# Patient Record
Sex: Female | Born: 1935 | Race: White | Hispanic: No | Smoking: Former smoker
Health system: Southern US, Community
[De-identification: ages and names within clinical notes are randomized; demographics above are authoritative.]

## PROBLEM LIST (undated history)

## (undated) DIAGNOSIS — J449 Chronic obstructive pulmonary disease, unspecified: Secondary | ICD-10-CM

## (undated) DIAGNOSIS — F329 Major depressive disorder, single episode, unspecified: Secondary | ICD-10-CM

## (undated) DIAGNOSIS — G459 Transient cerebral ischemic attack, unspecified: Secondary | ICD-10-CM

## (undated) DIAGNOSIS — K219 Gastro-esophageal reflux disease without esophagitis: Secondary | ICD-10-CM

## (undated) DIAGNOSIS — R0902 Hypoxemia: Secondary | ICD-10-CM

## (undated) DIAGNOSIS — F32A Depression, unspecified: Secondary | ICD-10-CM

## (undated) DIAGNOSIS — F419 Anxiety disorder, unspecified: Secondary | ICD-10-CM

## (undated) DIAGNOSIS — E46 Unspecified protein-calorie malnutrition: Secondary | ICD-10-CM

## (undated) DIAGNOSIS — Z9981 Dependence on supplemental oxygen: Secondary | ICD-10-CM

## (undated) DIAGNOSIS — S72143A Displaced intertrochanteric fracture of unspecified femur, initial encounter for closed fracture: Secondary | ICD-10-CM

## (undated) HISTORY — PX: CARDIAC ELECTROPHYSIOLOGY STUDY AND ABLATION: SHX1294

## (undated) HISTORY — PX: APPENDECTOMY: SHX54

## (undated) HISTORY — PX: CERVICAL CONE BIOPSY: SUR198

## (undated) HISTORY — PX: CATARACT EXTRACTION: SUR2

## (undated) HISTORY — PX: TUBAL LIGATION: SHX77

## (undated) HISTORY — DX: Displaced intertrochanteric fracture of unspecified femur, initial encounter for closed fracture: S72.143A

## (undated) HISTORY — DX: Hypoxemia: R09.02

---
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
## 2006-04-03 ENCOUNTER — Ambulatory Visit (HOSPITAL_COMMUNITY): Admission: RE | Admit: 2006-04-03 | Discharge: 2006-04-03 | Payer: Self-pay | Admitting: Obstetrics & Gynecology

## 2013-07-22 ENCOUNTER — Other Ambulatory Visit (INDEPENDENT_AMBULATORY_CARE_PROVIDER_SITE_OTHER): Payer: Self-pay | Admitting: *Deleted

## 2013-07-22 ENCOUNTER — Inpatient Hospital Stay (HOSPITAL_COMMUNITY): Admission: RE | Admit: 2013-07-22 | Payer: Self-pay | Source: Ambulatory Visit

## 2013-07-22 ENCOUNTER — Telehealth (INDEPENDENT_AMBULATORY_CARE_PROVIDER_SITE_OTHER): Payer: Self-pay | Admitting: *Deleted

## 2013-07-22 ENCOUNTER — Encounter (HOSPITAL_COMMUNITY): Payer: Self-pay | Admitting: Pharmacy Technician

## 2013-07-22 ENCOUNTER — Encounter (INDEPENDENT_AMBULATORY_CARE_PROVIDER_SITE_OTHER): Payer: Self-pay

## 2013-07-22 DIAGNOSIS — K222 Esophageal obstruction: Secondary | ICD-10-CM

## 2013-07-22 NOTE — Telephone Encounter (Signed)
agree

## 2013-07-22 NOTE — Telephone Encounter (Signed)
  Procedure: egd/ed w/ propofol  per Tyler Deis  Reason/Indication:  Esophageal stricture   Has patient had this procedure before?  Yes, 07/2013  If so, when, by whom and where?    Is there a family history of colon cancer?    Who?  What age when diagnosed?    Is patient diabetic?   no      Does patient have prosthetic heart valve?  no  Do you have a pacemaker?  no  Has patient ever had endocarditis? no  Has patient had joint replacement within last 12 months?  no  Does patient tend to be constipated or take laxatives? no  Is patient on Coumadin, Plavix and/or Aspirin? yes  Medications: asa 81 mg daily, omeprazole daily, Xanax daily, vitamins, mirtazapine daily  Allergies: nkda  Medication Adjustment: asa 2 days  Procedure date & time: 07/24/13 at 730

## 2013-07-23 ENCOUNTER — Encounter (HOSPITAL_COMMUNITY)
Admission: RE | Admit: 2013-07-23 | Discharge: 2013-07-23 | Disposition: A | Discharge status: Home / Self Care | Payer: No Typology Code available for payment source | Source: Ambulatory Visit | Attending: Internal Medicine | Admitting: Internal Medicine

## 2013-07-23 ENCOUNTER — Encounter (HOSPITAL_COMMUNITY): Payer: Self-pay

## 2013-07-23 ENCOUNTER — Other Ambulatory Visit: Payer: Self-pay

## 2013-07-23 VITALS — BP 113/72 | HR 103 | Temp 98.1°F | Resp 22 | Ht 68.0 in | Wt 108.0 lb

## 2013-07-23 DIAGNOSIS — K222 Esophageal obstruction: Secondary | ICD-10-CM

## 2013-07-23 HISTORY — DX: Major depressive disorder, single episode, unspecified: F32.9

## 2013-07-23 HISTORY — DX: Gastro-esophageal reflux disease without esophagitis: K21.9

## 2013-07-23 HISTORY — DX: Dependence on supplemental oxygen: Z99.81

## 2013-07-23 HISTORY — DX: Depression, unspecified: F32.A

## 2013-07-23 HISTORY — DX: Chronic obstructive pulmonary disease, unspecified: J44.9

## 2013-07-23 LAB — BASIC METABOLIC PANEL
BUN: 5 mg/dL — ABNORMAL LOW (ref 6–23)
CO2: 32 mEq/L (ref 19–32)
Calcium: 10.1 mg/dL (ref 8.4–10.5)
Chloride: 97 mEq/L (ref 96–112)
Creatinine, Ser: 0.76 mg/dL (ref 0.50–1.10)
GFR calc Af Amer: >90 mL/min (ref >90)
GFR calc non Af Amer: 79 mL/min — ABNORMAL LOW (ref >90)
Glucose, Bld: 109 mg/dL — ABNORMAL HIGH (ref 70–99)
Potassium: 5.3 mEq/L — ABNORMAL HIGH (ref 3.5–5.1)
Sodium: 137 mEq/L (ref 135–145)

## 2013-07-23 LAB — HEMOGLOBIN AND HEMATOCRIT, BLOOD
HCT: 47.3 % — ABNORMAL HIGH (ref 36.0–46.0)
Hemoglobin: 15.2 g/dL — ABNORMAL HIGH (ref 12.0–15.0)

## 2013-07-23 NOTE — Patient Instructions (Signed)
Taylor Cannon  07/23/2013   Your procedure is scheduled on:  07/24/13  Report to [facility] at 06:15 AM.  Call this number if you have problems the morning of surgery: [phone]   Remember:   Do not eat food or drink liquids after midnight.   Take these medicines the morning of surgery with A SIP OF WATER: Mirtazapine and Omeprazole.   Do not wear jewelry, make-up or nail polish.  Do not wear lotions, powders, or perfumes. You may wear deodorant.  Do not shave 48 hours prior to surgery. Men may shave face and neck.  Do not bring valuables to the hospital.  [facility] is not responsible for any belongings or valuables.               Contacts, dentures or bridgework may not be worn into surgery.  Leave suitcase in the car. After surgery it may be brought to your room.  For patients admitted to the hospital, discharge time is determined by your treatment team.               Patients discharged the day of surgery will not be allowed to drive home.    Special Instructions: N/A   Please read over the following fact sheets that you were given: Anesthesia Post-op Instructions    Esophagogastroduodenoscopy Esophagogastroduodenoscopy (EGD) is a procedure to examine the lining of the esophagus, stomach, and first part of the small intestine (duodenum). A long, flexible, lighted tube with a camera attached (endoscope) is inserted down the throat to view these organs. This procedure is done to detect problems or abnormalities, such as inflammation, bleeding, ulcers, or growths, in order to treat them. The procedure lasts about 5 20 minutes. It is usually an outpatient procedure, but it may need to be performed in emergency cases in the hospital. LET YOUR CAREGIVER KNOW ABOUT:   Allergies to food or medicine.  All medicines you are taking, including vitamins, herbs, eyedrops, and over-the-counter medicines and creams.  Use of steroids (by mouth or creams).  Previous problems you or  members of your family have had with the use of anesthetics.  Any blood disorders you have.  Previous surgeries you have had.  Other health problems you have.  Possibility of pregnancy, if this applies. RISKS AND COMPLICATIONS  Generally, EGD is a safe procedure. However, as with any procedure, complications can occur. Possible complications include:  Infection.  Bleeding.  Tearing (perforation) of the esophagus, stomach, or duodenum.  Difficulty breathing or not being able to breath.  Excessive sweating.  Spasms of the larynx.  Slowed heartbeat.  Low blood pressure. BEFORE THE PROCEDURE  Do not eat or drink anything for 6 8 hours before the procedure or as directed by your caregiver.  Ask your caregiver about changing or stopping your regular medicines.  If you wear dentures, be prepared to remove them before the procedure.  Arrange for someone to drive you home after the procedure. PROCEDURE   A vein will be accessed to give medicines and fluids. A medicine to relax you (sedative) and a pain reliever will be given through that access into the vein.  A numbing medicine (local anesthetic) may be sprayed on your throat for comfort and to stop you from gagging or coughing.  A mouth guard may be placed in your mouth to protect your teeth and to keep you from biting on the endoscope.  You will be asked to lie on your left side.  The endoscope  is inserted down your throat and into the esophagus, stomach, and duodenum.  Air is put through the endoscope to allow your caregiver to view the lining of your esophagus clearly.  The esophagus, stomach, and duodenum is then examined. During the exam, your caregiver may:  Remove tissue to be examined under a microscope (biopsy) for inflammation, infection, or other medical problems.  Remove growths.  Remove objects (foreign bodies) that are stuck.  Treat any bleeding with medicines or other devices that stop tissues from  bleeding (hot cauters, clipping devices).  Widen (dilate) or stretch narrowed areas of the esophagus and stomach.  The endoscope will then be withdrawn. AFTER THE PROCEDURE  You will be taken to a recovery area to be monitored. You will be able to go home once you are stable and alert.  Do not eat or drink anything until the local anesthetic and numbing medicines have worn off. You may choke.  It is normal to feel bloated, have pain with swallowing, or have a sore throat for a short time. This will wear off.  Your caregiver should be able to discuss his or her findings with you. It will take longer to discuss the test results if any biopsies were taken. Document Released: 12/21/2004 Document Revised: 08/06/2012 Document Reviewed: 07/23/2012 [facility] Patient Information 2014 [place], [place].    Esophageal Dilatation The esophagus is the long, narrow tube which carries food and liquid from the mouth to the stomach. Esophageal dilatation is the technique used to stretch a blocked or narrowed portion of the esophagus. This procedure is used when a part of the esophagus has become so narrow that it becomes difficult, painful or even impossible to swallow. This is generally an uncomplicated form of treatment. When this is not successful, chest surgery may be required. This is a much more extensive form of treatment with a longer recovery time. CAUSES  Some of the more common causes of blockage or strictures of the esophagus are:  Narrowing from longstanding inflammation (soreness and redness) of the lower esophagus. This comes from the constant exposure of the lower esophagus to the acid which bubbles up from the stomach. Over time this causes scarring and narrowing of the lower esophagus.  Hiatal hernia in which a small part of the stomach bulges (herniates) up through the diaphragm. This can cause a gradual narrowing of the end of the esophagus.  Schatzki's Ring is a narrow ring of benign  (non-cancerous) fibrous tissue which constricts the lower esophagus. The reason for this is not known.  Scleroderma is a connective tissue disorder that affects the esophagus and makes swallowing difficult.  Achalasia is an absence of nerves to the lower esophagus and to the esophageal sphincter. This is the circular muscle between the stomach and esophagus that relaxes to allow food into the stomach. After swallowing, it contracts to keep food in the stomach. This absence of nerves may be congenital (present since birth). This can cause irregular spasms of the lower esophageal muscle. This spasm does not open up to allow food and fluid through. The result is a persistent blockage with subsequent slow trickling of the esophageal contents into the stomach.  Strictures may develop from swallowing materials which damage the esophagus. Some examples are strong acids or alkalis such as lye.  Growths such as benign (non-cancerous) and malignant (cancerous) tumors can block the esophagus.  Heredity (present since birth) causes. DIAGNOSIS  Your caregiver often suspects this problem by taking a medical history. They will also do  a physical exam. They can then prove their suspicions using X-rays and endoscopy. Endoscopy is an exam in which a tube like a small flexible telescope is used to look at your esophagus.  TREATMENT There are different stretching (dilating) techniques which can be used. Simple bougie dilatation may be done in the office. This usually takes only a couple minutes. A numbing (anesthetic) spray of the throat is used. Endoscopy, when done, is done in an endoscopy suite, under mild sedation. When fluoroscopy is used, the procedure is performed in X-ray. Other techniques require a little longer time. Recovery is usually quick. There is no waiting time to begin eating and drinking to test success of the treatment. Following are some of the methods used. Narrowing of the esophagus is treated by  making it bigger. Commonly this is a mechanical problem which can be treated with stretching. This can be done in different ways. Your caregiver will discuss these with you. Some of the means used are:  A series of graduated (increasing thickness) flexible dilators can be used. These are weighted tubes passed through the esophagus into the stomach. The tubes used become progressively larger until the desired stretched size is reached. Graduated dilators are a simple and quick way of opening the esophagus. No visualization is required.  Another method is the use of endoscopy to place a flexible wire across the stricture. The endoscope is removed and the wire left in place. A dilator with a hole through it from end to end is guided down the esophagus and across the stricture. One or more of these dilators are passed over the wire. At the end of the exam, the wire is removed. This type of treatment may be performed in the X-ray department under fluoroscopy. An advantage of this procedure is the examiner is visualizing the end opening in the esophagus.  Stretching of the esophagus may be done using balloons. Deflated balloons are placed through the endoscope and across the stricture. This type of balloon dilatation is often done at the time of endoscopy or fluoroscopy. Flexible endoscopy allows the examiner to directly view the stricture. A balloon is inserted in the deflated form into the area of narrowing. It is then inflated with air to a certain pressure that is pre-set for a given circumference. When inflated, it becomes sausage shaped, stretched, and makes the stricture larger.  Achalasia requires a longer larger balloon-type dilator. This is frequently done under X-ray control. In this situation, the spastic muscle fibers in the lower esophagus are stretched. All of the above procedures make the passage of food and water into the stomach easier. They also make it easier for stomach contents to reflux  back into the esophagus. Special medications may be used following the procedure to help prevent further stricturing. Proton-pump inhibitor medications are good at decreasing the amount of acid in the stomach juice. When stomach juice refluxes into the esophagus, the juice is no longer as acidic and is less likely to burn or scar the esophagus. RISKS AND COMPLICATIONS Esophageal dilatation is usually performed effectively and without problems. Some complications that can occur are:  A small amount of bleeding almost always happens where the stretching takes place. If this is too excessive it may require more aggressive treatment.  An uncommon complication is perforation (making a hole) of the esophagus. The esophagus is thin. It is easy to make a hole in it. If this happens, an operation may be necessary to repair this.  A small, undetected perforation  could lead to an infection in the chest. This can be very serious. HOME CARE INSTRUCTIONS   If you received sedation for your procedure, do not drive, make important decisions, or perform any activities requiring your full coordination. Do not drink alcohol, take sedatives, or use any mind altering chemicals unless instructed by your caregiver.  You may use throat lozenges or warm salt water gargles if you have throat discomfort  You can begin eating and drinking normally on return home unless instructed otherwise. Do not purposely try to force large chunks of food down to test the benefits of your procedure.  Mild discomfort can be eased with sips of ice water.  Medications for discomfort may or may not be needed. SEEK IMMEDIATE MEDICAL CARE IF:   You begin vomiting up blood.  You develop black tarry stools  You develop chills or an unexplained temperature of over 101 F (38.3 C)  You develop chest or abdominal pain.  You develop shortness of breath or feel lightheaded or faint.  Your swallowing is becoming more painful, difficult, or  you are unable to swallow. MAKE SURE YOU:   Understand these instructions.  Will watch your condition.  Will get help right away if you are not doing well or get worse. Document Released: 10/11/2005 Document Revised: 11/12/2011 Document Reviewed: 11/28/2005 [facility] Patient Information 2014 [place], [place].    PATIENT INSTRUCTIONS POST-ANESTHESIA  IMMEDIATELY FOLLOWING SURGERY:  Do not drive or operate machinery for the first twenty four hours after surgery.  Do not make any important decisions for twenty four hours after surgery or while taking narcotic pain medications or sedatives.  If you develop intractable nausea and vomiting or a severe headache please notify your doctor immediately.  FOLLOW-UP:  Please make an appointment with your surgeon as instructed. You do not need to follow up with anesthesia unless specifically instructed to do so.  WOUND CARE INSTRUCTIONS (if applicable):  Keep a dry clean dressing on the anesthesia/puncture wound site if there is drainage.  Once the wound has quit draining you may leave it open to air.  Generally you should leave the bandage intact for twenty four hours unless there is drainage.  If the epidural site drains for more than 36-48 hours please call the anesthesia department.  QUESTIONS?:  Please feel free to call your physician or the hospital operator if you have any questions, and they will be happy to assist you.

## 2013-07-24 ENCOUNTER — Ambulatory Visit (HOSPITAL_COMMUNITY): Payer: No Typology Code available for payment source | Admitting: Anesthesiology

## 2013-07-24 ENCOUNTER — Ambulatory Visit (HOSPITAL_COMMUNITY): Payer: No Typology Code available for payment source

## 2013-07-24 ENCOUNTER — Encounter (HOSPITAL_COMMUNITY)
Admission: RE | Disposition: A | Discharge status: Home / Self Care | Payer: Self-pay | Source: Ambulatory Visit | Attending: Internal Medicine

## 2013-07-24 ENCOUNTER — Encounter (HOSPITAL_COMMUNITY): Payer: No Typology Code available for payment source | Admitting: Anesthesiology

## 2013-07-24 ENCOUNTER — Ambulatory Visit (HOSPITAL_COMMUNITY)
Admission: RE | Admit: 2013-07-24 | Discharge: 2013-07-24 | Disposition: A | Discharge status: Home / Self Care | Payer: No Typology Code available for payment source | Source: Ambulatory Visit | Attending: Internal Medicine | Admitting: Internal Medicine

## 2013-07-24 DIAGNOSIS — J4489 Other specified chronic obstructive pulmonary disease: Secondary | ICD-10-CM | POA: Insufficient documentation

## 2013-07-24 DIAGNOSIS — J449 Chronic obstructive pulmonary disease, unspecified: Secondary | ICD-10-CM | POA: Insufficient documentation

## 2013-07-24 DIAGNOSIS — Z9981 Dependence on supplemental oxygen: Secondary | ICD-10-CM | POA: Insufficient documentation

## 2013-07-24 DIAGNOSIS — Z01812 Encounter for preprocedural laboratory examination: Secondary | ICD-10-CM | POA: Insufficient documentation

## 2013-07-24 DIAGNOSIS — K449 Diaphragmatic hernia without obstruction or gangrene: Secondary | ICD-10-CM

## 2013-07-24 DIAGNOSIS — K222 Esophageal obstruction: Secondary | ICD-10-CM

## 2013-07-24 HISTORY — PX: ESOPHAGOGASTRODUODENOSCOPY (EGD) WITH PROPOFOL: SHX5813

## 2013-07-24 HISTORY — PX: BALLOON DILATION: SHX5330

## 2013-07-24 LAB — POTASSIUM: Potassium: 4.7 mEq/L (ref 3.5–5.1)

## 2013-07-24 SURGERY — ESOPHAGOGASTRODUODENOSCOPY (EGD) WITH PROPOFOL
Anesthesia: Monitor Anesthesia Care

## 2013-07-24 MED ORDER — MIDAZOLAM HCL 2 MG/2ML IJ SOLN
1.0000 mg | INTRAMUSCULAR | Status: DC | PRN
  Administered 2013-07-24: 2 mg via INTRAVENOUS

## 2013-07-24 MED ORDER — LIDOCAINE HCL (PF) 1 % IJ SOLN
INTRAMUSCULAR | Status: AC
  Filled 2013-07-24: qty 5

## 2013-07-24 MED ORDER — MIDAZOLAM HCL 2 MG/2ML IJ SOLN
INTRAMUSCULAR | Status: AC
  Filled 2013-07-24: qty 2

## 2013-07-24 MED ORDER — PROPOFOL INFUSION 10 MG/ML OPTIME
INTRAVENOUS | Status: DC | PRN
  Administered 2013-07-24: 100 ug/kg/min via INTRAVENOUS

## 2013-07-24 MED ORDER — ONDANSETRON HCL 4 MG/2ML IJ SOLN
INTRAMUSCULAR | Status: AC
  Filled 2013-07-24: qty 2

## 2013-07-24 MED ORDER — BUTAMBEN-TETRACAINE-BENZOCAINE 2-2-14 % EX AERO
INHALATION_SPRAY | CUTANEOUS | Status: DC | PRN
  Administered 2013-07-24: 2 via TOPICAL

## 2013-07-24 MED ORDER — FENTANYL CITRATE 0.05 MG/ML IJ SOLN: 25.0000 ug | INTRAMUSCULAR | Status: DC | PRN

## 2013-07-24 MED ORDER — FENTANYL CITRATE 0.05 MG/ML IJ SOLN
INTRAMUSCULAR | Status: DC | PRN
  Administered 2013-07-24 (×3): 12.5 ug via INTRAVENOUS
  Administered 2013-07-24: 25 ug via INTRAVENOUS
  Administered 2013-07-24: 12.5 ug via INTRAVENOUS

## 2013-07-24 MED ORDER — GLYCOPYRROLATE 0.2 MG/ML IJ SOLN
INTRAMUSCULAR | Status: AC
  Filled 2013-07-24: qty 1

## 2013-07-24 MED ORDER — ONDANSETRON HCL 4 MG/2ML IJ SOLN
4.0000 mg | Freq: Once | INTRAMUSCULAR | Status: AC
  Administered 2013-07-24: 4 mg via INTRAVENOUS

## 2013-07-24 MED ORDER — GLYCOPYRROLATE 0.2 MG/ML IJ SOLN
0.2000 mg | Freq: Once | INTRAMUSCULAR | Status: AC
  Administered 2013-07-24: 0.2 mg via INTRAVENOUS

## 2013-07-24 MED ORDER — LACTATED RINGERS IV SOLN
INTRAVENOUS | Status: DC
  Administered 2013-07-24: 08:00:00 via INTRAVENOUS

## 2013-07-24 MED ORDER — FENTANYL CITRATE 0.05 MG/ML IJ SOLN
INTRAMUSCULAR | Status: AC
  Filled 2013-07-24: qty 2

## 2013-07-24 MED ORDER — ONDANSETRON HCL 4 MG/2ML IJ SOLN: 4.0000 mg | Freq: Once | INTRAMUSCULAR | Status: DC | PRN

## 2013-07-24 MED ORDER — PROPOFOL 10 MG/ML IV BOLUS
INTRAVENOUS | Status: AC
  Filled 2013-07-24: qty 20

## 2013-07-24 MED ORDER — FENTANYL CITRATE 0.05 MG/ML IJ SOLN
25.0000 ug | INTRAMUSCULAR | Status: AC
  Administered 2013-07-24: 25 ug via INTRAVENOUS

## 2013-07-24 MED ORDER — STERILE WATER FOR IRRIGATION IR SOLN
Status: DC | PRN
  Administered 2013-07-24: 08:00:00

## 2013-07-24 MED ORDER — MINERAL OIL PO OIL
TOPICAL_OIL | ORAL | Status: AC
  Filled 2013-07-24: qty 30

## 2013-07-24 SURGICAL SUPPLY — 12 items
BALLN DILATOR CRE 12-15 240 (BALLOONS) ×2
BALLOON DILATOR CRE 12-15 240 (BALLOONS) IMPLANT
BLOCK BITE 60FR ADLT L/F BLUE (MISCELLANEOUS) ×2 IMPLANT
FLOOR PAD 36X40 (MISCELLANEOUS) ×2
KIT CLEAN ENDO COMPLIANCE (KITS) ×2 IMPLANT
MANIFOLD NEPTUNE II (INSTRUMENTS) ×2 IMPLANT
PAD FLOOR 36X40 (MISCELLANEOUS) ×1 IMPLANT
SYR 50ML LL SCALE MARK (SYRINGE) ×1 IMPLANT
SYR INFLATION 60ML (SYRINGE) ×1 IMPLANT
TUBING ENDO SMARTCAP PENTAX (MISCELLANEOUS) ×2 IMPLANT
TUBING IRRIGATION ENDOGATOR (MISCELLANEOUS) ×2 IMPLANT
WATER STERILE IRR 1000ML POUR (IV SOLUTION) ×1 IMPLANT

## 2013-07-24 NOTE — Anesthesia Postprocedure Evaluation (Signed)
Anesthesia Post Note  Patient: Taylor Cannon  Procedure(s) Performed: Procedure(s) (LRB): ESOPHAGOGASTRODUODENOSCOPY (EGD) WITH PROPOFOL UNDER FLUOROSCOPY (N/A) BALLOON DILATION to 13.60mm (N/A)  Anesthesia type: General  Patient location: PACU  Post pain: Pain level controlled  Post assessment: Post-op Vital signs reviewed, Patient's Cardiovascular Status Stable, Respiratory Function Stable, Patent Airway, No signs of Nausea or vomiting and Pain level controlled  Last Vitals:  Filed Vitals:   07/24/13 0849  BP: 109/61  Pulse: 89  Temp: 36.5 C  Resp: 14    Post vital signs: Reviewed and stable  Level of consciousness: awake and alert   Complications: No apparent anesthesia complications

## 2013-07-24 NOTE — Op Note (Signed)
EGD PROCEDURE REPORT  PATIENT:  Taylor Cannon  MR#:  [id number] Birthdate:  February 14, 1934, 79 y.o., female Endoscopist:  Dr. Malissa Hippo, MD Referred By:  Dr. Sammie Bench, MD Procedure Date: 07/24/2013  Procedure:   EGD with ED under fluoroscopy.  Indications:  Patient is 79 year old Caucasian female who is high-grade esophageal stricture and is undergoing therapeutic EGD under fluoroscopy.            Informed Consent:  The risks, benefits, alternatives & imponderables which include, but are not limited to, bleeding, infection, perforation, drug reaction and potential missed lesion have been reviewed.  The potential for biopsy, lesion removal, esophageal dilation, etc. have also been discussed.  Questions have been answered.  All parties agreeable.  Please see history & physical in medical record for more information.  Medications:  Monitored anesthesia care; please see anesthesia records for details. Cetacaine spray topically for oropharyngeal anesthesia  Description of procedure:  The endoscope was introduced through the mouth and advanced to the second portion of the duodenum without difficulty or limitations. The mucosal surfaces were surveyed very carefully during advancement of the scope and upon withdrawal.   Findings:  Esophagus: High grade a ringlike stricture noted at GE junction; Lumen estimated to be 7 mm. GEJ:  35 cm Hiatus:  40 cm Stomach:  Examination of the stomach was undertaken once esophageal stricture was dilated. Folds in the proximal stomach are normal. Examination mucosa gastric body, antrum, pyloric channel angularis fundus and cardia was normal. Hernia was easily seen on retroflexed view. Duodenum:  Normal bulbar and post bulbar mucosa.  Therapeutic/Diagnostic Maneuvers Performed:   Pediatric Pentax videoscope could not be passed across the stricture. The guidewire was passed across the stricture stayed in herniated part of the stomach. This scope was  exchanged with Q-scope. Since I was able to see gastric mucosa past the stricture, it was dilated with a balloon to 12 mm. Once I was able to pass the scope into the stomach guidewire was advanced again to distal part of the stomach. The stricture was dilated again to 13.5 mm. Is resulted in mucosal disruption at stricture site. Post dilation I was able to pass the scope across it and minimal resistance noted. Endoscope was withdrawn.  Complications:  None  Impression: High-grade distal esophageal stricture dilated to 13.5 mm using balloon dilator and fluoroscopy. Moderate size hiatal hernia.   Recommendations:  Patient will continue anti-reflex measures and PPI. She will return for repeat dilation in two weeks.  REHMAN,NAJEEB U  07/24/2013  8:46 AM  CC: Dr. Clarise Cruz, Taylor Cannon & Dr. No ref. provider found

## 2013-07-24 NOTE — H&P (Signed)
Taylor Cannon is an 79 y.o. female.   Chief Complaint: Patient's here for EGD and ED. HPI: Patient is 79 year old Caucasian female who has history of esophageal stricture and underwent esophageal dilation twice by Dr. Halina Maidens of [facility]. She did fine until 2 weeks ago when she began to have difficulty with all types of solids. She was seen by Dr. Aleene Davidson and underwent EGD 2 days ago. She was noted to have high grade stricture and he could not even pass 24 Taylor Cannon dilator. Patient has different than referred for EGD with dilation possibly over the guidewire. He felt she may have eosinophilic esophagitis but biopsies are not back as of 4 PM yesterday. Patient has been on therapy for GERD although she's not had heartburn or regurgitation frequently. She has lost a few pounds recently. She says after she quit cigarette smoking she gained 270 pounds but then has gone back to cigarette smoking in now down to 108 pounds. She has COPD. She uses O2 at night.  Past Medical History  Diagnosis Date  . GERD (gastroesophageal reflux disease)   . Depression   . COPD (chronic obstructive pulmonary disease)   . On home oxygen therapy     2L via [place] at night    Past Surgical History  Procedure Laterality Date  . Appendectomy    . Cervical cone biopsy    . Tubal ligation    . Cardiac electrophysiology study and ablation    . Cataract extraction Bilateral     No family history on file. Social History:  reports that she has been smoking Cigarettes.  She has a 55 pack-year smoking history. She does not have any smokeless tobacco history on file. She reports that she drinks about 8.4 ounces of alcohol per week. She reports that she does not use illicit drugs.  Allergies:  Allergies  Allergen Reactions  . Other Nausea Only    Steroids    Medications Prior to Admission  Medication Sig Dispense Refill  . ALPRAZolam (XANAX) 0.5 MG tablet Take 0.5 mg by mouth at bedtime as needed  for anxiety or sleep.      Marland Kitchen aspirin EC 81 MG tablet Take 81 mg by mouth daily.      Marland Kitchen CALCIUM PO Take 1 tablet by mouth daily.      . mirtazapine (REMERON) 15 MG tablet Take 15 mg by mouth daily.      . Multiple Vitamins-Minerals (MULTIVITAMINS THER. W/MINERALS) TABS tablet Take 1 tablet by mouth daily.      Marland Kitchen omeprazole (PRILOSEC) 20 MG capsule Take 20 mg by mouth daily.      . vitamin C (ASCORBIC ACID) 500 MG tablet Take 500 mg by mouth daily.        Results for orders placed during the hospital encounter of 07/24/13 (from the past 48 hour(s))  POTASSIUM     Status: None   Collection Time    07/24/13  6:30 AM      Result Value Range   Potassium 4.7  3.5 - 5.1 mEq/L   No results found.  ROS  Blood pressure 112/71, pulse 82, temperature 97.7 F (36.5 C), temperature source Oral, resp. rate 18, SpO2 93.00%. Physical Exam  Constitutional:  Well-developed thin Caucasian female in NAD  HENT:  Mouth/Throat: Oropharynx is clear and moist.  She has upper and lower dentures in place.  Eyes: Conjunctivae are normal. No scleral icterus.  Neck: No thyromegaly present.  Cardiovascular: Normal rate, regular rhythm  and normal heart sounds.   No murmur heard. Respiratory: Effort normal and breath sounds normal.  GI: Soft. She exhibits no distension and no mass. There is no tenderness.  Musculoskeletal: She exhibits no edema.  Lymphadenopathy:    She has no cervical adenopathy.  Neurological: She is alert.  Skin: Skin is warm and dry.     Assessment/Plan High grade esophageal stricture. EGD with esophageal dilation under fluoroscopy with Savary system.   Krystelle Prashad U 07/24/2013, 7:32 AM

## 2013-07-24 NOTE — Transfer of Care (Signed)
Immediate Anesthesia Transfer of Care Note  Patient: Taylor Cannon  Procedure(s) Performed: Procedure(s) (LRB): ESOPHAGOGASTRODUODENOSCOPY (EGD) WITH PROPOFOL UNDER FLUOROSCOPY (N/A) BALLOON DILATION to 13.10mm (N/A)  Patient Location: PACU  Anesthesia Type: MAC  Level of Consciousness: awake  Airway & Oxygen Therapy: Patient Spontanous Breathing. Nasal cannula  Post-op Assessment: Report given to PACU RN, Post -op Vital signs reviewed and stable and Patient moving all extremities  Post vital signs: Reviewed and stable  Complications: No apparent anesthesia complications

## 2013-07-24 NOTE — Anesthesia Preprocedure Evaluation (Signed)
Anesthesia Evaluation  Patient identified by MRN, date of birth, ID band  Reviewed: Allergy & Precautions, H&P , NPO status , Patient's Chart, lab work & pertinent test results  Airway Mallampati: I TM Distance: >3 FB Neck ROM: Full    Dental  (+) Edentulous Upper and Edentulous Lower   Pulmonary COPD oxygen dependent, Current Smoker,  breath sounds clear to auscultation        Cardiovascular negative cardio ROS  Rhythm:Regular Rate:Normal     Neuro/Psych PSYCHIATRIC DISORDERS Depression    GI/Hepatic GERD-  Medicated,  Endo/Other    Renal/GU      Musculoskeletal   Abdominal   Peds  Hematology   Anesthesia Other Findings   Reproductive/Obstetrics                           Anesthesia Physical Anesthesia Plan  ASA: III  Anesthesia Plan: MAC   Post-op Pain Management:    Induction: Intravenous  Airway Management Planned: Simple Face Mask  Additional Equipment:   Intra-op Plan:   Post-operative Plan:   Informed Consent: I have reviewed the patients History and Physical, chart, labs and discussed the procedure including the risks, benefits and alternatives for the proposed anesthesia with the patient or authorized representative who has indicated his/her understanding and acceptance.     Plan Discussed with:   Anesthesia Plan Comments:         Anesthesia Quick Evaluation

## 2013-07-24 NOTE — Anesthesia Procedure Notes (Signed)
Procedure Name: MAC Date/Time: 07/24/2013 7:49 AM Performed by: Franco Nones Pre-anesthesia Checklist: Patient identified, Emergency Drugs available, Suction available, Timeout performed and Patient being monitored Patient Re-evaluated:Patient Re-evaluated prior to inductionOxygen Delivery Method: Non-rebreather mask

## 2013-07-28 ENCOUNTER — Encounter (INDEPENDENT_AMBULATORY_CARE_PROVIDER_SITE_OTHER): Payer: Self-pay | Admitting: *Deleted

## 2013-07-28 ENCOUNTER — Other Ambulatory Visit (INDEPENDENT_AMBULATORY_CARE_PROVIDER_SITE_OTHER): Payer: Self-pay | Admitting: *Deleted

## 2013-07-28 DIAGNOSIS — R1310 Dysphagia, unspecified: Secondary | ICD-10-CM

## 2013-07-29 ENCOUNTER — Encounter (HOSPITAL_COMMUNITY): Payer: Self-pay | Admitting: Internal Medicine

## 2013-08-12 ENCOUNTER — Encounter (HOSPITAL_COMMUNITY): Payer: Self-pay | Admitting: Pharmacy Technician

## 2013-08-20 ENCOUNTER — Encounter (HOSPITAL_COMMUNITY)
Admission: RE | Disposition: A | Discharge status: Home / Self Care | Payer: Self-pay | Source: Ambulatory Visit | Attending: Internal Medicine

## 2013-08-20 ENCOUNTER — Encounter (HOSPITAL_COMMUNITY): Payer: Self-pay | Admitting: *Deleted

## 2013-08-20 ENCOUNTER — Ambulatory Visit (HOSPITAL_COMMUNITY)
Admission: RE | Admit: 2013-08-20 | Discharge: 2013-08-20 | Disposition: A | Discharge status: Home / Self Care | Payer: No Typology Code available for payment source | Source: Ambulatory Visit | Attending: Internal Medicine | Admitting: Internal Medicine

## 2013-08-20 DIAGNOSIS — J449 Chronic obstructive pulmonary disease, unspecified: Secondary | ICD-10-CM | POA: Insufficient documentation

## 2013-08-20 DIAGNOSIS — K449 Diaphragmatic hernia without obstruction or gangrene: Secondary | ICD-10-CM | POA: Insufficient documentation

## 2013-08-20 DIAGNOSIS — Z7982 Long term (current) use of aspirin: Secondary | ICD-10-CM | POA: Insufficient documentation

## 2013-08-20 DIAGNOSIS — R1310 Dysphagia, unspecified: Secondary | ICD-10-CM | POA: Insufficient documentation

## 2013-08-20 DIAGNOSIS — J4489 Other specified chronic obstructive pulmonary disease: Secondary | ICD-10-CM | POA: Insufficient documentation

## 2013-08-20 DIAGNOSIS — K222 Esophageal obstruction: Secondary | ICD-10-CM | POA: Insufficient documentation

## 2013-08-20 HISTORY — PX: ESOPHAGOGASTRODUODENOSCOPY (EGD) WITH ESOPHAGEAL DILATION: SHX5812

## 2013-08-20 SURGERY — ESOPHAGOGASTRODUODENOSCOPY (EGD) WITH ESOPHAGEAL DILATION
Anesthesia: Moderate Sedation

## 2013-08-20 MED ORDER — MEPERIDINE HCL 25 MG/ML IJ SOLN
INTRAMUSCULAR | Status: DC | PRN
  Administered 2013-08-20 (×2): 20 mg via INTRAVENOUS

## 2013-08-20 MED ORDER — MIDAZOLAM HCL 5 MG/5ML IJ SOLN
INTRAMUSCULAR | Status: DC | PRN
  Administered 2013-08-20: 2 mg via INTRAVENOUS
  Administered 2013-08-20 (×3): 1 mg via INTRAVENOUS

## 2013-08-20 MED ORDER — STERILE WATER FOR IRRIGATION IR SOLN
Status: DC | PRN
  Administered 2013-08-20: 08:00:00

## 2013-08-20 MED ORDER — SODIUM CHLORIDE 0.9 % IV SOLN
INTRAVENOUS | Status: DC
  Administered 2013-08-20: 1000 mL via INTRAVENOUS

## 2013-08-20 MED ORDER — MEPERIDINE HCL 50 MG/ML IJ SOLN
INTRAMUSCULAR | Status: AC
  Filled 2013-08-20: qty 1

## 2013-08-20 MED ORDER — BUTAMBEN-TETRACAINE-BENZOCAINE 2-2-14 % EX AERO
INHALATION_SPRAY | CUTANEOUS | Status: DC | PRN
  Administered 2013-08-20: 2 via TOPICAL

## 2013-08-20 MED ORDER — MIDAZOLAM HCL 5 MG/5ML IJ SOLN
INTRAMUSCULAR | Status: AC
  Filled 2013-08-20: qty 10

## 2013-08-20 NOTE — H&P (Signed)
Taylor Cannon is an 79 y.o. female.   Chief Complaint: Patient is here for EGD and ED. HPI: Patient is 79 year old Caucasian female with history of high grade distal esophageal strictures secondary to chronic GERD whose last esophageal dilation was on 07/24/2013 when the stricture was dilated to 13.5 mm. She is able to swallow much better. She's returning for repeat dilation of the dilated stricture to 16.5 mm. She states her heartburn, controlled with therapy. She denies dyspnea at rest. She uses nasal O2 at night but not during the daytime.  Past Medical History  Diagnosis Date  . GERD (gastroesophageal reflux disease)   . Depression   . COPD (chronic obstructive pulmonary disease)   . On home oxygen therapy     2L via  at night    Past Surgical History  Procedure Laterality Date  . Appendectomy    . Cervical cone biopsy    . Tubal ligation    . Cardiac electrophysiology study and ablation    . Cataract extraction Bilateral   . Esophagogastroduodenoscopy (egd) with propofol N/A 07/24/2013    Procedure: ESOPHAGOGASTRODUODENOSCOPY (EGD) WITH PROPOFOL UNDER FLUOROSCOPY;  Surgeon: Malissa Hippo, MD;  Location: [facility];  Service: Endoscopy;  Laterality: N/A;  . Balloon dilation N/A 07/24/2013    Procedure: BALLOON DILATION to 13.68mm;  Surgeon: Malissa Hippo, MD;  Location: [facility];  Service: Endoscopy;  Laterality: N/A;    History reviewed. No pertinent family history. Social History:  reports that she has been smoking Cigarettes.  She has a 55 pack-year smoking history. She does not have any smokeless tobacco history on file. She reports that she drinks about 8.4 ounces of alcohol per week. She reports that she does not use illicit drugs.  Allergies:  Allergies  Allergen Reactions  . Other Nausea Only    Steroids    Medications Prior to Admission  Medication Sig Dispense Refill  . ALPRAZolam (XANAX) 0.5 MG tablet Take 0.5 mg by mouth at bedtime as needed for anxiety or  sleep.      Marland Kitchen aspirin EC 81 MG tablet Take 81 mg by mouth daily.      Marland Kitchen CALCIUM PO Take 1 tablet by mouth daily.      . mirtazapine (REMERON) 15 MG tablet Take 15 mg by mouth daily.      . Multiple Vitamins-Minerals (MULTIVITAMINS THER. W/MINERALS) TABS tablet Take 1 tablet by mouth daily.      Marland Kitchen omeprazole (PRILOSEC) 20 MG capsule Take 20 mg by mouth daily.      . vitamin C (ASCORBIC ACID) 500 MG tablet Take 500 mg by mouth daily.        No results found for this or any previous visit (from the past 48 hour(s)). No results found.  ROS  Blood pressure 129/74, pulse 81, temperature 97.6 F (36.4 C), temperature source Oral, resp. rate 15, SpO2 100.00%. Physical Exam  Constitutional:  Well-developed thin Caucasian female in NAD  HENT:  Mouth/Throat: Oropharynx is clear and moist.  Eyes: Conjunctivae are normal. No scleral icterus.  Neck: No thyromegaly present.  Cardiovascular: Normal rate, regular rhythm and normal heart sounds.   No murmur heard. Respiratory:  Barrel-shaped chest with diminished intensity of breath sounds bilaterally. No rales or rhonchi noted  GI: Soft. She exhibits no distension and no mass. There is no tenderness.  Musculoskeletal:  Thin extremities without edema or clubbing  Lymphadenopathy:    She has no cervical adenopathy.  Neurological: She is alert.  Skin: Skin is warm and dry.     Assessment/Plan Dysphagia. High-grade distal esophageal stricture. EGD with ED.  Darthula Desa U 08/20/2013, 7:39 AM

## 2013-08-20 NOTE — Op Note (Addendum)
EGD PROCEDURE REPORT  PATIENT:  Taylor Cannon  MR#:  [id number] Birthdate:  1934/04/16, 79 y.o., female Endoscopist:  Dr. Malissa Hippo, MD Referred By:  Dr. Sammie Bench, MD  Procedure Date: 08/20/2013  Procedure:   EGD with ED.  Indications:  Patient is 79 year old Caucasian female with chronic GERD and known high-grade distal esophageal stricture. The stricture was last dilated to 13.5 mm 4 weeks ago. She is returning for repeat dilation. Patient reports significant improvement in dysphagia.            Informed Consent:  The risks, benefits, alternatives & imponderables which include, but are not limited to, bleeding, infection, perforation, drug reaction and potential missed lesion have been reviewed.  The potential for biopsy, lesion removal, esophageal dilation, etc. have also been discussed.  Questions have been answered.  All parties agreeable.  Please see history & physical in medical record for more information.  Medications:  Demerol 40 mg IV Versed 5 mg IV Cetacaine spray topically for oropharyngeal anesthesia  Description of procedure:  The endoscope was introduced through the mouth and advanced to the second portion of the duodenum without difficulty or limitations. The mucosal surfaces were surveyed very carefully during advancement of the scope and upon withdrawal.  Findings:  Esophagus:  Mucosa of the esophagus was normal. High-grade ringlike stricture noted at GE junction not allowing passage of the scope the gastric mucosa was well seen distally. Balloon dilator was advanced across the stricture into herniated part of the stomach under direct vision the balloon was insufflated to 15 mm. Once the stricture was dilated endoscope was passed distally. GEJ:  33 cm Hiatus:  40 cm Stomach:  Stomach was empty and distended very well with insufflation. Folds in the proximal stomach were unremarkable. Gastric mucosa at body, antrum, pyloric channel, angularis fundus and cardia  was unremarkable. Hernia was easily seen on this view. Duodenum:  Normal bulbar and post bulbar mucosa.  Therapeutic/Diagnostic Maneuvers Performed:  Distal esophageal stricture was redilated with a balloon dilator. The guidewire was pushed into the gastric lumen. The lumen dilator was positioned across the stricture Vivatron scope in the body of the esophagus and the stricture was dilated to 15 mm. Once the balloon was deflated withdrawn I was able to pass the scope across the stricture with minimal resistance.  Complications:  None  Impression: High-grade stricture at GE junction dilated to 15 mm with a balloon. Please note I was not able to pass Q-scope across the stricture prior to dilation. Large sliding hiatal hernia.  Recommendations:  Hold aspirin for 3 days. Repeat dilation on an as needed basis.   REHMAN,NAJEEB U  08/20/2013  8:11 AM  CC: Dr. Clarise Cruz, [name] & Dr. No ref. provider found CC Dr. Sammie Bench, MD

## 2013-08-24 ENCOUNTER — Encounter (HOSPITAL_COMMUNITY): Payer: Self-pay | Admitting: Internal Medicine

## 2013-11-05 ENCOUNTER — Encounter (HOSPITAL_COMMUNITY): Payer: Self-pay | Admitting: Anesthesiology

## 2014-10-05 ENCOUNTER — Other Ambulatory Visit (INDEPENDENT_AMBULATORY_CARE_PROVIDER_SITE_OTHER): Payer: Self-pay | Admitting: *Deleted

## 2014-10-05 ENCOUNTER — Ambulatory Visit (INDEPENDENT_AMBULATORY_CARE_PROVIDER_SITE_OTHER): Payer: Medicare Other | Admitting: Internal Medicine

## 2014-10-05 ENCOUNTER — Encounter (INDEPENDENT_AMBULATORY_CARE_PROVIDER_SITE_OTHER): Payer: Self-pay | Admitting: Internal Medicine

## 2014-10-05 ENCOUNTER — Encounter (INDEPENDENT_AMBULATORY_CARE_PROVIDER_SITE_OTHER): Payer: Self-pay | Admitting: *Deleted

## 2014-10-05 VITALS — BP 122/50 | HR 68 | Temp 97.8°F | Ht 68.0 in | Wt 108.5 lb

## 2014-10-05 DIAGNOSIS — R1310 Dysphagia, unspecified: Secondary | ICD-10-CM

## 2014-10-05 DIAGNOSIS — R1314 Dysphagia, pharyngoesophageal phase: Secondary | ICD-10-CM

## 2014-10-05 DIAGNOSIS — H8110 Benign paroxysmal vertigo, unspecified ear: Secondary | ICD-10-CM | POA: Insufficient documentation

## 2014-10-05 HISTORY — DX: Dysphagia, pharyngoesophageal phase: R13.14

## 2014-10-05 HISTORY — DX: Benign paroxysmal vertigo, unspecified ear: H81.10

## 2014-10-05 NOTE — Progress Notes (Signed)
Subjective:    Patient ID: Taylor Cannon, female    DOB: 09-May-1934, 81 y.o.   MRN: [id number]  HPI Presents today with c/o dysphagia.  She is having trouble swallowing oatmeal and toast.  If she tries to swallow something large, it feels like her esophagus closes. Breads give her a lot of trouble. Appetite is not good.  No weight loss in past 6 months.  Acid reflux controlled with Omperazole. She usually has a BM daily.  Hx of EGD/ED. Last EGD/ED in 2014   08/20/2013: Procedure: EGD with ED.  Indications: Patient is 81 year old Caucasian female with chronic GERD and known high-grade distal esophageal stricture. The stricture was last dilated to 13.5 mm 4 weeks ago. She is returning for repeat dilation. Patient reports significant improvement in dysphagia. Impression: High-grade stricture at GE junction dilated to 15 mm with a balloon. Please note I was not able to pass Q-scope across the stricture prior to dilation. Large sliding hiatal hernia.    Procedure:07/24/2013 EGD with ED under fluoroscopy.  Indications: Patient is 81 year old Caucasian female who is high-grade esophageal stricture and is undergoing therapeutic EGD under fluoroscopy.  Impression: High-grade distal esophageal stricture dilated to 13.5 mm using balloon dilator and fluoroscopy. Moderate size hiatal hernia.  Review of Systems Past Medical History  Diagnosis Date  . GERD (gastroesophageal reflux disease)   . Depression   . COPD (chronic obstructive pulmonary disease)   . On home oxygen therapy     2L via [place] at night    Past Surgical History  Procedure Laterality Date  . Appendectomy    . Cervical cone biopsy    . Tubal ligation    . Cardiac electrophysiology study and ablation    . Cataract extraction Bilateral   .  Esophagogastroduodenoscopy (egd) with propofol N/A 07/24/2013    Procedure: ESOPHAGOGASTRODUODENOSCOPY (EGD) WITH PROPOFOL UNDER FLUOROSCOPY;  Surgeon: Rogene Houston, MD;  Location: [facility];  Service: Endoscopy;  Laterality: N/A;  . Balloon dilation N/A 07/24/2013    Procedure: BALLOON DILATION to 13.44mm;  Surgeon: Rogene Houston, MD;  Location: [facility];  Service: Endoscopy;  Laterality: N/A;  . Esophagogastroduodenoscopy (egd) with esophageal dilation N/A 08/20/2013    Procedure: ESOPHAGOGASTRODUODENOSCOPY (EGD) WITH ESOPHAGEAL DILATION;  Surgeon: Rogene Houston, MD;  Location: [facility];  Service: Endoscopy;  Laterality: N/A;  345-moved to [place] notified pt    Allergies  Allergen Reactions  . Other Nausea Only    Steroids    Current Outpatient Prescriptions on File Prior to Visit  Medication Sig Dispense Refill  . ALPRAZolam (XANAX) 0.5 MG tablet Take 0.5 mg by mouth at bedtime as needed for anxiety or sleep.    Marland Kitchen CALCIUM PO Take 1 tablet by mouth daily.    . mirtazapine (REMERON) 15 MG tablet Take 15 mg by mouth daily.    . Multiple Vitamins-Minerals (MULTIVITAMINS THER. W/MINERALS) TABS tablet Take 1 tablet by mouth daily.    Marland Kitchen omeprazole (PRILOSEC) 20 MG capsule Take 20 mg by mouth daily.    . vitamin C (ASCORBIC ACID) 500 MG tablet Take 500 mg by mouth daily.     No current facility-administered medications on file prior to visit.        Objective:   Physical Exam  Filed Vitals:   10/05/14 1007  Height: 5\' 8"  (1.727 m)  Weight: 108 lb 8 oz (49.215 kg)  Alert and oriented. Skin warm and dry. Oral mucosa is moist.   . Sclera  anicteric, conjunctivae is pink. Thyroid not enlarged. No cervical lymphadenopathy. Lungs clear. Heart regular rate and rhythm.  Abdomen is soft. Bowel sounds are positive. No hepatomegaly. No abdominal masses felt. No tenderness.  No edema to lower extremities.           Assessment & Plan:  Solid food dyphagia. EGD/ED. The risks and benefits  such as perforation, bleeding, and infection were reviewed with the patient and is agreeable.

## 2014-10-05 NOTE — Patient Instructions (Signed)
EGD/ED. The risks and benefits such as perforation, bleeding, and infection were reviewed with the patient and is agreeable.The risks and benefits such as perforation, bleeding, and infection were reviewed with the patient and is agreeable.

## 2014-11-11 ENCOUNTER — Encounter (INDEPENDENT_AMBULATORY_CARE_PROVIDER_SITE_OTHER): Payer: Self-pay | Admitting: *Deleted

## 2014-11-11 ENCOUNTER — Encounter (HOSPITAL_COMMUNITY): Payer: Self-pay | Admitting: *Deleted

## 2014-11-11 ENCOUNTER — Ambulatory Visit (HOSPITAL_COMMUNITY)
Admission: RE | Admit: 2014-11-11 | Discharge: 2014-11-11 | Disposition: A | Discharge status: Home / Self Care | Payer: Medicare Other | Source: Ambulatory Visit | Attending: Internal Medicine | Admitting: Internal Medicine

## 2014-11-11 ENCOUNTER — Other Ambulatory Visit (INDEPENDENT_AMBULATORY_CARE_PROVIDER_SITE_OTHER): Payer: Self-pay | Admitting: *Deleted

## 2014-11-11 ENCOUNTER — Encounter (HOSPITAL_COMMUNITY)
Admission: RE | Disposition: A | Discharge status: Home / Self Care | Payer: Self-pay | Source: Ambulatory Visit | Attending: Internal Medicine

## 2014-11-11 DIAGNOSIS — K222 Esophageal obstruction: Secondary | ICD-10-CM | POA: Diagnosis not present

## 2014-11-11 DIAGNOSIS — J449 Chronic obstructive pulmonary disease, unspecified: Secondary | ICD-10-CM | POA: Insufficient documentation

## 2014-11-11 DIAGNOSIS — Z9851 Tubal ligation status: Secondary | ICD-10-CM | POA: Diagnosis not present

## 2014-11-11 DIAGNOSIS — K449 Diaphragmatic hernia without obstruction or gangrene: Secondary | ICD-10-CM | POA: Insufficient documentation

## 2014-11-11 DIAGNOSIS — Z7982 Long term (current) use of aspirin: Secondary | ICD-10-CM | POA: Diagnosis not present

## 2014-11-11 DIAGNOSIS — F329 Major depressive disorder, single episode, unspecified: Secondary | ICD-10-CM | POA: Insufficient documentation

## 2014-11-11 DIAGNOSIS — K219 Gastro-esophageal reflux disease without esophagitis: Secondary | ICD-10-CM

## 2014-11-11 DIAGNOSIS — F17210 Nicotine dependence, cigarettes, uncomplicated: Secondary | ICD-10-CM | POA: Diagnosis not present

## 2014-11-11 DIAGNOSIS — Z888 Allergy status to other drugs, medicaments and biological substances status: Secondary | ICD-10-CM | POA: Insufficient documentation

## 2014-11-11 DIAGNOSIS — R1310 Dysphagia, unspecified: Secondary | ICD-10-CM

## 2014-11-11 HISTORY — PX: ESOPHAGOGASTRODUODENOSCOPY: SHX5428

## 2014-11-11 HISTORY — PX: BALLOON DILATION: SHX5330

## 2014-11-11 SURGERY — EGD (ESOPHAGOGASTRODUODENOSCOPY)
Anesthesia: Moderate Sedation

## 2014-11-11 MED ORDER — BUTAMBEN-TETRACAINE-BENZOCAINE 2-2-14 % EX AERO
INHALATION_SPRAY | CUTANEOUS | Status: DC | PRN
  Administered 2014-11-11: 2 via TOPICAL

## 2014-11-11 MED ORDER — MEPERIDINE HCL 50 MG/ML IJ SOLN
INTRAMUSCULAR | Status: AC
  Filled 2014-11-11: qty 1

## 2014-11-11 MED ORDER — SODIUM CHLORIDE 0.9 % IV SOLN
INTRAVENOUS | Status: DC
  Administered 2014-11-11: 10:00:00 via INTRAVENOUS

## 2014-11-11 MED ORDER — MIDAZOLAM HCL 5 MG/5ML IJ SOLN
INTRAMUSCULAR | Status: AC
  Filled 2014-11-11: qty 10

## 2014-11-11 MED ORDER — MIDAZOLAM HCL 5 MG/5ML IJ SOLN
INTRAMUSCULAR | Status: DC | PRN
Start: 2014-11-11 — End: 2014-11-11
  Administered 2014-11-11 (×3): 1 mg via INTRAVENOUS
  Administered 2014-11-11 (×2): 2 mg via INTRAVENOUS

## 2014-11-11 MED ORDER — MEPERIDINE HCL 50 MG/ML IJ SOLN
INTRAMUSCULAR | Status: DC | PRN
  Administered 2014-11-11 (×2): 20 mg via INTRAVENOUS
  Administered 2014-11-11: 10 mg via INTRAVENOUS

## 2014-11-11 MED ORDER — STERILE WATER FOR IRRIGATION IR SOLN
Status: DC | PRN
  Administered 2014-11-11: 10:00:00

## 2014-11-11 NOTE — Discharge Instructions (Signed)
Resume usual medications and diet. No driving for 24 hours. Repeat dilation in 2 weeks.  Esophagogastroduodenoscopy Care After Refer to this sheet in the next few weeks. These instructions provide you with information on caring for yourself after your procedure. Your caregiver may also give you more specific instructions. Your treatment has been planned according to current medical practices, but problems sometimes occur. Call your caregiver if you have any problems or questions after your procedure.  HOME CARE INSTRUCTIONS  Do not eat or drink anything until the numbing medicine (local anesthetic) has worn off and your gag reflex has returned. You will know that the local anesthetic has worn off when you can swallow comfortably.  Do not drive for 12 hours after the procedure or as directed by your caregiver.  Only take medicines as directed by your caregiver. SEEK MEDICAL CARE IF:   You cannot stop coughing.  You are not urinating at all or less than usual. SEEK IMMEDIATE MEDICAL CARE IF:  You have difficulty swallowing.  You cannot eat or drink.  You have worsening throat or chest pain.  You have dizziness, lightheadedness, or you faint.  You have nausea or vomiting.  You have chills.  You have a fever.  You have severe abdominal pain.  You have black, tarry, or bloody stools. Document Released: 08/06/2012 Document Reviewed: 08/06/2012 [facility] Patient Information 2015 [place]. This information is not intended to replace advice given to you by your health care provider. Make sure you discuss any questions you have with your health care provider.

## 2014-11-11 NOTE — Op Note (Signed)
EGD PROCEDURE REPORT  PATIENT:  Taylor Cannon  MR#:  [id number] Birthdate:  August 12, 1934, 81 y.o., female Endoscopist:  Dr. Rogene Houston, MD  Procedure Date: 11/11/2014  Procedure:   EGD with ED  Indications:  Patient is 81 year old Caucasian female was chronic GERD complicated by distal esophageal stricture which was lost dilated in November and December 2014 who presents with 3-4 month history of solid food dysphagia.            Informed Consent:  The risks, benefits, alternatives & imponderables which include, but are not limited to, bleeding, infection, perforation, drug reaction and potential missed lesion have been reviewed.  The potential for biopsy, lesion removal, esophageal dilation, etc. have also been discussed.  Questions have been answered.  All parties agreeable.  Please see history & physical in medical record for more information.  Medications:  Demerol 50 mg IV Versed 7 mg IV Cetacaine spray topically for oropharyngeal anesthesia  Description of procedure:  The endoscope was introduced through the mouth and advanced to the second portion of the duodenum without difficulty or limitations. The mucosal surfaces were surveyed very carefully during advancement of the scope and upon withdrawal.  Findings:  Esophagus:  Mucosa of the esophagus was normal. High-grade stricture was noted at GE junction measuring 4-5 mm in diameter and scope could not be passed across it and I was able to see the gastric mucosa. GEJ:  33 cm Hiatus:  40 cm Stomach:  Stomach was empty and distended very well with insufflation. Folds in the proximal stomach were normal. Examination mucosa and body, antrum, pyloric channel, fundus and cardia was normal. Hernia was easily examined on this view. Duodenum:  Not examined.  Therapeutic/Diagnostic Maneuvers Performed:   Q scope could not be passed across across the stricture. Balloon dilator was placed across the stricture and dilated partially about 9 or  10 mm. Balloon dilator was withdrawn. I was still unable to pass Q scope distally. The scope was therefore removed and Slim scope fit outer diameter of 7 mm was passed and advanced across the stricture without any difficulty.  Complications:  None  Impression: High-grade stricture at GE junction which was dilated with balloon dilator to about 9 or 10 mm. Moderate to large sliding hiatal hernia. Patient is difficult to sedate.  Recommendations:  Repeat dilation in 2 weeks under fluoroscopy with monitored anesthesia care.  Dajaun Goldring U  11/11/2014  11:14 AM  CC: Dr. Salli Real, [name] & Dr. No ref. provider found

## 2014-11-11 NOTE — H&P (Signed)
Taylor Cannon is an 81 y.o. female.   Chief Complaint: Patient's here for EGD and ED. HPI: Patient is 81 year old Caucasian female who was chronic GERD complicated by esophageal stricture who presents with 3-4 month history of intermittent solid food dysphagia. She's had a few episodes of food impaction relieved spontaneously. Stricture was dilated to 13.5 mm in November 2014 and 2:15 millimeters in December 2014. She rarely has heartburn while on medication. She does not have a good appetite but she has not lost weight. She continues to smoke cigarettes.  Past Medical History  Diagnosis Date  . GERD (gastroesophageal reflux disease)   . Depression   . COPD (chronic obstructive pulmonary disease)   . On home oxygen therapy     2L via [place] at night    Past Surgical History  Procedure Laterality Date  . Appendectomy    . Cervical cone biopsy    . Tubal ligation    . Cardiac electrophysiology study and ablation    . Cataract extraction Bilateral   . Esophagogastroduodenoscopy (egd) with propofol N/A 07/24/2013    Procedure: ESOPHAGOGASTRODUODENOSCOPY (EGD) WITH PROPOFOL UNDER FLUOROSCOPY;  Surgeon: Taylor Houston, MD;  Location: [facility];  Service: Endoscopy;  Laterality: N/A;  . Balloon dilation N/A 07/24/2013    Procedure: BALLOON DILATION to 13.60mm;  Surgeon: Taylor Houston, MD;  Location: [facility];  Service: Endoscopy;  Laterality: N/A;  . Esophagogastroduodenoscopy (egd) with esophageal dilation N/A 08/20/2013    Procedure: ESOPHAGOGASTRODUODENOSCOPY (EGD) WITH ESOPHAGEAL DILATION;  Surgeon: Taylor Houston, MD;  Location: [facility];  Service: Endoscopy;  Laterality: N/A;  345-moved to [place] notified pt    History reviewed. No pertinent family history. Social History:  reports that she has been smoking Cigarettes.  She has a 55 pack-year smoking history. She does not have any smokeless tobacco history on file. She reports that she drinks about 8.4 oz of alcohol per week. She  reports that she does not use illicit drugs.  Allergies:  Allergies  Allergen Reactions  . Other Nausea Only    Steroids    Medications Prior to Admission  Medication Sig Dispense Refill  . ALPRAZolam (XANAX) 0.25 MG tablet Take 0.25 mg by mouth 2 (two) times daily as needed for anxiety.    Marland Kitchen aspirin 81 MG tablet Take 81 mg by mouth daily.    . calcium-vitamin D (OSCAL WITH D) 500-200 MG-UNIT per tablet Take 1 tablet by mouth daily with breakfast.    . mirtazapine (REMERON) 15 MG tablet Take 15 mg by mouth daily.    . Multiple Vitamins-Minerals (MULTIVITAMINS THER. W/MINERALS) TABS tablet Take 1 tablet by mouth daily.    Marland Kitchen omeprazole (PRILOSEC) 20 MG capsule Take 20 mg by mouth daily.    . vitamin C (ASCORBIC ACID) 500 MG tablet Take 500 mg by mouth daily.      No results found for this or any previous visit (from the past 48 hour(s)). No results found.  ROS  Blood pressure 126/64, pulse 98, temperature 97.5 F (36.4 C), temperature source Oral, resp. rate 18, height 5\' 8"  (1.727 m), weight 112 lb (50.803 kg), SpO2 90 %. Physical Exam  Constitutional:  Well-developed and Caucasian female in NAD.  HENT:  Mouth/Throat: Oropharynx is clear and moist.  She has upper and lower dentures.  Eyes: Conjunctivae are normal. No scleral icterus.  Neck: No thyromegaly present.  Cardiovascular: Normal rate, regular rhythm and normal heart sounds.   No murmur heard. Respiratory:  Pectum carinatum  GI: Soft. She exhibits no distension and no mass. There is no tenderness.  Musculoskeletal: She exhibits no edema.  Lymphadenopathy:    She has no cervical adenopathy.  Neurological: She is alert.  Skin: Skin is warm and dry.     Assessment/Plan Chronic GERD complicated by distal esophageal stricture. Known large hiatal hernia. EGD with ED.  Taylor Cannon 11/11/2014, 10:33 AM

## 2014-11-12 ENCOUNTER — Encounter (HOSPITAL_COMMUNITY): Payer: Self-pay | Admitting: Internal Medicine

## 2014-11-29 ENCOUNTER — Encounter (HOSPITAL_COMMUNITY)
Admission: RE | Admit: 2014-11-29 | Discharge: 2014-11-29 | Disposition: A | Discharge status: Home / Self Care | Payer: Medicare Other | Source: Ambulatory Visit | Attending: Internal Medicine | Admitting: Internal Medicine

## 2014-11-29 ENCOUNTER — Other Ambulatory Visit: Payer: Self-pay

## 2014-11-29 ENCOUNTER — Encounter (HOSPITAL_COMMUNITY): Payer: Self-pay

## 2014-11-29 ENCOUNTER — Encounter (INDEPENDENT_AMBULATORY_CARE_PROVIDER_SITE_OTHER): Payer: Self-pay | Admitting: *Deleted

## 2014-11-29 ENCOUNTER — Other Ambulatory Visit (HOSPITAL_COMMUNITY): Payer: No Typology Code available for payment source

## 2014-11-29 ENCOUNTER — Other Ambulatory Visit (INDEPENDENT_AMBULATORY_CARE_PROVIDER_SITE_OTHER): Payer: Self-pay | Admitting: *Deleted

## 2014-11-29 VITALS — Ht 68.0 in | Wt 112.0 lb

## 2014-11-29 DIAGNOSIS — K219 Gastro-esophageal reflux disease without esophagitis: Secondary | ICD-10-CM | POA: Diagnosis not present

## 2014-11-29 DIAGNOSIS — K2960 Other gastritis without bleeding: Secondary | ICD-10-CM | POA: Diagnosis not present

## 2014-11-29 DIAGNOSIS — K222 Esophageal obstruction: Secondary | ICD-10-CM

## 2014-11-29 DIAGNOSIS — Z7982 Long term (current) use of aspirin: Secondary | ICD-10-CM | POA: Diagnosis not present

## 2014-11-29 DIAGNOSIS — Z9089 Acquired absence of other organs: Secondary | ICD-10-CM | POA: Diagnosis not present

## 2014-11-29 DIAGNOSIS — Z8673 Personal history of transient ischemic attack (TIA), and cerebral infarction without residual deficits: Secondary | ICD-10-CM | POA: Diagnosis not present

## 2014-11-29 DIAGNOSIS — F17210 Nicotine dependence, cigarettes, uncomplicated: Secondary | ICD-10-CM | POA: Diagnosis not present

## 2014-11-29 DIAGNOSIS — Z9889 Other specified postprocedural states: Secondary | ICD-10-CM | POA: Diagnosis not present

## 2014-11-29 DIAGNOSIS — Z9981 Dependence on supplemental oxygen: Secondary | ICD-10-CM | POA: Diagnosis not present

## 2014-11-29 DIAGNOSIS — F329 Major depressive disorder, single episode, unspecified: Secondary | ICD-10-CM | POA: Diagnosis not present

## 2014-11-29 DIAGNOSIS — Z79899 Other long term (current) drug therapy: Secondary | ICD-10-CM | POA: Diagnosis not present

## 2014-11-29 DIAGNOSIS — J449 Chronic obstructive pulmonary disease, unspecified: Secondary | ICD-10-CM | POA: Diagnosis not present

## 2014-11-29 DIAGNOSIS — K449 Diaphragmatic hernia without obstruction or gangrene: Secondary | ICD-10-CM | POA: Diagnosis not present

## 2014-11-29 HISTORY — DX: Transient cerebral ischemic attack, unspecified: G45.9

## 2014-11-29 LAB — BASIC METABOLIC PANEL
Anion gap: 8 (ref 5–15)
BUN: 8 mg/dL (ref 6–23)
CO2: 29 mmol/L (ref 19–32)
Calcium: 9.2 mg/dL (ref 8.4–10.5)
Chloride: 97 mmol/L (ref 96–112)
Creatinine, Ser: 0.79 mg/dL (ref 0.50–1.10)
GFR calc Af Amer: >90 mL/min (ref >90)
GFR calc non Af Amer: 78 mL/min — ABNORMAL LOW (ref >90)
Glucose, Bld: 95 mg/dL (ref 70–99)
Potassium: 5.5 mmol/L — ABNORMAL HIGH (ref 3.5–5.1)
Sodium: 134 mmol/L — ABNORMAL LOW (ref 135–145)

## 2014-11-29 LAB — CBC
HCT: 46.5 % — ABNORMAL HIGH (ref 36.0–46.0)
Hemoglobin: 14.7 g/dL (ref 12.0–15.0)
MCH: 24.1 pg — ABNORMAL LOW (ref 26.0–34.0)
MCHC: 31.6 g/dL (ref 30.0–36.0)
MCV: 76.2 fL — ABNORMAL LOW (ref 78.0–100.0)
Platelets: 688 10*3/uL — ABNORMAL HIGH (ref 150–400)
RBC: 6.1 MIL/uL — ABNORMAL HIGH (ref 3.87–5.11)
RDW: 16.9 % — ABNORMAL HIGH (ref 11.5–15.5)
WBC: 8.5 10*3/uL (ref 4.0–10.5)

## 2014-11-29 NOTE — Patient Instructions (Signed)
Taylor Cannon  11/29/2014   Your procedure is scheduled on:  11/30/14  Report to [facility] at 11:30 AM.  Call this number if you have problems the morning of surgery: [phone]   Remember:   Do not eat food or drink liquids after midnight.   Take these medicines the morning of surgery with A SIP OF WATER: Omeprazole. You may take your Xanax if needed.   Do not wear jewelry, make-up or nail polish.  Do not wear lotions, powders, or perfumes. You may wear deodorant.  Do not shave 48 hours prior to surgery. Men may shave face and neck.  Do not bring valuables to the hospital.  [facility] is not responsible for any belongings or valuables.               Contacts, dentures or bridgework may not be worn into surgery.  Leave suitcase in the car. After surgery it may be brought to your room.  For patients admitted to the hospital, discharge time is determined by your treatment team.               Patients discharged the day of surgery will not be allowed to drive home.   Please read over the following fact sheets that you were given: Anesthesia Post-op Instructions    Esophagogastroduodenoscopy Esophagogastroduodenoscopy (EGD) is a procedure to examine the lining of the esophagus, stomach, and first part of the small intestine (duodenum). A long, flexible, lighted tube with a camera attached (endoscope) is inserted down the throat to view these organs. This procedure is done to detect problems or abnormalities, such as inflammation, bleeding, ulcers, or growths, in order to treat them. The procedure lasts about 5-20 minutes. It is usually an outpatient procedure, but it may need to be performed in emergency cases in the hospital. LET YOUR CAREGIVER KNOW ABOUT:   Allergies to food or medicine.  All medicines you are taking, including vitamins, herbs, eyedrops, and over-the-counter medicines and creams.  Use of steroids (by mouth or creams).  Previous problems you or members of your  family have had with the use of anesthetics.  Any blood disorders you have.  Previous surgeries you have had.  Other health problems you have.  Possibility of pregnancy, if this applies. RISKS AND COMPLICATIONS  Generally, EGD is a safe procedure. However, as with any procedure, complications can occur. Possible complications include:  Infection.  Bleeding.  Tearing (perforation) of the esophagus, stomach, or duodenum.  Difficulty breathing or not being able to breath.  Excessive sweating.  Spasms of the larynx.  Slowed heartbeat.  Low blood pressure. BEFORE THE PROCEDURE  Do not eat or drink anything for 6-8 hours before the procedure or as directed by your caregiver.  Ask your caregiver about changing or stopping your regular medicines.  If you wear dentures, be prepared to remove them before the procedure.  Arrange for someone to drive you home after the procedure. PROCEDURE   A vein will be accessed to give medicines and fluids. A medicine to relax you (sedative) and a pain reliever will be given through that access into the vein.  A numbing medicine (local anesthetic) may be sprayed on your throat for comfort and to stop you from gagging or coughing.  A mouth guard may be placed in your mouth to protect your teeth and to keep you from biting on the endoscope.  You will be asked to lie on your left side.  The endoscope is inserted down  your throat and into the esophagus, stomach, and duodenum.  Air is put through the endoscope to allow your caregiver to view the lining of your esophagus clearly.  The esophagus, stomach, and duodenum is then examined. During the exam, your caregiver may:  Remove tissue to be examined under a microscope (biopsy) for inflammation, infection, or other medical problems.  Remove growths.  Remove objects (foreign bodies) that are stuck.  Treat any bleeding with medicines or other devices that stop tissues from bleeding (hot  cautery, clipping devices).  Widen (dilate) or stretch narrowed areas of the esophagus and stomach.  The endoscope will then be withdrawn. AFTER THE PROCEDURE  You will be taken to a recovery area to be monitored. You will be able to go home once you are stable and alert.  Do not eat or drink anything until the local anesthetic and numbing medicines have worn off. You may choke.  It is normal to feel bloated, have pain with swallowing, or have a sore throat for a short time. This will wear off.  Your caregiver should be able to discuss his or her findings with you. It will take longer to discuss the test results if any biopsies were taken. Document Released: 12/21/2004 Document Revised: 01/04/2014 Document Reviewed: 07/23/2012 [facility] Patient Information 2015 [place], [place]. This information is not intended to replace advice given to you by your health care provider. Make sure you discuss any questions you have with your health care provider.    Esophageal Dilatation The esophagus is the long, narrow tube which carries food and liquid from the mouth to the stomach. Esophageal dilatation is the technique used to stretch a blocked or narrowed portion of the esophagus. This procedure is used when a part of the esophagus has become so narrow that it becomes difficult, painful or even impossible to swallow. This is generally an uncomplicated form of treatment. When this is not successful, chest surgery may be required. This is a much more extensive form of treatment with a longer recovery time. CAUSES  Some of the more common causes of blockage or strictures of the esophagus are:  Narrowing from longstanding inflammation (soreness and redness) of the lower esophagus. This comes from the constant exposure of the lower esophagus to the acid which bubbles up from the stomach. Over time this causes scarring and narrowing of the lower esophagus.  Hiatal hernia in which a small part of the stomach  bulges (herniates) up through the diaphragm. This can cause a gradual narrowing of the end of the esophagus.  Schatzki ring is a narrow ring of benign (non-cancerous) fibrous tissue which constricts the lower esophagus. The reason for this is not known.  Scleroderma is a connective tissue disorder that affects the esophagus and makes swallowing difficult.  Achalasia is an absence of nerves to the lower esophagus and to the esophageal sphincter. This is the circular muscle between the stomach and esophagus that relaxes to allow food into the stomach. After swallowing, it contracts to keep food in the stomach. This absence of nerves may be congenital (present since birth). This can cause irregular spasms of the lower esophageal muscle. This spasm does not open up to allow food and fluid through. The result is a persistent blockage with subsequent slow trickling of the esophageal contents into the stomach.  Strictures may develop from swallowing materials which damage the esophagus. Some examples are strong acids or alkalis such as lye.  Growths such as benign (non-cancerous) and malignant (cancerous) tumors can  block the esophagus.  Hereditary (present since birth) causes. DIAGNOSIS  Your caregiver often suspects this problem by taking a medical history. They will also do a physical exam. They can then prove their suspicions using X-rays and endoscopy. Endoscopy is an exam in which a tube like a small, flexible telescope is used to look at your esophagus.  TREATMENT There are different stretching (dilating) techniques that can be used. Simple bougie dilatation may be done in the office. This usually takes only a couple minutes. A numbing (anesthetic) spray of the throat is used. Endoscopy, when done, is done in an endoscopy suite under mild sedation. When fluoroscopy is used, the procedure is performed in X-ray. Other techniques require a little longer time. Recovery is usually quick. There is no  waiting time to begin eating and drinking to test success of the treatment. Following are some of the methods used. Narrowing of the esophagus is treated by making it bigger. Commonly this is a mechanical problem which can be treated with stretching. This can be done in different ways. Your caregiver will discuss these with you. Some of the means used are:  A series of graduated (increasing thickness) flexible dilators can be used. These are weighted tubes passed through the esophagus into the stomach. The tubes used become progressively larger until the desired stretched size is reached. Graduated dilators are a simple and quick way of opening the esophagus. No visualization is required.  Another method is the use of endoscopy to place a flexible wire across the stricture. The endoscope is removed and the wire left in place. A dilator with a hole through it from end to end is guided down the esophagus and across the stricture. One or more of these dilators are passed over the wire. At the end of the exam, the wire is removed. This type of treatment may be performed in the X-ray department under fluoroscopy. An advantage of this procedure is the examiner is visualizing the end opening in the esophagus.  Stretching of the esophagus may be done using balloons. Deflated balloons are placed through the endoscope and across the stricture. This type of balloon dilatation is often done at the time of endoscopy or fluoroscopy. Flexible endoscopy allows the examiner to directly view the stricture. A balloon is inserted in the deflated form into the area of narrowing. It is then inflated with air to a certain pressure that is preset for a given circumference. When inflated, it becomes sausage shaped, stretched, and makes the stricture larger.  Achalasia requires a longer, larger balloon-type dilator. This is frequently done under X-ray control. In this situation, the spastic muscle fibers in the lower esophagus are  stretched. All of the above procedures make the passage of food and water into the stomach easier. They also make it easier for stomach contents to reflux back into the esophagus. Special medications may be used following the procedure to help prevent further stricturing. Proton-pump inhibitor medications are good at decreasing the amount of acid in the stomach juice. When stomach juice refluxes into the esophagus, the juice is no longer as acidic and is less likely to burn or scar the esophagus. RISKS AND COMPLICATIONS Esophageal dilatation is usually performed effectively and without problems. Some complications that can occur are:  A small amount of bleeding almost always happens where the stretching takes place. If this is too excessive it may require more aggressive treatment.  An uncommon complication is perforation (making a hole) of the esophagus. The esophagus is  thin. It is easy to make a hole in it. If this happens, an operation may be necessary to repair this.  A small, undetected perforation could lead to an infection in the chest. This can be very serious. HOME CARE INSTRUCTIONS   If you received sedation for your procedure, do not drive, make important decisions, or perform any activities requiring your full coordination. Do not drink alcohol, take sedatives, or use any mind altering chemicals unless instructed by your caregiver.  You may use throat lozenges or warm salt water gargles if you have throat discomfort.  You can begin eating and drinking normally on return home unless instructed otherwise. Do not purposely try to force large chunks of food down to test the benefits of your procedure.  Mild discomfort can be eased with sips of ice water.  Medications for discomfort may or may not be needed. SEEK IMMEDIATE MEDICAL CARE IF:   You begin vomiting up blood.  You develop black, tarry stools.  You develop chills or an unexplained temperature of over 101F (38.3C)  You  develop chest or abdominal pain.  You develop shortness of breath, or feel light-headed or faint.  Your swallowing is becoming more painful, difficult, or you are unable to swallow. MAKE SURE YOU:   Understand these instructions.  Will watch your condition.  Will get help right away if you are not doing well or get worse. Document Released: 10/11/2005 Document Revised: 01/04/2014 Document Reviewed: 11/28/2005 [facility] Patient Information 2015 [place], [place]. This information is not intended to replace advice given to you by your health care provider. Make sure you discuss any questions you have with your health care provider.    PATIENT INSTRUCTIONS POST-ANESTHESIA  IMMEDIATELY FOLLOWING SURGERY:  Do not drive or operate machinery for the first twenty four hours after surgery.  Do not make any important decisions for twenty four hours after surgery or while taking narcotic pain medications or sedatives.  If you develop intractable nausea and vomiting or a severe headache please notify your doctor immediately.  FOLLOW-UP:  Please make an appointment with your surgeon as instructed. You do not need to follow up with anesthesia unless specifically instructed to do so.  WOUND CARE INSTRUCTIONS (if applicable):  Keep a dry clean dressing on the anesthesia/puncture wound site if there is drainage.  Once the wound has quit draining you may leave it open to air.  Generally you should leave the bandage intact for twenty four hours unless there is drainage.  If the epidural site drains for more than 36-48 hours please call the anesthesia department.  QUESTIONS?:  Please feel free to call your physician or the hospital operator if you have any questions, and they will be happy to assist you.

## 2014-11-30 ENCOUNTER — Ambulatory Visit (HOSPITAL_COMMUNITY)
Admission: RE | Admit: 2014-11-30 | Discharge: 2014-11-30 | Disposition: A | Discharge status: Home / Self Care | Payer: Medicare Other | Source: Ambulatory Visit | Attending: Internal Medicine | Admitting: Internal Medicine

## 2014-11-30 ENCOUNTER — Ambulatory Visit (HOSPITAL_COMMUNITY): Payer: Medicare Other | Admitting: Anesthesiology

## 2014-11-30 ENCOUNTER — Ambulatory Visit (HOSPITAL_COMMUNITY): Payer: Medicare Other

## 2014-11-30 ENCOUNTER — Encounter (HOSPITAL_COMMUNITY)
Admission: RE | Disposition: A | Discharge status: Home / Self Care | Payer: Self-pay | Source: Ambulatory Visit | Attending: Internal Medicine

## 2014-11-30 ENCOUNTER — Ambulatory Visit (HOSPITAL_COMMUNITY): Admission: RE | Admit: 2014-11-30 | Payer: Medicare Other | Source: Ambulatory Visit | Admitting: Internal Medicine

## 2014-11-30 ENCOUNTER — Encounter (HOSPITAL_COMMUNITY): Payer: Self-pay | Admitting: *Deleted

## 2014-11-30 ENCOUNTER — Encounter (HOSPITAL_COMMUNITY): Admission: RE | Payer: Self-pay | Source: Ambulatory Visit

## 2014-11-30 DIAGNOSIS — Z8673 Personal history of transient ischemic attack (TIA), and cerebral infarction without residual deficits: Secondary | ICD-10-CM | POA: Insufficient documentation

## 2014-11-30 DIAGNOSIS — Z9089 Acquired absence of other organs: Secondary | ICD-10-CM | POA: Insufficient documentation

## 2014-11-30 DIAGNOSIS — K2960 Other gastritis without bleeding: Secondary | ICD-10-CM | POA: Diagnosis not present

## 2014-11-30 DIAGNOSIS — R1310 Dysphagia, unspecified: Secondary | ICD-10-CM

## 2014-11-30 DIAGNOSIS — F17210 Nicotine dependence, cigarettes, uncomplicated: Secondary | ICD-10-CM | POA: Insufficient documentation

## 2014-11-30 DIAGNOSIS — K219 Gastro-esophageal reflux disease without esophagitis: Secondary | ICD-10-CM

## 2014-11-30 DIAGNOSIS — K449 Diaphragmatic hernia without obstruction or gangrene: Secondary | ICD-10-CM | POA: Insufficient documentation

## 2014-11-30 DIAGNOSIS — K222 Esophageal obstruction: Secondary | ICD-10-CM

## 2014-11-30 DIAGNOSIS — Z7982 Long term (current) use of aspirin: Secondary | ICD-10-CM | POA: Insufficient documentation

## 2014-11-30 DIAGNOSIS — F329 Major depressive disorder, single episode, unspecified: Secondary | ICD-10-CM | POA: Insufficient documentation

## 2014-11-30 DIAGNOSIS — K2970 Gastritis, unspecified, without bleeding: Secondary | ICD-10-CM | POA: Diagnosis not present

## 2014-11-30 DIAGNOSIS — Z9889 Other specified postprocedural states: Secondary | ICD-10-CM | POA: Insufficient documentation

## 2014-11-30 DIAGNOSIS — Z9981 Dependence on supplemental oxygen: Secondary | ICD-10-CM | POA: Insufficient documentation

## 2014-11-30 DIAGNOSIS — J449 Chronic obstructive pulmonary disease, unspecified: Secondary | ICD-10-CM | POA: Insufficient documentation

## 2014-11-30 DIAGNOSIS — Z79899 Other long term (current) drug therapy: Secondary | ICD-10-CM | POA: Insufficient documentation

## 2014-11-30 HISTORY — PX: ESOPHAGOGASTRODUODENOSCOPY (EGD) WITH PROPOFOL: SHX5813

## 2014-11-30 HISTORY — PX: ESOPHAGEAL DILATION: SHX303

## 2014-11-30 SURGERY — ESOPHAGOGASTRODUODENOSCOPY (EGD) WITH PROPOFOL
Anesthesia: Monitor Anesthesia Care

## 2014-11-30 SURGERY — EGD (ESOPHAGOGASTRODUODENOSCOPY)
Anesthesia: Moderate Sedation

## 2014-11-30 MED ORDER — FENTANYL CITRATE 0.05 MG/ML IJ SOLN
25.0000 ug | INTRAMUSCULAR | Status: AC
  Administered 2014-11-30 (×2): 25 ug via INTRAVENOUS

## 2014-11-30 MED ORDER — SODIUM CHLORIDE 0.9 % IV SOLN: INTRAVENOUS | Status: DC

## 2014-11-30 MED ORDER — FENTANYL CITRATE 0.05 MG/ML IJ SOLN: 25.0000 ug | INTRAMUSCULAR | Status: DC | PRN

## 2014-11-30 MED ORDER — PROPOFOL 10 MG/ML IV BOLUS
INTRAVENOUS | Status: DC | PRN
Start: 2014-11-30 — End: 2014-11-30
  Administered 2014-11-30 (×2): 5 mg via INTRAVENOUS

## 2014-11-30 MED ORDER — ONDANSETRON HCL 4 MG/2ML IJ SOLN
4.0000 mg | Freq: Once | INTRAMUSCULAR | Status: AC
  Administered 2014-11-30: 4 mg via INTRAVENOUS

## 2014-11-30 MED ORDER — PROPOFOL 10 MG/ML IV BOLUS
INTRAVENOUS | Status: AC
  Filled 2014-11-30: qty 20

## 2014-11-30 MED ORDER — STERILE WATER FOR IRRIGATION IR SOLN
Status: DC | PRN
  Administered 2014-11-30: 1000 mL

## 2014-11-30 MED ORDER — LACTATED RINGERS IV SOLN
INTRAVENOUS | Status: DC
  Administered 2014-11-30: 12:00:00 via INTRAVENOUS

## 2014-11-30 MED ORDER — LIDOCAINE HCL (CARDIAC) 10 MG/ML IV SOLN
INTRAVENOUS | Status: DC | PRN
  Administered 2014-11-30: 30 mg via INTRAVENOUS

## 2014-11-30 MED ORDER — PROPOFOL INFUSION 10 MG/ML OPTIME
INTRAVENOUS | Status: DC | PRN
  Administered 2014-11-30: 125 ug/kg/min via INTRAVENOUS

## 2014-11-30 MED ORDER — MIDAZOLAM HCL 2 MG/2ML IJ SOLN
1.0000 mg | INTRAMUSCULAR | Status: DC | PRN
  Administered 2014-11-30: 2 mg via INTRAVENOUS

## 2014-11-30 MED ORDER — ONDANSETRON HCL 4 MG/2ML IJ SOLN: 4.0000 mg | Freq: Once | INTRAMUSCULAR | Status: DC | PRN

## 2014-11-30 MED ORDER — LIDOCAINE VISCOUS 2 % MT SOLN
15.0000 mL | Freq: Once | OROMUCOSAL | Status: AC
  Administered 2014-11-30: 15 mL via OROMUCOSAL

## 2014-11-30 MED ORDER — LIDOCAINE HCL (PF) 1 % IJ SOLN
INTRAMUSCULAR | Status: AC
  Filled 2014-11-30: qty 5

## 2014-11-30 SURGICAL SUPPLY — 31 items
BALLN CRE LF 10-12 240X5.5 (BALLOONS)
BALLN DILATOR CRE 12-15 240 (BALLOONS)
BALLN DILATOR CRE 15-18 240 (BALLOONS) ×1 IMPLANT
BALLN DILATOR CRE 18-20 240 (BALLOONS) IMPLANT
BALLN DILATOR CRE WIREGUIDE (BALLOONS)
BALLOON CRE LF 10-12 240X5.5 (BALLOONS) IMPLANT
BALLOON DILATOR CRE 12-15 240 (BALLOONS) IMPLANT
BALLOON DILATOR CRE WIREGUIDE (BALLOONS) IMPLANT
BLOCK BITE 60FR ADLT L/F BLUE (MISCELLANEOUS) ×1 IMPLANT
ELECT REM PT RETURN 9FT ADLT (ELECTROSURGICAL)
ELECTRODE REM PT RTRN 9FT ADLT (ELECTROSURGICAL) IMPLANT
FLOOR PAD 36X40 (MISCELLANEOUS) ×2
FORCEP COLD BIOPSY (CUTTING FORCEPS) IMPLANT
FORCEPS BIOP RAD 4 LRG CAP 4 (CUTTING FORCEPS) IMPLANT
FORMALIN 10 PREFIL 20ML (MISCELLANEOUS) IMPLANT
KIT CLEAN ENDO COMPLIANCE (KITS) ×1 IMPLANT
LUBRICANT JELLY 4.5OZ STERILE (MISCELLANEOUS) ×1 IMPLANT
MANIFOLD NEPTUNE II (INSTRUMENTS) ×2 IMPLANT
NDL SCLEROTHERAPY 25GX240 (NEEDLE) IMPLANT
NEEDLE SCLEROTHERAPY 25GX240 (NEEDLE) IMPLANT
PAD FLOOR 36X40 (MISCELLANEOUS) IMPLANT
PROBE APC STR FIRE (PROBE) IMPLANT
PROBE INJECTION GOLD (MISCELLANEOUS)
PROBE INJECTION GOLD 7FR (MISCELLANEOUS) IMPLANT
SNARE ROTATE MED OVAL 20MM (MISCELLANEOUS) IMPLANT
SNARE SHORT THROW 13M SML OVAL (MISCELLANEOUS) ×1 IMPLANT
SYR 50ML LL SCALE MARK (SYRINGE) ×1 IMPLANT
SYR INFLATE BILIARY GAUGE (MISCELLANEOUS) IMPLANT
SYR INFLATION 60ML (SYRINGE) ×1 IMPLANT
TUBING IRRIGATION ENDOGATOR (MISCELLANEOUS) ×1 IMPLANT
WATER STERILE IRR 1000ML POUR (IV SOLUTION) ×1 IMPLANT

## 2014-11-30 NOTE — H&P (Signed)
Taylor Cannon is an 81 y.o. female.   Chief Complaint: Patient is here for EGD and ED. HPI: Patient is 81 year old Caucasian female was chronic GERD complicated by distal esophageal stricture who is here for repeat dilation. Most recently this stricture was dilated to about 10 mm. She is not returning for repeat exam under fluoroscopy with propofol. Patient states her swallowing has improved great deal but not normal.  Past Medical History  Diagnosis Date  . GERD (gastroesophageal reflux disease)   . Depression   . COPD (chronic obstructive pulmonary disease)   . On home oxygen therapy     2L via [place] at night  . TIA (transient ischemic attack)     Past Surgical History  Procedure Laterality Date  . Appendectomy    . Cervical cone biopsy    . Tubal ligation    . Cardiac electrophysiology study and ablation    . Cataract extraction Bilateral   . Esophagogastroduodenoscopy (egd) with propofol N/A 07/24/2013    Procedure: ESOPHAGOGASTRODUODENOSCOPY (EGD) WITH PROPOFOL UNDER FLUOROSCOPY;  Surgeon: Rogene Houston, MD;  Location: [facility];  Service: Endoscopy;  Laterality: N/A;  . Balloon dilation N/A 07/24/2013    Procedure: BALLOON DILATION to 13.53m;  Surgeon: NRogene Houston MD;  Location: [facility];  Service: Endoscopy;  Laterality: N/A;  . Esophagogastroduodenoscopy (egd) with esophageal dilation N/A 08/20/2013    Procedure: ESOPHAGOGASTRODUODENOSCOPY (EGD) WITH ESOPHAGEAL DILATION;  Surgeon: NRogene Houston MD;  Location: [facility];  Service: Endoscopy;  Laterality: N/A;  345-moved to 730 Ann notified pt  . Esophagogastroduodenoscopy N/A 11/11/2014    Procedure: ESOPHAGOGASTRODUODENOSCOPY (EGD);  Surgeon: NRogene Houston MD;  Location: [facility];  Service: Endoscopy;  Laterality: N/A;  125-rescheduled 11/11/14 @ 10:30am Ann notified pt  . Balloon dilation N/A 11/11/2014    Procedure: BALLOON DILATION;  Surgeon: NRogene Houston MD;  Location: [facility];  Service: Endoscopy;   Laterality: N/A;    History reviewed. No pertinent family history. Social History:  reports that she has been smoking Cigarettes.  She has a 55 pack-year smoking history. She does not have any smokeless tobacco history on file. She reports that she drinks about 8.4 oz of alcohol per week. She reports that she does not use illicit drugs.  Allergies:  Allergies  Allergen Reactions  . Other Nausea Only    Steroids    Medications Prior to Admission  Medication Sig Dispense Refill  . omeprazole (PRILOSEC) 20 MG capsule Take 20 mg by mouth daily.    .Marland KitchenALPRAZolam (XANAX) 0.25 MG tablet Take 0.25 mg by mouth 2 (two) times daily as needed for anxiety.    .Marland Kitchenaspirin 81 MG tablet Take 81 mg by mouth daily.    . calcium-vitamin D (OSCAL WITH D) 500-200 MG-UNIT per tablet Take 1 tablet by mouth daily with breakfast.    . mirtazapine (REMERON) 15 MG tablet Take 15 mg by mouth daily.    . Multiple Vitamins-Minerals (MULTIVITAMINS THER. W/MINERALS) TABS tablet Take 1 tablet by mouth daily.    . vitamin C (ASCORBIC ACID) 500 MG tablet Take 500 mg by mouth daily.      Results for orders placed or performed during the hospital encounter of 11/29/14 (from the past 48 hour(s))  CBC     Status: Abnormal   Collection Time: 11/29/14  3:20 PM  Result Value Ref Range   WBC 8.5 4.0 - 10.5 K/uL   RBC 6.10 (H) 3.87 - 5.11 MIL/uL  Hemoglobin 14.7 12.0 - 15.0 g/dL   HCT 46.5 (H) 36.0 - 46.0 %   MCV 76.2 (L) 78.0 - 100.0 fL   MCH 24.1 (L) 26.0 - 34.0 pg   MCHC 31.6 30.0 - 36.0 g/dL   RDW 16.9 (H) 11.5 - 15.5 %   Platelets 688 (H) 150 - 400 K/uL  Basic metabolic panel     Status: Abnormal   Collection Time: 11/29/14  3:20 PM  Result Value Ref Range   Sodium 134 (L) 135 - 145 mmol/L   Potassium 5.5 (H) 3.5 - 5.1 mmol/L   Chloride 97 96 - 112 mmol/L   CO2 29 19 - 32 mmol/L   Glucose, Bld 95 70 - 99 mg/dL   BUN 8 6 - 23 mg/dL   Creatinine, Ser 0.79 0.50 - 1.10 mg/dL   Calcium 9.2 8.4 - 10.5 mg/dL   GFR  calc non Af Amer 78 (L) >90 mL/min   GFR calc Af Amer >90 >90 mL/min    Comment: (NOTE) The eGFR has been calculated using the CKD EPI equation. This calculation has not been validated in all clinical situations. eGFR's persistently <90 mL/min signify possible Chronic Kidney Disease.    Anion gap 8 5 - 15   No results found.  ROS  Height _0  (1.727 m), weight 112 lb (50.803 kg). Physical Exam  Constitutional:  Very frail thin Caucasian female in NAD  HENT:  Mouth/Throat: Oropharynx is clear and moist.  Eyes: Conjunctivae are normal. No scleral icterus.  Neck: No thyromegaly present.  Cardiovascular: Normal rate, regular rhythm and normal heart sounds.   No murmur heard. Respiratory: Effort normal and breath sounds normal.  GI: Soft. She exhibits no distension and no mass. There is no tenderness.  Lymphadenopathy:    She has no cervical adenopathy.  Neurological: She is alert.  Skin: Skin is warm.     Assessment/Plan High-grade distal esophageal stricture secondary to GERD. EGD with ED with fluoroscopy under monitored anesthesia care.  REHMAN,NAJEEB U 11/30/2014, 12:54 PM

## 2014-11-30 NOTE — Anesthesia Procedure Notes (Signed)
Procedure Name: MAC Date/Time: 11/30/2014 1:59 PM Performed by: [facility] Pre-anesthesia Checklist: Patient identified, Emergency Drugs available, Suction available, Timeout performed and Patient being monitored Patient Re-evaluated:Patient Re-evaluated prior to inductionOxygen Delivery Method: Non-rebreather mask

## 2014-11-30 NOTE — Discharge Instructions (Signed)
No aspirin or NSAIDs for 3 days. Resume other medications as before. Resume usual diet. No driving for 24 hours. Office visit in 3 months.   Esophageal Dilatation The esophagus is the long, narrow tube which carries food and liquid from the mouth to the stomach. Esophageal dilatation is the technique used to stretch a blocked or narrowed portion of the esophagus. This procedure is used when a part of the esophagus has become so narrow that it becomes difficult, painful or even impossible to swallow. This is generally an uncomplicated form of treatment. When this is not successful, chest surgery may be required. This is a much more extensive form of treatment with a longer recovery time. CAUSES  Some of the more common causes of blockage or strictures of the esophagus are:  Narrowing from longstanding inflammation (soreness and redness) of the lower esophagus. This comes from the constant exposure of the lower esophagus to the acid which bubbles up from the stomach. Over time this causes scarring and narrowing of the lower esophagus.  Hiatal hernia in which a small part of the stomach bulges (herniates) up through the diaphragm. This can cause a gradual narrowing of the end of the esophagus.  Schatzki ring is a narrow ring of benign (non-cancerous) fibrous tissue which constricts the lower esophagus. The reason for this is not known.  Scleroderma is a connective tissue disorder that affects the esophagus and makes swallowing difficult.  Achalasia is an absence of nerves to the lower esophagus and to the esophageal sphincter. This is the circular muscle between the stomach and esophagus that relaxes to allow food into the stomach. After swallowing, it contracts to keep food in the stomach. This absence of nerves may be congenital (present since birth). This can cause irregular spasms of the lower esophageal muscle. This spasm does not open up to allow food and fluid through. The result is a  persistent blockage with subsequent slow trickling of the esophageal contents into the stomach.  Strictures may develop from swallowing materials which damage the esophagus. Some examples are strong acids or alkalis such as lye.  Growths such as benign (non-cancerous) and malignant (cancerous) tumors can block the esophagus.  Hereditary (present since birth) causes. DIAGNOSIS  Your caregiver often suspects this problem by taking a medical history. They will also do a physical exam. They can then prove their suspicions using X-rays and endoscopy. Endoscopy is an exam in which a tube like a small, flexible telescope is used to look at your esophagus.  TREATMENT There are different stretching (dilating) techniques that can be used. Simple bougie dilatation may be done in the office. This usually takes only a couple minutes. A numbing (anesthetic) spray of the throat is used. Endoscopy, when done, is done in an endoscopy suite under mild sedation. When fluoroscopy is used, the procedure is performed in X-ray. Other techniques require a little longer time. Recovery is usually quick. There is no waiting time to begin eating and drinking to test success of the treatment. Following are some of the methods used. Narrowing of the esophagus is treated by making it bigger. Commonly this is a mechanical problem which can be treated with stretching. This can be done in different ways. Your caregiver will discuss these with you. Some of the means used are:  A series of graduated (increasing thickness) flexible dilators can be used. These are weighted tubes passed through the esophagus into the stomach. The tubes used become progressively larger until the desired stretched size is  reached. Graduated dilators are a simple and quick way of opening the esophagus. No visualization is required.  Another method is the use of endoscopy to place a flexible wire across the stricture. The endoscope is removed and the wire  left in place. A dilator with a hole through it from end to end is guided down the esophagus and across the stricture. One or more of these dilators are passed over the wire. At the end of the exam, the wire is removed. This type of treatment may be performed in the X-ray department under fluoroscopy. An advantage of this procedure is the examiner is visualizing the end opening in the esophagus.  Stretching of the esophagus may be done using balloons. Deflated balloons are placed through the endoscope and across the stricture. This type of balloon dilatation is often done at the time of endoscopy or fluoroscopy. Flexible endoscopy allows the examiner to directly view the stricture. A balloon is inserted in the deflated form into the area of narrowing. It is then inflated with air to a certain pressure that is preset for a given circumference. When inflated, it becomes sausage shaped, stretched, and makes the stricture larger.  Achalasia requires a longer, larger balloon-type dilator. This is frequently done under X-ray control. In this situation, the spastic muscle fibers in the lower esophagus are stretched. All of the above procedures make the passage of food and water into the stomach easier. They also make it easier for stomach contents to reflux back into the esophagus. Special medications may be used following the procedure to help prevent further stricturing. Proton-pump inhibitor medications are good at decreasing the amount of acid in the stomach juice. When stomach juice refluxes into the esophagus, the juice is no longer as acidic and is less likely to burn or scar the esophagus. RISKS AND COMPLICATIONS Esophageal dilatation is usually performed effectively and without problems. Some complications that can occur are:  A small amount of bleeding almost always happens where the stretching takes place. If this is too excessive it may require more aggressive treatment.  An uncommon complication is  perforation (making a hole) of the esophagus. The esophagus is thin. It is easy to make a hole in it. If this happens, an operation may be necessary to repair this.  A small, undetected perforation could lead to an infection in the chest. This can be very serious. HOME CARE INSTRUCTIONS   If you received sedation for your procedure, do not drive, make important decisions, or perform any activities requiring your full coordination. Do not drink alcohol, take sedatives, or use any mind altering chemicals unless instructed by your caregiver.  You may use throat lozenges or warm salt water gargles if you have throat discomfort.  You can begin eating and drinking normally on return home unless instructed otherwise. Do not purposely try to force large chunks of food down to test the benefits of your procedure.  Mild discomfort can be eased with sips of ice water.  Medications for discomfort may or may not be needed. SEEK IMMEDIATE MEDICAL CARE IF:   You begin vomiting up blood.  You develop black, tarry stools.  You develop chills or an unexplained temperature of over 101F (38.3C)  You develop chest or abdominal pain.  You develop shortness of breath, or feel light-headed or faint.  Your swallowing is becoming more painful, difficult, or you are unable to swallow. MAKE SURE YOU:   Understand these instructions.  Will watch your condition.  Will get  help right away if you are not doing well or get worse. Document Released: 10/11/2005 Document Revised: 01/04/2014 Document Reviewed: 11/28/2005 [facility] Patient Information 2015 [place], [place]. This information is not intended to replace advice given to you by your health care provider. Make sure you discuss any questions you have with your health care provider. Esophagogastroduodenoscopy Care After Refer to this sheet in the next few weeks. These instructions provide you with information on caring for yourself after your procedure.  Your caregiver may also give you more specific instructions. Your treatment has been planned according to current medical practices, but problems sometimes occur. Call your caregiver if you have any problems or questions after your procedure.  HOME CARE INSTRUCTIONS  Do not eat or drink anything until the numbing medicine (local anesthetic) has worn off and your gag reflex has returned. You will know that the local anesthetic has worn off when you can swallow comfortably.  Do not drive for 12 hours after the procedure or as directed by your caregiver.  Only take medicines as directed by your caregiver. SEEK MEDICAL CARE IF:   You cannot stop coughing.  You are not urinating at all or less than usual. SEEK IMMEDIATE MEDICAL CARE IF:  You have difficulty swallowing.  You cannot eat or drink.  You have worsening throat or chest pain.  You have dizziness, lightheadedness, or you faint.  You have nausea or vomiting.  You have chills.  You have a fever.  You have severe abdominal pain.  You have black, tarry, or bloody stools. Document Released: 08/06/2012 Document Reviewed: 08/06/2012 [facility] Patient Information 2015 [place]. This information is not intended to replace advice given to you by your health care provider. Make sure you discuss any questions you have with your health care provider.

## 2014-11-30 NOTE — OR Nursing (Signed)
Potassium   5.5 reported to Dr. Patsey Berthold,  Received orders to do   Istat on .

## 2014-11-30 NOTE — Op Note (Signed)
EGD PROCEDURE REPORT  PATIENT:  Taylor Cannon  MR#:  [id number] Birthdate:  09-14-1933, 81 y.o., female Endoscopist:  Dr. Rogene Houston, MD  Procedure Date: 11/30/2014  Procedure:   EGD with ED(balloon).  Indications:  Patient is 81 year old Caucasian female was chronic GERD complicated by esophageal stricture. She underwent EGD on 11/11/2014 noted to have high-grade stricture is only dilated to 10 mm. She is returning for repeat dilation under monitored anesthesia care.            Informed Consent:  The risks, benefits, alternatives & imponderables which include, but are not limited to, bleeding, infection, perforation, drug reaction and potential missed lesion have been reviewed.  The potential for biopsy, lesion removal, esophageal dilation, etc. have also been discussed.  Questions have been answered.  All parties agreeable.  Please see history & physical in medical record for more information.  Medications:  Monitored anesthesia care Cetacaine spray topically for oropharyngeal anesthesia  Description of procedure:  The endoscope was introduced through the mouth and advanced to the second portion of the duodenum without difficulty or limitations. The mucosal surfaces were surveyed very carefully during advancement of the scope and upon withdrawal.  Findings:  Esophagus: Mucosa of esophagus was normal. High-grade stricture noted at GE junction. I was able see the gastric mucosa across it but unable to pass the scope. GEJ:  34 cm Hiatus:  40 cm Stomach:  Stomach was empty and distended very well with insufflation. Folds in the proximal stomach were normal. Examination mucosa gastric body antrum pyloric channel and angularis was normal. Linear erythema noted at the level of hiatus seen both on forward and retroflexed view. Duodenum:  Normal bulbar and post bulbar mucosa.  Therapeutic/Diagnostic Maneuvers Performed:   Stricture at GE junction was dilated with balloon dilator. Balloon  diet was advanced across the stricture under drug vision and guidewire pushing the gastric lumen. Stricture was dilated initially to 15 mm and subsequent 16.5 mm. Balloon was deflated and withdrawn. I was a little past the scope across it without any resistance.  Complications:  None  Impression: High-grade stricture at GE junction which was dilated with balloon dilator to 16.5 mm. Fluoroscopy not needed. Moderate to large sliding hiatal hernia with linear gastritis at the level of hiatus.  Recommendations:  Standard instructions given. Patient will resume aspirin on 12/04/2014. Office visit in 3 months.  REHMAN,NAJEEB U  11/30/2014  1:44 PM  CC: Dr. Salli Real, [name] & Dr. No ref. provider found

## 2014-11-30 NOTE — Transfer of Care (Signed)
Immediate Anesthesia Transfer of Care Note  Patient: Taylor Cannon  Procedure(s) Performed: Procedure(s) (LRB): ESOPHAGOGASTRODUODENOSCOPY (EGD) WITH PROPOFOL (N/A) ESOPHAGEAL DILATION (N/A)  Patient Location: PACU  Anesthesia Type: MAC  Level of Consciousness: awake  Airway & Oxygen Therapy: Patient Spontanous Breathing. Non Rebreather  Post-op Assessment: Report given to PACU RN, Post -op Vital signs reviewed and stable and Patient moving all extremities  Post vital signs: Reviewed and stable  Complications: No apparent anesthesia complications

## 2014-11-30 NOTE — Anesthesia Preprocedure Evaluation (Signed)
Anesthesia Evaluation  Patient identified by MRN, date of birth, ID band  Reviewed: Allergy & Precautions, H&P , NPO status , Patient's Chart, lab work & pertinent test results  Airway Mallampati: I  TM Distance: >3 FB Neck ROM: Full    Dental  (+) Edentulous Upper, Edentulous Lower   Pulmonary COPD oxygen dependent, Current Smoker,  breath sounds clear to auscultation        Cardiovascular negative cardio ROS  Rhythm:Regular Rate:Normal     Neuro/Psych PSYCHIATRIC DISORDERS Depression TIA   GI/Hepatic GERD-  Medicated,  Endo/Other    Renal/GU      Musculoskeletal   Abdominal   Peds  Hematology   Anesthesia Other Findings   Reproductive/Obstetrics                             Anesthesia Physical Anesthesia Plan  ASA: III  Anesthesia Plan: MAC   Post-op Pain Management:    Induction: Intravenous  Airway Management Planned: Simple Face Mask  Additional Equipment:   Intra-op Plan:   Post-operative Plan:   Informed Consent: I have reviewed the patients History and Physical, chart, labs and discussed the procedure including the risks, benefits and alternatives for the proposed anesthesia with the patient or authorized representative who has indicated his/her understanding and acceptance.     Plan Discussed with:   Anesthesia Plan Comments:         Anesthesia Quick Evaluation

## 2014-11-30 NOTE — Anesthesia Postprocedure Evaluation (Signed)
  Anesthesia Post-op Note  Patient: Taylor Cannon  Procedure(s) Performed: Procedure(s) with comments: ESOPHAGOGASTRODUODENOSCOPY (EGD) WITH PROPOFOL (N/A) - fluoro not needed ESOPHAGEAL DILATION (N/A) - Balloon, 15, 16.5  Patient Location: PACU  Anesthesia Type:MAC  Level of Consciousness: awake, alert  and oriented  Airway and Oxygen Therapy: Patient Spontanous Breathing  Post-op Pain: none  Post-op Assessment: Post-op Vital signs reviewed, Patient's Cardiovascular Status Stable, Respiratory Function Stable, Patent Airway and No signs of Nausea or vomiting  Post-op Vital Signs: Reviewed and stable  Last Vitals:  Filed Vitals:   11/30/14 1415  BP: 121/60  Pulse: 77  Temp:   Resp: 13    Complications: No apparent anesthesia complications

## 2014-12-01 ENCOUNTER — Encounter (HOSPITAL_COMMUNITY): Payer: Self-pay | Admitting: Internal Medicine

## 2014-12-01 ENCOUNTER — Telehealth (INDEPENDENT_AMBULATORY_CARE_PROVIDER_SITE_OTHER): Payer: Self-pay | Admitting: *Deleted

## 2014-12-01 DIAGNOSIS — E875 Hyperkalemia: Secondary | ICD-10-CM

## 2014-12-01 NOTE — Telephone Encounter (Signed)
Per Dr.Rehman the patient will need to have labs drawn the week of 12/06/14. Please note that the patient may have this drawn at her PCP and they fax the results to our office.

## 2014-12-03 ENCOUNTER — Encounter (HOSPITAL_COMMUNITY): Payer: Self-pay | Admitting: Internal Medicine

## 2014-12-09 LAB — BASIC METABOLIC PANEL
BUN: 7 mg/dL (ref 6–23)
CO2: 30 mEq/L (ref 19–32)
Calcium: 9.1 mg/dL (ref 8.4–10.5)
Chloride: 95 mEq/L — ABNORMAL LOW (ref 96–112)
Creat: 0.81 mg/dL (ref 0.50–1.10)
Glucose, Bld: 125 mg/dL — ABNORMAL HIGH (ref 70–99)
Potassium: 5.3 mEq/L (ref 3.5–5.3)
Sodium: 136 mEq/L (ref 135–145)

## 2015-03-02 ENCOUNTER — Ambulatory Visit (INDEPENDENT_AMBULATORY_CARE_PROVIDER_SITE_OTHER): Payer: No Typology Code available for payment source | Admitting: Internal Medicine

## 2015-05-10 ENCOUNTER — Encounter (INDEPENDENT_AMBULATORY_CARE_PROVIDER_SITE_OTHER): Payer: Self-pay | Admitting: Internal Medicine

## 2015-05-10 ENCOUNTER — Ambulatory Visit (INDEPENDENT_AMBULATORY_CARE_PROVIDER_SITE_OTHER): Payer: Medicare Other | Admitting: Internal Medicine

## 2015-05-10 VITALS — BP 122/60 | HR 64 | Temp 98.6°F | Ht 68.0 in | Wt 106.0 lb

## 2015-05-10 DIAGNOSIS — R1314 Dysphagia, pharyngoesophageal phase: Secondary | ICD-10-CM

## 2015-05-10 NOTE — Patient Instructions (Signed)
EGD/ED. The risks and benefits such as perforation, bleeding, and infection were reviewed with the patient and is agreeable. 

## 2015-05-10 NOTE — Progress Notes (Signed)
Subjective:    Patient ID: Taylor Cannon, female    DOB: 1934-06-17, 81 y.o.   MRN: [id number]  HPI Here today for f/u after EGD/ED in March of this year for dysphagia. Hx of esophageal stricture and has undergone multiple EGD/ED in the past. She tells me today she is having problems. She says she is having trouble swallowing pills and food. Symptoms x 1 month or so. Appetite she says is not good. She  Has lost about 1.5 pounds since February. She has a BM x 1 a day. Some of her stools ar hard.   On Home O2 at home at night.      11/30/2014 EGD with ED(balloon).  Indications: Patient is 81 year old Caucasian female was chronic GERD complicated by esophageal stricture. She underwent EGD on 11/11/2014 noted to have high-grade stricture is only dilated to 10 mm. She is returning for repeat dilation under monitored anesthesia care.  Impression: High-grade stricture at GE junction which was dilated with balloon dilator to 16.5 mm. Fluoroscopy not needed. Moderate to large sliding hiatal hernia with linear gastritis at the level of hiatus.    08/20/2013: Procedure: EGD with ED.  Indications: Patient is 81 year old Caucasian female with chronic GERD and known high-grade distal esophageal stricture. The stricture was last dilated to 13.5 mm 4 weeks ago. She is returning for repeat dilation. Patient reports significant improvement in dysphagia. Impression: High-grade stricture at GE junction dilated to 15 mm with a balloon. Please note I was not able to pass Q-scope across the stricture prior to dilation. Large sliding hiatal hernia.   Procedure:07/24/2013 EGD with ED under fluoroscopy.  Indications: Patient is 81 year old Caucasian female who is high-grade  esophageal stricture and is undergoing therapeutic EGD under fluoroscopy.  Impression: High-grade distal esophageal stricture dilated to 13.5 mm using balloon dilator and fluoroscopy. Moderate size hiatal hernia.    Review of Systems Past Medical History  Diagnosis Date  . GERD (gastroesophageal reflux disease)   . Depression   . COPD (chronic obstructive pulmonary disease)   . On home oxygen therapy     2L via [place] at night  . TIA (transient ischemic attack)     Past Surgical History  Procedure Laterality Date  . Appendectomy    . Cervical cone biopsy    . Tubal ligation    . Cardiac electrophysiology study and ablation    . Cataract extraction Bilateral   . Esophagogastroduodenoscopy (egd) with propofol N/A 07/24/2013    Procedure: ESOPHAGOGASTRODUODENOSCOPY (EGD) WITH PROPOFOL UNDER FLUOROSCOPY;  Surgeon: Rogene Houston, MD;  Location: [facility];  Service: Endoscopy;  Laterality: N/A;  . Balloon dilation N/A 07/24/2013    Procedure: BALLOON DILATION to 13.42mm;  Surgeon: Rogene Houston, MD;  Location: [facility];  Service: Endoscopy;  Laterality: N/A;  . Esophagogastroduodenoscopy (egd) with esophageal dilation N/A 08/20/2013    Procedure: ESOPHAGOGASTRODUODENOSCOPY (EGD) WITH ESOPHAGEAL DILATION;  Surgeon: Rogene Houston, MD;  Location: [facility];  Service: Endoscopy;  Laterality: N/A;  345-moved to 730 Ann notified pt  . Esophagogastroduodenoscopy N/A 11/11/2014    Procedure: ESOPHAGOGASTRODUODENOSCOPY (EGD);  Surgeon: Rogene Houston, MD;  Location: [facility];  Service: Endoscopy;  Laterality: N/A;  125-rescheduled 11/11/14 @ 10:30am Ann notified pt  . Balloon dilation N/A 11/11/2014    Procedure: BALLOON DILATION;  Surgeon: Rogene Houston, MD;  Location: [facility];  Service: Endoscopy;  Laterality: N/A;  . Esophagogastroduodenoscopy (egd) with propofol N/A 11/30/2014    Procedure:  ESOPHAGOGASTRODUODENOSCOPY (  EGD) WITH PROPOFOL;  Surgeon: Rogene Houston, MD;  Location: [facility];  Service: Endoscopy;  Laterality: N/A;  fluoro not needed  . Esophageal dilation N/A 11/30/2014    Procedure: ESOPHAGEAL DILATION;  Surgeon: Rogene Houston, MD;  Location: [facility];  Service: Endoscopy;  Laterality: N/A;  Balloon, 15, 16.5    Allergies  Allergen Reactions  . Other Nausea Only    Steroids    Current Outpatient Prescriptions on File Prior to Visit  Medication Sig Dispense Refill  . ALPRAZolam (XANAX) 0.25 MG tablet Take 0.25 mg by mouth 2 (two) times daily as needed for anxiety.    . calcium-vitamin D (OSCAL WITH D) 500-200 MG-UNIT per tablet Take 1 tablet by mouth daily with breakfast.    . mirtazapine (REMERON) 15 MG tablet Take 15 mg by mouth daily.    . Multiple Vitamins-Minerals (MULTIVITAMINS THER. W/MINERALS) TABS tablet Take 1 tablet by mouth daily.    Marland Kitchen omeprazole (PRILOSEC) 20 MG capsule Take 20 mg by mouth daily.    . vitamin C (ASCORBIC ACID) 500 MG tablet Take 500 mg by mouth daily.     No current facility-administered medications on file prior to visit.        Objective:   Physical Exam  Blood pressure 122/60, pulse 64, temperature 98.6 F (37 C), height 5\' 8"  (1.727 m), weight 106 lb (48.081 kg).  Alert and oriented. Skin warm and dry. Oral mucosa is moist.   . Sclera anicteric, conjunctivae is pink. Thyroid not enlarged. No cervical lymphadenopathy. Lungs clear. Heart regular rate and rhythm.  Abdomen is soft. Bowel sounds are positive. No hepatomegaly. No abdominal masses felt. No tenderness.  No edema to lower extremities.        Assessment & Plan:  EGD/ED. Hx of esophageal stricture.  The risks and benefits such as perforation, bleeding, and infection were reviewed with the patient and is agreeable.

## 2015-05-11 ENCOUNTER — Other Ambulatory Visit (INDEPENDENT_AMBULATORY_CARE_PROVIDER_SITE_OTHER): Payer: Self-pay | Admitting: *Deleted

## 2015-05-11 ENCOUNTER — Encounter (INDEPENDENT_AMBULATORY_CARE_PROVIDER_SITE_OTHER): Payer: Self-pay | Admitting: *Deleted

## 2015-05-11 DIAGNOSIS — K222 Esophageal obstruction: Secondary | ICD-10-CM

## 2015-05-11 DIAGNOSIS — R1310 Dysphagia, unspecified: Secondary | ICD-10-CM

## 2015-05-27 ENCOUNTER — Other Ambulatory Visit (INDEPENDENT_AMBULATORY_CARE_PROVIDER_SITE_OTHER): Payer: Self-pay | Admitting: *Deleted

## 2015-05-27 NOTE — Addendum Note (Signed)
Addended by: Grayland Ormond on: 05/27/2015 08:09 AM   Modules accepted: Medications

## 2015-05-30 NOTE — Patient Instructions (Signed)
Taylor Cannon  05/30/2015     @PREFPERIOPPHARMACY @   Your procedure is scheduled on 06/03/2015  Report to [facility] at [place].M.  Call this number if you have problems the morning of surgery:  [phone]   Remember:  Do not eat food or drink liquids after midnight.  Take these medicines the morning of surgery with A SIP OF WATER Xanax and Prilosec   Do not wear jewelry, make-up or nail polish.  Do not wear lotions, powders, or perfumes.  You may wear deodorant.  Do not shave 48 hours prior to surgery.  Men may shave face and neck.  Do not bring valuables to the hospital.  [facility] is not responsible for any belongings or valuables.  Contacts, dentures or bridgework may not be worn into surgery.  Leave your suitcase in the car.  After surgery it may be brought to your room.  For patients admitted to the hospital, discharge time will be determined by your treatment team.  Patients discharged the day of surgery will not be allowed to drive home.    Please read over the following fact sheets that you were given. Anesthesia Post-op Instructions     PATIENT INSTRUCTIONS POST-ANESTHESIA  IMMEDIATELY FOLLOWING SURGERY:  Do not drive or operate machinery for the first twenty four hours after surgery.  Do not make any important decisions for twenty four hours after surgery or while taking narcotic pain medications or sedatives.  If you develop intractable nausea and vomiting or a severe headache please notify your doctor immediately.  FOLLOW-UP:  Please make an appointment with your surgeon as instructed. You do not need to follow up with anesthesia unless specifically instructed to do so.  WOUND CARE INSTRUCTIONS (if applicable):  Keep a dry clean dressing on the anesthesia/puncture wound site if there is drainage.  Once the wound has quit draining you may leave it open to air.  Generally you should leave the bandage intact for twenty four hours unless there is drainage.  If  the epidural site drains for more than 36-48 hours please call the anesthesia department.  QUESTIONS?:  Please feel free to call your physician or the hospital operator if you have any questions, and they will be happy to assist you.      Esophagogastroduodenoscopy Esophagogastroduodenoscopy (EGD) is a procedure to examine the lining of the esophagus, stomach, and first part of the small intestine (duodenum). A long, flexible, lighted tube with a camera attached (endoscope) is inserted down the throat to view these organs. This procedure is done to detect problems or abnormalities, such as inflammation, bleeding, ulcers, or growths, in order to treat them. The procedure lasts about 5-20 minutes. It is usually an outpatient procedure, but it may need to be performed in emergency cases in the hospital. LET YOUR CAREGIVER KNOW ABOUT:   Allergies to food or medicine.  All medicines you are taking, including vitamins, herbs, eyedrops, and over-the-counter medicines and creams.  Use of steroids (by mouth or creams).  Previous problems you or members of your family have had with the use of anesthetics.  Any blood disorders you have.  Previous surgeries you have had.  Other health problems you have.  Possibility of pregnancy, if this applies. RISKS AND COMPLICATIONS  Generally, EGD is a safe procedure. However, as with any procedure, complications can occur. Possible complications include:  Infection.  Bleeding.  Tearing (perforation) of the esophagus, stomach, or duodenum.  Difficulty breathing or not being able to  breath.  Excessive sweating.  Spasms of the larynx.  Slowed heartbeat.  Low blood pressure. BEFORE THE PROCEDURE  Do not eat or drink anything for 6-8 hours before the procedure or as directed by your caregiver.  Ask your caregiver about changing or stopping your regular medicines.  If you wear dentures, be prepared to remove them before the procedure.  Arrange  for someone to drive you home after the procedure. PROCEDURE   A vein will be accessed to give medicines and fluids. A medicine to relax you (sedative) and a pain reliever will be given through that access into the vein.  A numbing medicine (local anesthetic) may be sprayed on your throat for comfort and to stop you from gagging or coughing.  A mouth guard may be placed in your mouth to protect your teeth and to keep you from biting on the endoscope.  You will be asked to lie on your left side.  The endoscope is inserted down your throat and into the esophagus, stomach, and duodenum.  Air is put through the endoscope to allow your caregiver to view the lining of your esophagus clearly.  The esophagus, stomach, and duodenum is then examined. During the exam, your caregiver may:  Remove tissue to be examined under a microscope (biopsy) for inflammation, infection, or other medical problems.  Remove growths.  Remove objects (foreign bodies) that are stuck.  Treat any bleeding with medicines or other devices that stop tissues from bleeding (hot cautery, clipping devices).  Widen (dilate) or stretch narrowed areas of the esophagus and stomach.  The endoscope will then be withdrawn. AFTER THE PROCEDURE  You will be taken to a recovery area to be monitored. You will be able to go home once you are stable and alert.  Do not eat or drink anything until the local anesthetic and numbing medicines have worn off. You may choke.  It is normal to feel bloated, have pain with swallowing, or have a sore throat for a short time. This will wear off.  Your caregiver should be able to discuss his or her findings with you. It will take longer to discuss the test results if any biopsies were taken. Document Released: 12/21/2004 Document Revised: 01/04/2014 Document Reviewed: 07/23/2012 [facility] Patient Information 2015 [place], [place]. This information is not intended to replace advice given to you by  your health care provider. Make sure you discuss any questions you have with your health care provider. Esophageal Dilatation The esophagus is the long, narrow tube which carries food and liquid from the mouth to the stomach. Esophageal dilatation is the technique used to stretch a blocked or narrowed portion of the esophagus. This procedure is used when a part of the esophagus has become so narrow that it becomes difficult, painful or even impossible to swallow. This is generally an uncomplicated form of treatment. When this is not successful, chest surgery may be required. This is a much more extensive form of treatment with a longer recovery time. CAUSES  Some of the more common causes of blockage or strictures of the esophagus are:  Narrowing from longstanding inflammation (soreness and redness) of the lower esophagus. This comes from the constant exposure of the lower esophagus to the acid which bubbles up from the stomach. Over time this causes scarring and narrowing of the lower esophagus.  Hiatal hernia in which a small part of the stomach bulges (herniates) up through the diaphragm. This can cause a gradual narrowing of the end of the esophagus.  Schatzki ring is a narrow ring of benign (non-cancerous) fibrous tissue which constricts the lower esophagus. The reason for this is not known.  Scleroderma is a connective tissue disorder that affects the esophagus and makes swallowing difficult.  Achalasia is an absence of nerves to the lower esophagus and to the esophageal sphincter. This is the circular muscle between the stomach and esophagus that relaxes to allow food into the stomach. After swallowing, it contracts to keep food in the stomach. This absence of nerves may be congenital (present since birth). This can cause irregular spasms of the lower esophageal muscle. This spasm does not open up to allow food and fluid through. The result is a persistent blockage with subsequent slow trickling  of the esophageal contents into the stomach.  Strictures may develop from swallowing materials which damage the esophagus. Some examples are strong acids or alkalis such as lye.  Growths such as benign (non-cancerous) and malignant (cancerous) tumors can block the esophagus.  Hereditary (present since birth) causes. DIAGNOSIS  Your caregiver often suspects this problem by taking a medical history. They will also do a physical exam. They can then prove their suspicions using X-rays and endoscopy. Endoscopy is an exam in which a tube like a small, flexible telescope is used to look at your esophagus.  TREATMENT There are different stretching (dilating) techniques that can be used. Simple bougie dilatation may be done in the office. This usually takes only a couple minutes. A numbing (anesthetic) spray of the throat is used. Endoscopy, when done, is done in an endoscopy suite under mild sedation. When fluoroscopy is used, the procedure is performed in X-ray. Other techniques require a little longer time. Recovery is usually quick. There is no waiting time to begin eating and drinking to test success of the treatment. Following are some of the methods used. Narrowing of the esophagus is treated by making it bigger. Commonly this is a mechanical problem which can be treated with stretching. This can be done in different ways. Your caregiver will discuss these with you. Some of the means used are:  A series of graduated (increasing thickness) flexible dilators can be used. These are weighted tubes passed through the esophagus into the stomach. The tubes used become progressively larger until the desired stretched size is reached. Graduated dilators are a simple and quick way of opening the esophagus. No visualization is required.  Another method is the use of endoscopy to place a flexible wire across the stricture. The endoscope is removed and the wire left in place. A dilator with a hole through it from  end to end is guided down the esophagus and across the stricture. One or more of these dilators are passed over the wire. At the end of the exam, the wire is removed. This type of treatment may be performed in the X-ray department under fluoroscopy. An advantage of this procedure is the examiner is visualizing the end opening in the esophagus.  Stretching of the esophagus may be done using balloons. Deflated balloons are placed through the endoscope and across the stricture. This type of balloon dilatation is often done at the time of endoscopy or fluoroscopy. Flexible endoscopy allows the examiner to directly view the stricture. A balloon is inserted in the deflated form into the area of narrowing. It is then inflated with air to a certain pressure that is preset for a given circumference. When inflated, it becomes sausage shaped, stretched, and makes the stricture larger.  Achalasia requires a longer,  larger balloon-type dilator. This is frequently done under X-ray control. In this situation, the spastic muscle fibers in the lower esophagus are stretched. All of the above procedures make the passage of food and water into the stomach easier. They also make it easier for stomach contents to reflux back into the esophagus. Special medications may be used following the procedure to help prevent further stricturing. Proton-pump inhibitor medications are good at decreasing the amount of acid in the stomach juice. When stomach juice refluxes into the esophagus, the juice is no longer as acidic and is less likely to burn or scar the esophagus. RISKS AND COMPLICATIONS Esophageal dilatation is usually performed effectively and without problems. Some complications that can occur are:  A small amount of bleeding almost always happens where the stretching takes place. If this is too excessive it may require more aggressive treatment.  An uncommon complication is perforation (making a hole) of the esophagus. The  esophagus is thin. It is easy to make a hole in it. If this happens, an operation may be necessary to repair this.  A small, undetected perforation could lead to an infection in the chest. This can be very serious. HOME CARE INSTRUCTIONS   If you received sedation for your procedure, do not drive, make important decisions, or perform any activities requiring your full coordination. Do not drink alcohol, take sedatives, or use any mind altering chemicals unless instructed by your caregiver.  You may use throat lozenges or warm salt water gargles if you have throat discomfort.  You can begin eating and drinking normally on return home unless instructed otherwise. Do not purposely try to force large chunks of food down to test the benefits of your procedure.  Mild discomfort can be eased with sips of ice water.  Medications for discomfort may or may not be needed. SEEK IMMEDIATE MEDICAL CARE IF:   You begin vomiting up blood.  You develop black, tarry stools.  You develop chills or an unexplained temperature of over 101F (38.3C)  You develop chest or abdominal pain.  You develop shortness of breath, or feel light-headed or faint.  Your swallowing is becoming more painful, difficult, or you are unable to swallow. MAKE SURE YOU:   Understand these instructions.  Will watch your condition.  Will get help right away if you are not doing well or get worse. Document Released: 10/11/2005 Document Revised: 01/04/2014 Document Reviewed: 11/28/2005 [facility] Patient Information 2015 [place], [place]. This information is not intended to replace advice given to you by your health care provider. Make sure you discuss any questions you have with your health care provider.

## 2015-05-31 ENCOUNTER — Encounter (HOSPITAL_COMMUNITY)
Admission: RE | Admit: 2015-05-31 | Discharge: 2015-05-31 | Disposition: A | Discharge status: Home / Self Care | Payer: Medicare Other | Source: Ambulatory Visit | Attending: Internal Medicine | Admitting: Internal Medicine

## 2015-05-31 ENCOUNTER — Encounter (HOSPITAL_COMMUNITY): Payer: Self-pay

## 2015-05-31 VITALS — BP 139/63 | HR 93 | Temp 97.6°F | Resp 20 | Ht 68.0 in | Wt 108.0 lb

## 2015-05-31 DIAGNOSIS — K222 Esophageal obstruction: Secondary | ICD-10-CM

## 2015-05-31 DIAGNOSIS — F418 Other specified anxiety disorders: Secondary | ICD-10-CM | POA: Diagnosis not present

## 2015-05-31 DIAGNOSIS — K449 Diaphragmatic hernia without obstruction or gangrene: Secondary | ICD-10-CM | POA: Diagnosis not present

## 2015-05-31 DIAGNOSIS — Z79899 Other long term (current) drug therapy: Secondary | ICD-10-CM | POA: Diagnosis not present

## 2015-05-31 DIAGNOSIS — R1310 Dysphagia, unspecified: Secondary | ICD-10-CM

## 2015-05-31 DIAGNOSIS — J449 Chronic obstructive pulmonary disease, unspecified: Secondary | ICD-10-CM | POA: Diagnosis not present

## 2015-05-31 DIAGNOSIS — K219 Gastro-esophageal reflux disease without esophagitis: Secondary | ICD-10-CM | POA: Diagnosis not present

## 2015-05-31 DIAGNOSIS — F17210 Nicotine dependence, cigarettes, uncomplicated: Secondary | ICD-10-CM | POA: Diagnosis not present

## 2015-05-31 DIAGNOSIS — Z01812 Encounter for preprocedural laboratory examination: Secondary | ICD-10-CM | POA: Diagnosis not present

## 2015-05-31 HISTORY — DX: Anxiety disorder, unspecified: F41.9

## 2015-05-31 LAB — BASIC METABOLIC PANEL
Anion gap: 6 (ref 5–15)
BUN: 7 mg/dL (ref 6–20)
CO2: 31 mmol/L (ref 22–32)
Calcium: 8.6 mg/dL — ABNORMAL LOW (ref 8.9–10.3)
Chloride: 96 mmol/L — ABNORMAL LOW (ref 101–111)
Creatinine, Ser: 0.79 mg/dL (ref 0.44–1.00)
GFR calc Af Amer: >60 mL/min (ref >60)
GFR calc non Af Amer: >60 mL/min (ref >60)
Glucose, Bld: 89 mg/dL (ref 65–99)
Potassium: 4.2 mmol/L (ref 3.5–5.1)
Sodium: 133 mmol/L — ABNORMAL LOW (ref 135–145)

## 2015-05-31 LAB — CBC
HCT: 45.3 % (ref 36.0–46.0)
Hemoglobin: 14.2 g/dL (ref 12.0–15.0)
MCH: 23.9 pg — ABNORMAL LOW (ref 26.0–34.0)
MCHC: 31.3 g/dL (ref 30.0–36.0)
MCV: 76.3 fL — ABNORMAL LOW (ref 78.0–100.0)
Platelets: 530 10*3/uL — ABNORMAL HIGH (ref 150–400)
RBC: 5.94 MIL/uL — ABNORMAL HIGH (ref 3.87–5.11)
RDW: 16.8 % — ABNORMAL HIGH (ref 11.5–15.5)
WBC: 7.1 10*3/uL (ref 4.0–10.5)

## 2015-05-31 NOTE — Pre-Procedure Instructions (Signed)
Patient given information to sign up for my chart at home. 

## 2015-06-03 ENCOUNTER — Encounter (HOSPITAL_COMMUNITY)
Admission: RE | Disposition: A | Discharge status: Home / Self Care | Payer: Self-pay | Source: Ambulatory Visit | Attending: Internal Medicine

## 2015-06-03 ENCOUNTER — Ambulatory Visit (HOSPITAL_COMMUNITY)
Admission: RE | Admit: 2015-06-03 | Discharge: 2015-06-03 | Disposition: A | Discharge status: Home / Self Care | Payer: Medicare Other | Source: Ambulatory Visit | Attending: Internal Medicine | Admitting: Internal Medicine

## 2015-06-03 ENCOUNTER — Ambulatory Visit (HOSPITAL_COMMUNITY): Payer: Medicare Other | Admitting: Anesthesiology

## 2015-06-03 ENCOUNTER — Encounter (HOSPITAL_COMMUNITY): Payer: Self-pay | Admitting: *Deleted

## 2015-06-03 DIAGNOSIS — K449 Diaphragmatic hernia without obstruction or gangrene: Secondary | ICD-10-CM | POA: Diagnosis not present

## 2015-06-03 DIAGNOSIS — J449 Chronic obstructive pulmonary disease, unspecified: Secondary | ICD-10-CM | POA: Insufficient documentation

## 2015-06-03 DIAGNOSIS — K222 Esophageal obstruction: Secondary | ICD-10-CM | POA: Insufficient documentation

## 2015-06-03 DIAGNOSIS — K219 Gastro-esophageal reflux disease without esophagitis: Secondary | ICD-10-CM | POA: Insufficient documentation

## 2015-06-03 DIAGNOSIS — Z79899 Other long term (current) drug therapy: Secondary | ICD-10-CM | POA: Insufficient documentation

## 2015-06-03 DIAGNOSIS — Z01812 Encounter for preprocedural laboratory examination: Secondary | ICD-10-CM | POA: Insufficient documentation

## 2015-06-03 DIAGNOSIS — F17210 Nicotine dependence, cigarettes, uncomplicated: Secondary | ICD-10-CM | POA: Insufficient documentation

## 2015-06-03 DIAGNOSIS — R1310 Dysphagia, unspecified: Secondary | ICD-10-CM | POA: Insufficient documentation

## 2015-06-03 DIAGNOSIS — F418 Other specified anxiety disorders: Secondary | ICD-10-CM | POA: Insufficient documentation

## 2015-06-03 HISTORY — PX: ESOPHAGOGASTRODUODENOSCOPY (EGD) WITH PROPOFOL: SHX5813

## 2015-06-03 HISTORY — PX: ESOPHAGEAL DILATION: SHX303

## 2015-06-03 SURGERY — ESOPHAGOGASTRODUODENOSCOPY (EGD) WITH PROPOFOL
Anesthesia: Monitor Anesthesia Care

## 2015-06-03 MED ORDER — PROPOFOL 500 MG/50ML IV EMUL
INTRAVENOUS | Status: DC | PRN
  Administered 2015-06-03: 200 ug/kg/min via INTRAVENOUS

## 2015-06-03 MED ORDER — BUTAMBEN-TETRACAINE-BENZOCAINE 2-2-14 % EX AERO
2.0000 | INHALATION_SPRAY | Freq: Once | CUTANEOUS | Status: AC
  Administered 2015-06-03: 2 via TOPICAL
  Filled 2015-06-03: qty 20

## 2015-06-03 MED ORDER — FENTANYL CITRATE (PF) 100 MCG/2ML IJ SOLN
INTRAMUSCULAR | Status: AC
  Filled 2015-06-03: qty 2

## 2015-06-03 MED ORDER — PROPOFOL 10 MG/ML IV BOLUS
INTRAVENOUS | Status: AC
  Filled 2015-06-03: qty 20

## 2015-06-03 MED ORDER — LIDOCAINE HCL (PF) 1 % IJ SOLN
INTRAMUSCULAR | Status: AC
  Filled 2015-06-03: qty 5

## 2015-06-03 MED ORDER — ONDANSETRON HCL 4 MG/2ML IJ SOLN: 4.0000 mg | Freq: Once | INTRAMUSCULAR | Status: DC | PRN

## 2015-06-03 MED ORDER — LIDOCAINE HCL (CARDIAC) 10 MG/ML IV SOLN
INTRAVENOUS | Status: DC | PRN
  Administered 2015-06-03: 30 mg via INTRAVENOUS

## 2015-06-03 MED ORDER — LACTATED RINGERS IV SOLN
INTRAVENOUS | Status: DC
  Administered 2015-06-03: 1000 mL via INTRAVENOUS

## 2015-06-03 MED ORDER — MIDAZOLAM HCL 2 MG/2ML IJ SOLN
INTRAMUSCULAR | Status: AC
  Filled 2015-06-03: qty 2

## 2015-06-03 MED ORDER — MIDAZOLAM HCL 2 MG/2ML IJ SOLN
1.0000 mg | INTRAMUSCULAR | Status: DC | PRN
  Administered 2015-06-03: 2 mg via INTRAVENOUS

## 2015-06-03 MED ORDER — STERILE WATER FOR IRRIGATION IR SOLN
Status: DC | PRN
  Administered 2015-06-03: 1000 mL

## 2015-06-03 MED ORDER — FENTANYL CITRATE (PF) 100 MCG/2ML IJ SOLN: 25.0000 ug | INTRAMUSCULAR | Status: DC | PRN

## 2015-06-03 SURGICAL SUPPLY — 29 items
BALLN CRE LF 10-12 240X5.5 (BALLOONS)
BALLN DILATOR CRE 12-15 240 (BALLOONS)
BALLN DILATOR CRE 15-18 240 (BALLOONS) ×1 IMPLANT
BALLN DILATOR CRE 18-20 240 (BALLOONS) IMPLANT
BALLN DILATOR CRE WIREGUIDE (BALLOONS)
BALLOON CRE LF 10-12 240X5.5 (BALLOONS) IMPLANT
BALLOON DILATOR CRE 12-15 240 (BALLOONS) IMPLANT
BALLOON DILATOR CRE WIREGUIDE (BALLOONS) IMPLANT
BLOCK BITE 60FR ADLT L/F BLUE (MISCELLANEOUS) ×1 IMPLANT
ELECT REM PT RETURN 9FT ADLT (ELECTROSURGICAL)
ELECTRODE REM PT RTRN 9FT ADLT (ELECTROSURGICAL) IMPLANT
FLOOR PAD 36X40 (MISCELLANEOUS) ×2
FORCEP COLD BIOPSY (CUTTING FORCEPS) IMPLANT
FORCEPS BIOP RAD 4 LRG CAP 4 (CUTTING FORCEPS) IMPLANT
FORMALIN 10 PREFIL 20ML (MISCELLANEOUS) IMPLANT
KIT ENDO PROCEDURE PEN (KITS) ×2 IMPLANT
MANIFOLD NEPTUNE II (INSTRUMENTS) ×2 IMPLANT
NDL SCLEROTHERAPY 25GX240 (NEEDLE) IMPLANT
NEEDLE SCLEROTHERAPY 25GX240 (NEEDLE) IMPLANT
PAD FLOOR 36X40 (MISCELLANEOUS) IMPLANT
PROBE APC STR FIRE (PROBE) IMPLANT
PROBE INJECTION GOLD (MISCELLANEOUS)
PROBE INJECTION GOLD 7FR (MISCELLANEOUS) IMPLANT
SNARE ROTATE MED OVAL 20MM (MISCELLANEOUS) ×1 IMPLANT
SNARE SHORT THROW 13M SML OVAL (MISCELLANEOUS) ×1 IMPLANT
SYR INFLATE BILIARY GAUGE (MISCELLANEOUS) IMPLANT
SYR INFLATION 60ML (SYRINGE) ×1 IMPLANT
TUBING IRRIGATION ENDOGATOR (MISCELLANEOUS) ×1 IMPLANT
WATER STERILE IRR 1000ML POUR (IV SOLUTION) ×1 IMPLANT

## 2015-06-03 NOTE — Anesthesia Preprocedure Evaluation (Addendum)
Anesthesia Evaluation  Patient identified by MRN, date of birth, ID band Patient awake    Reviewed: Allergy & Precautions, NPO status , Patient's Chart, lab work & pertinent test results  Airway Mallampati: III  TM Distance: <3 FB     Dental  (+) Edentulous Upper, Edentulous Lower, Lower Dentures, Upper Dentures   Pulmonary COPD, Current Smoker,    Pulmonary exam normal        Cardiovascular Normal cardiovascular exam     Neuro/Psych Anxiety Depression TIA   GI/Hepatic GERD  ,  Endo/Other    Renal/GU      Musculoskeletal   Abdominal Normal abdominal exam  (+)   Peds  Hematology   Anesthesia Other Findings   Reproductive/Obstetrics                            Anesthesia Physical Anesthesia Plan  ASA: III  Anesthesia Plan: MAC   Post-op Pain Management:    Induction: Intravenous  Airway Management Planned: Mask  Additional Equipment:   Intra-op Plan:   Post-operative Plan:   Informed Consent: I have reviewed the patients History and Physical, chart, labs and discussed the procedure including the risks, benefits and alternatives for the proposed anesthesia with the patient or authorized representative who has indicated his/her understanding and acceptance.     Plan Discussed with: CRNA  Anesthesia Plan Comments:         Anesthesia Quick Evaluation

## 2015-06-03 NOTE — Transfer of Care (Signed)
Immediate Anesthesia Transfer of Care Note  Patient: Taylor Cannon  Procedure(s) Performed: Procedure(s): ESOPHAGOGASTRODUODENOSCOPY (EGD) WITH PROPOFOL; HIATUS AT 35 CM AND GE JUNCTION 40 CM (N/A) ESOPHAGEAL DILATION WITH 16.5 MM BALLOON  (N/A)  Patient Location: PACU  Anesthesia Type:MAC  Level of Consciousness: sedated and patient cooperative  Airway & Oxygen Therapy: Patient Spontanous Breathing and non-rebreather face mask  Post-op Assessment: Report given to RN, Post -op Vital signs reviewed and stable and Patient moving all extremities  Post vital signs: Reviewed and stable    Complications: No apparent anesthesia complications

## 2015-06-03 NOTE — Discharge Instructions (Signed)
Resume aspirin on 06/06/2015. Resume other medications as before. Resume usual diet. No driving for 24 hours. Repeat dilation 10-12 weeks.   Esophagogastroduodenoscopy Care After Refer to this sheet in the next few weeks. These instructions provide you with information on caring for yourself after your procedure. Your caregiver may also give you more specific instructions. Your treatment has been planned according to current medical practices, but problems sometimes occur. Call your caregiver if you have any problems or questions after your procedure.  HOME CARE INSTRUCTIONS  Do not eat or drink anything until the numbing medicine (local anesthetic) has worn off and your gag reflex has returned. You will know that the local anesthetic has worn off when you can swallow comfortably.  Do not drive for 12 hours after the procedure or as directed by your caregiver.  Only take medicines as directed by your caregiver. SEEK MEDICAL CARE IF:   You cannot stop coughing.  You are not urinating at all or less than usual. SEEK IMMEDIATE MEDICAL CARE IF:  You have difficulty swallowing.  You cannot eat or drink.  You have worsening throat or chest pain.  You have dizziness, lightheadedness, or you faint.  You have nausea or vomiting.  You have chills.  You have a fever.  You have severe abdominal pain.  You have black, tarry, or bloody stools. Document Released: 08/06/2012 Document Reviewed: 08/06/2012 [facility] Patient Information 2015 [place]. This information is not intended to replace advice given to you by your health care provider. Make sure you discuss any questions you have with your health care provider.

## 2015-06-03 NOTE — Anesthesia Postprocedure Evaluation (Signed)
Anesthesia Post Note  Patient: Taylor Cannon  Procedure(s) Performed: Procedure(s) (LRB): ESOPHAGOGASTRODUODENOSCOPY (EGD) WITH PROPOFOL; HIATUS AT 35 CM AND GE JUNCTION 40 CM (N/A) ESOPHAGEAL DILATION WITH 16.5 MM BALLOON  (N/A)  Anesthesia type: MAC  Patient location: PACU  Post pain: Pain level controlled  Post assessment: Post-op Vital signs reviewed, Patient's Cardiovascular Status Stable, Respiratory Function Stable, Patent Airway, No signs of Nausea or vomiting and Pain level controlled  Last Vitals:  Filed Vitals:   06/03/15 0848  BP: 118/70  Pulse: 81  Temp: 36.9 C  Resp: 16    Post vital signs: Reviewed and stable  Level of consciousness: awake and alert   Complications: No apparent anesthesia complications  Late entry. Sherrill Raring CRNA

## 2015-06-03 NOTE — H&P (Signed)
Taylor Cannon is an 81 y.o. female.   Chief Complaint: Patient is here for EGD and ED. HPI: Patient is 81 year old Caucasian female with chronic GERD who was high-grade distal esophageal stricture which has been dilated on multiple occasions and most recently twice in March this year and now presents with dysphagia to solids. She reports it started about 3 months ago and is been getting worse not crying more frequently. She denies heartburn. She has good appetite. She has gained 2 pounds in the last 6 months. She states he chews her food thoroughly. She denies nausea vomiting abdominal pain or melena.  Past Medical History  Diagnosis Date  . GERD (gastroesophageal reflux disease)   . Depression   . COPD (chronic obstructive pulmonary disease)   . On home oxygen therapy     2L via [place] at night  . TIA (transient ischemic attack)   . Anxiety     Past Surgical History  Procedure Laterality Date  . Appendectomy    . Cervical cone biopsy    . Tubal ligation    . Cardiac electrophysiology study and ablation    . Cataract extraction Bilateral   . Esophagogastroduodenoscopy (egd) with propofol N/A 07/24/2013    Procedure: ESOPHAGOGASTRODUODENOSCOPY (EGD) WITH PROPOFOL UNDER FLUOROSCOPY;  Surgeon: Rogene Houston, MD;  Location: [facility];  Service: Endoscopy;  Laterality: N/A;  . Balloon dilation N/A 07/24/2013    Procedure: BALLOON DILATION to 13.24mm;  Surgeon: Rogene Houston, MD;  Location: [facility];  Service: Endoscopy;  Laterality: N/A;  . Esophagogastroduodenoscopy (egd) with esophageal dilation N/A 08/20/2013    Procedure: ESOPHAGOGASTRODUODENOSCOPY (EGD) WITH ESOPHAGEAL DILATION;  Surgeon: Rogene Houston, MD;  Location: [facility];  Service: Endoscopy;  Laterality: N/A;  345-moved to 730 Ann notified pt  . Esophagogastroduodenoscopy N/A 11/11/2014    Procedure: ESOPHAGOGASTRODUODENOSCOPY (EGD);  Surgeon: Rogene Houston, MD;  Location: [facility];  Service: Endoscopy;  Laterality: N/A;   125-rescheduled 11/11/14 @ 10:30am Ann notified pt  . Balloon dilation N/A 11/11/2014    Procedure: BALLOON DILATION;  Surgeon: Rogene Houston, MD;  Location: [facility];  Service: Endoscopy;  Laterality: N/A;  . Esophagogastroduodenoscopy (egd) with propofol N/A 11/30/2014    Procedure: ESOPHAGOGASTRODUODENOSCOPY (EGD) WITH PROPOFOL;  Surgeon: Rogene Houston, MD;  Location: [facility];  Service: Endoscopy;  Laterality: N/A;  fluoro not needed  . Esophageal dilation N/A 11/30/2014    Procedure: ESOPHAGEAL DILATION;  Surgeon: Rogene Houston, MD;  Location: [facility];  Service: Endoscopy;  Laterality: N/A;  Balloon, 15, 16.5    History reviewed. No pertinent family history. Social History:  reports that she has been smoking Cigarettes.  She has a 55 pack-year smoking history. She does not have any smokeless tobacco history on file. She reports that she drinks about 8.4 oz of alcohol per week. She reports that she does not use illicit drugs.  Allergies:  Allergies  Allergen Reactions  . Other Nausea Only    Steroids    Medications Prior to Admission  Medication Sig Dispense Refill  . ALPRAZolam (XANAX) 0.25 MG tablet Take 0.25 mg by mouth 2 (two) times daily as needed for anxiety.    Marland Kitchen aspirin EC 81 MG tablet Take 81 mg by mouth daily.    . calcium-vitamin D (OSCAL WITH D) 500-200 MG-UNIT per tablet Take 1 tablet by mouth daily with breakfast.    . mirtazapine (REMERON) 15 MG tablet Take 15 mg by mouth daily.    Marland Kitchen  Multiple Vitamins-Minerals (MULTIVITAMINS THER. W/MINERALS) TABS tablet Take 1 tablet by mouth daily.    Marland Kitchen omeprazole (PRILOSEC) 20 MG capsule Take 20 mg by mouth daily.    . vitamin C (ASCORBIC ACID) 500 MG tablet Take 500 mg by mouth daily.      No results found for this or any previous visit (from the past 48 hour(s)). No results found.  ROS  There were no vitals taken for this visit. Physical Exam  Constitutional:  Well-developed thin Caucasian female in NAD.  HENT:   Mouth/Throat: Oropharynx is clear and moist.  She has upper and lower dentures.  Eyes: Conjunctivae are normal. No scleral icterus.  Neck: No thyromegaly present.  Cardiovascular: Normal rate, regular rhythm and normal heart sounds.   No murmur heard. Respiratory: Effort normal and breath sounds normal.  GI: Soft. She exhibits no distension and no mass. There is no tenderness.  Musculoskeletal: She exhibits no edema.  Lymphadenopathy:    She has no cervical adenopathy.  Neurological: She is alert.  Skin: Skin is warm and dry.     Assessment/Plan Solid food dysphagia secondary to recurrent distal esophageal stricture. Chronic GERD. EGD with ED under monitored anesthesia care as patient is difficult to sedate with conscious sedation.  Taylor,NAJEEB Cannon 06/03/2015, 7:15 AM

## 2015-06-03 NOTE — Anesthesia Procedure Notes (Signed)
Procedure Name: MAC Date/Time: 06/03/2015 7:32 AM Performed by: Vista Deck Pre-anesthesia Checklist: Patient identified, Emergency Drugs available, Suction available, Timeout performed and Patient being monitored Patient Re-evaluated:Patient Re-evaluated prior to inductionOxygen Delivery Method: Non-rebreather mask

## 2015-06-03 NOTE — Op Note (Signed)
EGD PROCEDURE REPORT  PATIENT:  Taylor Cannon  MR#:  [id number] Birthdate:  08-29-1934, 81 y.o., female Endoscopist:  Dr. Rogene Houston, MD  Procedure Date: 06/03/2015  Procedure:   EGD with ED  Indications:  Patient is 81 year old Caucasian female who has chronic GERD and history of esophageal stricture who presents with progressive solid food dysphagia. She has required multiple dilations in the past most recently on 11/30/2014. She now presents with progressive solid food dysphagia.            Informed Consent:  The risks, benefits, alternatives & imponderables which include, but are not limited to, bleeding, infection, perforation, drug reaction and potential missed lesion have been reviewed.  The potential for biopsy, lesion removal, esophageal dilation, etc. have also been discussed.  Questions have been answered.  All parties agreeable.  Please see history & physical in medical record for more information.  Medications:  Cetacaine spray topically for oropharyngeal anesthesia. Monitored anesthesia care. Please see anesthesia records for details  Description of procedure:  The endoscope was introduced through the mouth and advanced to the second portion of the duodenum without difficulty or limitations. The mucosal surfaces were surveyed very carefully during advancement of the scope and upon withdrawal.  Findings:  Esophagus: Mucosa of the esophagus was normal. High-grade stricture noted at GE junction without ulceration. Unable to pass the scope across it but able to see gastric mucosa. Balloon dilator was passed across it under direct vision and the stricture was partially dilated and examination completed. Stricture was further dilated as below. GEJ:  35 cm Hiatus:  40 cm Stomach:  Stomach was empty and distended very well with insufflation. Folds in the proximal stomach were normal. Examination of mucosa at gastric body, antrum, pyloric channel, angularis fundus and cardia was  unremarkable. Hernia was easily seen on this view. Duodenum:  Normal bulbar and post bulbar mucosa.  Therapeutic/Diagnostic Maneuvers Performed:   Esophageal stricture was initially partially dilated with balloon dilator in order to pass the scope across it. Once the examination was completed balloon dilator was advanced to the scope. Guidewire was pushed into gastric lumen. Balloon dilator was positioned across the stricture and insufflated to diameter of 15 mm and maintained for a few seconds. Balloon was repositioned and insufflated to a diameter of 16.5 mm and maintained for two minutes and then passed distally. Mucosal disruption noted. Balloon was deflated and withdrawn. I was able to pass the scope across the stricture with minimal resistance.  Complications:  None  EBL: Minimal  Impression: High-grade stricture at GE junction.  Stricture dilated with balloon to 16.5 mm. Moderate sliding hiatal hernia.   Recommendations:  Standard instructions given. Continue omeprazole at 20 mg by mouth every morning. Repeat dilation in 10-12 weeks hoping to dilate the stricture to 18 mm.  REHMAN,NAJEEB U  06/03/2015  8:03 AM  CC: Dr. Salli Real, [name] & Dr. No ref. provider found

## 2015-08-03 ENCOUNTER — Encounter (INDEPENDENT_AMBULATORY_CARE_PROVIDER_SITE_OTHER): Payer: Self-pay | Admitting: *Deleted

## 2015-08-03 ENCOUNTER — Other Ambulatory Visit (INDEPENDENT_AMBULATORY_CARE_PROVIDER_SITE_OTHER): Payer: Self-pay | Admitting: *Deleted

## 2015-08-03 ENCOUNTER — Telehealth (INDEPENDENT_AMBULATORY_CARE_PROVIDER_SITE_OTHER): Payer: Self-pay | Admitting: *Deleted

## 2015-08-03 DIAGNOSIS — K222 Esophageal obstruction: Secondary | ICD-10-CM

## 2015-08-03 NOTE — Telephone Encounter (Signed)
Referring MD/PCP: seepe   Procedure: egd/ed w/ propofol  Reason/Indication:  Esophageal stricture  Has patient had this procedure before?  Yes, 05/2015 -- epic  If so, when, by whom and where?    Is there a family history of colon cancer?  no  Who?  What age when diagnosed?    Is patient diabetic?   no      Does patient have prosthetic heart valve or mechanical valve?  no  Do you have a pacemaker?  no  Has patient ever had endocarditis? no  Has patient had joint replacement within last 12 months?  no  Does patient tend to be constipated or take laxatives? no  Does patient have a history of alcohol/drug use?  no  Is patient on Coumadin, Plavix and/or Aspirin? yes  Medications: see epic  Allergies: see epic  Medication Adjustment: asa 2 days  Procedure date & time: 08/19/15 at 1235

## 2015-08-08 NOTE — Telephone Encounter (Signed)
agree

## 2015-08-09 ENCOUNTER — Telehealth (INDEPENDENT_AMBULATORY_CARE_PROVIDER_SITE_OTHER): Payer: Self-pay | Admitting: *Deleted

## 2015-08-09 NOTE — Telephone Encounter (Signed)
Spoke to patient

## 2015-08-09 NOTE — Telephone Encounter (Signed)
Patient has questions about procedure on 12/16  Please call her back at [phone]

## 2015-08-12 NOTE — Patient Instructions (Signed)
Taylor Cannon  08/12/2015     @PREFPERIOPPHARMACY @   Your procedure is scheduled on 08/19/15.  Report to [facility] at 11.00 A.M.  Call this number if you have problems the morning of surgery:  [phone]   Remember:  Do not eat food or drink liquids after midnight.  Take these medicines the morning of surgery with A SIP OF WATER OMEPRAZOLE AND XANAX   Do not wear jewelry, make-up or nail polish.  Do not wear lotions, powders, or perfumes.  You may wear deodorant.  Do not shave 48 hours prior to surgery.  Men may shave face and neck.  Do not bring valuables to the hospital.  [facility] is not responsible for any belongings or valuables.  Contacts, dentures or bridgework may not be worn into surgery.  Leave your suitcase in the car.  After surgery it may be brought to your room.  For patients admitted to the hospital, discharge time will be determined by your treatment team.  Patients discharged the day of surgery will not be allowed to drive home.   Name and phone number of your driver:   FAMILY Special instructions:  NONE  Please read over the following fact sheets that you were given. Care and Recovery After Surgery     Esophagogastroduodenoscopy Esophagogastroduodenoscopy (EGD) is a procedure that is used to examine the lining of the esophagus, stomach, and first part of the small intestine (duodenum). A long, flexible, lighted tube with a camera attached (endoscope) is inserted down the throat to view these organs. This procedure is done to detect problems or abnormalities, such as inflammation, bleeding, ulcers, or growths, in order to treat them. The procedure lasts 5-20 minutes. It is usually an outpatient procedure, but it may need to be performed in a hospital in emergency cases. LET [facility] CARE PROVIDER KNOW ABOUT:  Any allergies you have.  All medicines you are taking, including vitamins, herbs, eye drops, creams, and over-the-counter  medicines.  Previous problems you or members of your family have had with the use of anesthetics.  Any blood disorders you have.  Previous surgeries you have had.  Medical conditions you have. RISKS AND COMPLICATIONS Generally, this is a safe procedure. However, problems can occur and include:  Infection.  Bleeding.  Tearing (perforation) of the esophagus, stomach, or duodenum.  Difficulty breathing or not being able to breathe.  Excessive sweating.  Spasms of the larynx.  Slowed heartbeat.  Low blood pressure. BEFORE THE PROCEDURE  Do not eat or drink anything after midnight on the night before the procedure or as directed by your health care provider.  Do not take your regular medicines before the procedure if your health care provider asks you not to. Ask your health care provider about changing or stopping those medicines.  If you wear dentures, be prepared to remove them before the procedure.  Arrange for someone to drive you home after the procedure. PROCEDURE  A numbing medicine (local anesthetic) may be sprayed in your throat for comfort and to stop you from gagging or coughing.  You will have an IV tube inserted in a vein in your hand or arm. You will receive medicines and fluids through this tube.  You will be given a medicine to relax you (sedative).  A pain reliever will be given through the IV tube.  A mouth guard may be placed in your mouth to protect your teeth and to keep you from biting on the endoscope.  You will be asked to lie on your left side.  The endoscope will be inserted down your throat and into your esophagus, stomach, and duodenum.  Air will be put through the endoscope to allow your health care provider to clearly view the lining of your esophagus.  The lining of your esophagus, stomach, and duodenum will be examined. During the exam, your health care provider may:  Remove tissue to be examined under a microscope (biopsy) for  inflammation, infection, or other medical problems.  Remove growths.  Remove objects (foreign bodies) that are stuck.  Treat any bleeding with medicines or other devices that stop tissues from bleeding (hot cautery, clipping devices).  Widen (dilate) or stretch narrowed areas of your esophagus and stomach.  The endoscope will be withdrawn. AFTER THE PROCEDURE  You will be taken to a recovery area for observation. Your blood pressure, heart rate, breathing rate, and blood oxygen level will be monitored often until the medicines you were given have worn off.  Do not eat or drink anything until the numbing medicine has worn off and your gag reflex has returned. You may choke.  Your health care provider should be able to discuss his or her findings with you. It will take longer to discuss the test results if any biopsies were taken.   This information is not intended to replace advice given to you by your health care provider. Make sure you discuss any questions you have with your health care provider.   Document Released: 12/21/2004 Document Revised: 09/10/2014 Document Reviewed: 07/23/2012 Elsevier Interactive Patient Education [employer].

## 2015-08-15 ENCOUNTER — Encounter (HOSPITAL_COMMUNITY)
Admission: RE | Admit: 2015-08-15 | Discharge: 2015-08-15 | Disposition: A | Discharge status: Home / Self Care | Payer: Medicare Other | Source: Ambulatory Visit | Attending: Internal Medicine | Admitting: Internal Medicine

## 2015-08-17 ENCOUNTER — Encounter (INDEPENDENT_AMBULATORY_CARE_PROVIDER_SITE_OTHER): Payer: Self-pay | Admitting: *Deleted

## 2015-08-17 ENCOUNTER — Telehealth (INDEPENDENT_AMBULATORY_CARE_PROVIDER_SITE_OTHER): Payer: Self-pay | Admitting: *Deleted

## 2015-08-17 ENCOUNTER — Other Ambulatory Visit (INDEPENDENT_AMBULATORY_CARE_PROVIDER_SITE_OTHER): Payer: Self-pay | Admitting: Internal Medicine

## 2015-08-17 DIAGNOSIS — K222 Esophageal obstruction: Secondary | ICD-10-CM

## 2015-08-17 NOTE — Telephone Encounter (Signed)
Referring MD/PCP: seepe   Procedure: egd/ed with propofol  Reason/Indication:  Esophageal stricture  Has patient had this procedure before?  Yes, 05/2015  If so, when, by whom and where?    Is there a family history of colon cancer?  no  Who?  What age when diagnosed?    Is patient diabetic?   no      Does patient have prosthetic heart valve or mechanical valve?  no  Do you have a pacemaker?  no  Has patient ever had endocarditis? no  Has patient had joint replacement within last 12 months?  no  Does patient tend to be constipated or take laxatives? no  Does patient have a history of alcohol/drug use?  no  Is patient on Coumadin, Plavix and/or Aspirin? yes  Medications: see epic  Allergies: see epic  Medication Adjustment: asa 2 days  Procedure date & time: 09/09/15 at 730 pre-op 09/07/15 at 1:15

## 2015-08-17 NOTE — Telephone Encounter (Signed)
agree

## 2015-08-19 ENCOUNTER — Encounter (HOSPITAL_COMMUNITY): Admission: RE | Payer: Self-pay | Source: Ambulatory Visit

## 2015-08-19 ENCOUNTER — Ambulatory Visit (HOSPITAL_COMMUNITY): Admission: RE | Admit: 2015-08-19 | Payer: Medicare Other | Source: Ambulatory Visit | Admitting: Internal Medicine

## 2015-08-19 SURGERY — ESOPHAGOGASTRODUODENOSCOPY (EGD) WITH PROPOFOL
Anesthesia: Monitor Anesthesia Care

## 2015-09-06 NOTE — Patient Instructions (Signed)
Taylor Cannon  09/06/2015     @PREFPERIOPPHARMACY @   Your procedure is scheduled on 09/09/2015.  Report to [facility] at 7:00 A.M.  Call this number if you have problems the morning of surgery:  [phone]   Remember:  Do not eat food or drink liquids after midnight.  Take these medicines the morning of surgery with A SIP OF WATER Xanax, Prilosec   Do not wear jewelry, make-up or nail polish.  Do not wear lotions, powders, or perfumes.  You may wear deodorant.  Do not shave 48 hours prior to surgery.  Men may shave face and neck.  Do not bring valuables to the hospital.  [facility] is not responsible for any belongings or valuables.  Contacts, dentures or bridgework may not be worn into surgery.  Leave your suitcase in the car.  After surgery it may be brought to your room.  For patients admitted to the hospital, discharge time will be determined by your treatment team.  Patients discharged the day of surgery will not be allowed to drive home.    Please read over the following fact sheets that you were given. Anesthesia Post-op Instructions     PATIENT INSTRUCTIONS POST-ANESTHESIA  IMMEDIATELY FOLLOWING SURGERY:  Do not drive or operate machinery for the first twenty four hours after surgery.  Do not make any important decisions for twenty four hours after surgery or while taking narcotic pain medications or sedatives.  If you develop intractable nausea and vomiting or a severe headache please notify your doctor immediately.  FOLLOW-UP:  Please make an appointment with your surgeon as instructed. You do not need to follow up with anesthesia unless specifically instructed to do so.  WOUND CARE INSTRUCTIONS (if applicable):  Keep a dry clean dressing on the anesthesia/puncture wound site if there is drainage.  Once the wound has quit draining you may leave it open to air.  Generally you should leave the bandage intact for twenty four hours unless there is drainage.  If the  epidural site drains for more than 36-48 hours please call the anesthesia department.  QUESTIONS?:  Please feel free to call your physician or the hospital operator if you have any questions, and they will be happy to assist you.      Esophageal Dilatation Esophageal dilatation is a procedure to open a blocked or narrowed part of the esophagus. The esophagus is the long tube in your throat that carries food and liquid from your mouth to your stomach. The procedure is also called esophageal dilation.  You may need this procedure if you have a buildup of scar tissue in your esophagus that makes it difficult, painful, or even impossible to swallow. This can be caused by gastroesophageal reflux disease (GERD). In rare cases, people need this procedure because they have cancer of the esophagus or a problem with the way food moves through the esophagus. Sometimes you may need to have another dilatation to enlarge the opening of the esophagus gradually. LET [facility] CARE PROVIDER KNOW ABOUT:   Any allergies you have.  All medicines you are taking, including vitamins, herbs, eye drops, creams, and over-the-counter medicines.  Previous problems you or members of your family have had with the use of anesthetics.  Any blood disorders you have.  Previous surgeries you have had.  Medical conditions you have.  Any antibiotic medicines you are required to take before dental procedures. RISKS AND COMPLICATIONS Generally, this is a safe procedure. However, problems can  occur and include:  Bleeding from a tear in the lining of the esophagus.  A hole (perforation) in the esophagus. BEFORE THE PROCEDURE  Do not eat or drink anything after midnight on the night before the procedure or as directed by your health care provider.  Ask your health care provider about changing or stopping your regular medicines. This is especially important if you are taking diabetes medicines or blood thinners.  Plan to  have someone take you home after the procedure. PROCEDURE   You will be given a medicine that makes you relaxed and sleepy (sedative).  A medicine may be sprayed or gargled to numb the back of the throat.  Your health care provider can use various instruments to do an esophageal dilatation. During the procedure, the instrument used will be placed in your mouth and passed down into your esophagus. Options include:  Simple dilators. This instrument is carefully placed in the esophagus to stretch it.  Guided wire bougies. In this method, a flexible tube (endoscope) is used to insert a wire into the esophagus. The dilator is passed over this wire to enlarge the esophagus. Then the wire is removed.  Balloon dilators. An endoscope with a small balloon at the end is passed down into the esophagus. Inflating the balloon gently stretches the esophagus and opens it up. AFTER THE PROCEDURE  Your blood pressure, heart rate, breathing rate, and blood oxygen level will be monitored often until the medicines you were given have worn off.  Your throat may feel slightly sore and will probably still feel numb. This will improve slowly over time.  You will not be allowed to eat or drink until the throat numbness has resolved.  If this is a same-day procedure, you may be allowed to go home once you have been able to drink, urinate, and sit on the edge of the bed without nausea or dizziness.  If this is a same-day procedure, you should have a friend or family member with you for the next 24 hours after the procedure.   This information is not intended to replace advice given to you by your health care provider. Make sure you discuss any questions you have with your health care provider.   Document Released: 10/11/2005 Document Revised: 09/10/2014 Document Reviewed: 12/30/2013 Elsevier Interactive Patient Education 2016 [employer]. Esophagogastroduodenoscopy Esophagogastroduodenoscopy (EGD) is a  procedure that is used to examine the lining of the esophagus, stomach, and first part of the small intestine (duodenum). A long, flexible, lighted tube with a camera attached (endoscope) is inserted down the throat to view these organs. This procedure is done to detect problems or abnormalities, such as inflammation, bleeding, ulcers, or growths, in order to treat them. The procedure lasts 5-20 minutes. It is usually an outpatient procedure, but it may need to be performed in a hospital in emergency cases. LET [facility] CARE PROVIDER KNOW ABOUT:  Any allergies you have.  All medicines you are taking, including vitamins, herbs, eye drops, creams, and over-the-counter medicines.  Previous problems you or members of your family have had with the use of anesthetics.  Any blood disorders you have.  Previous surgeries you have had.  Medical conditions you have. RISKS AND COMPLICATIONS Generally, this is a safe procedure. However, problems can occur and include:  Infection.  Bleeding.  Tearing (perforation) of the esophagus, stomach, or duodenum.  Difficulty breathing or not being able to breathe.  Excessive sweating.  Spasms of the larynx.  Slowed heartbeat.  Low  blood pressure. BEFORE THE PROCEDURE  Do not eat or drink anything after midnight on the night before the procedure or as directed by your health care provider.  Do not take your regular medicines before the procedure if your health care provider asks you not to. Ask your health care provider about changing or stopping those medicines.  If you wear dentures, be prepared to remove them before the procedure.  Arrange for someone to drive you home after the procedure. PROCEDURE  A numbing medicine (local anesthetic) may be sprayed in your throat for comfort and to stop you from gagging or coughing.  You will have an IV tube inserted in a vein in your hand or arm. You will receive medicines and fluids through this  tube.  You will be given a medicine to relax you (sedative).  A pain reliever will be given through the IV tube.  A mouth guard may be placed in your mouth to protect your teeth and to keep you from biting on the endoscope.  You will be asked to lie on your left side.  The endoscope will be inserted down your throat and into your esophagus, stomach, and duodenum.  Air will be put through the endoscope to allow your health care provider to clearly view the lining of your esophagus.  The lining of your esophagus, stomach, and duodenum will be examined. During the exam, your health care provider may:  Remove tissue to be examined under a microscope (biopsy) for inflammation, infection, or other medical problems.  Remove growths.  Remove objects (foreign bodies) that are stuck.  Treat any bleeding with medicines or other devices that stop tissues from bleeding (hot cautery, clipping devices).  Widen (dilate) or stretch narrowed areas of your esophagus and stomach.  The endoscope will be withdrawn. AFTER THE PROCEDURE  You will be taken to a recovery area for observation. Your blood pressure, heart rate, breathing rate, and blood oxygen level will be monitored often until the medicines you were given have worn off.  Do not eat or drink anything until the numbing medicine has worn off and your gag reflex has returned. You may choke.  Your health care provider should be able to discuss his or her findings with you. It will take longer to discuss the test results if any biopsies were taken.   This information is not intended to replace advice given to you by your health care provider. Make sure you discuss any questions you have with your health care provider.   Document Released: 12/21/2004 Document Revised: 09/10/2014 Document Reviewed: 07/23/2012 Elsevier Interactive Patient Education [employer].

## 2015-09-07 ENCOUNTER — Encounter (HOSPITAL_COMMUNITY): Payer: Self-pay

## 2015-09-07 ENCOUNTER — Other Ambulatory Visit (HOSPITAL_COMMUNITY): Payer: Medicare Other

## 2015-09-07 ENCOUNTER — Encounter (HOSPITAL_COMMUNITY)
Admission: RE | Admit: 2015-09-07 | Discharge: 2015-09-07 | Disposition: A | Discharge status: Home / Self Care | Payer: Medicare FFS | Source: Ambulatory Visit | Attending: Internal Medicine | Admitting: Internal Medicine

## 2015-09-07 VITALS — BP 134/65 | HR 94 | Temp 98.3°F | Resp 20 | Ht 68.0 in | Wt 107.0 lb

## 2015-09-07 DIAGNOSIS — K2990 Gastroduodenitis, unspecified, without bleeding: Secondary | ICD-10-CM | POA: Diagnosis not present

## 2015-09-07 DIAGNOSIS — Z7982 Long term (current) use of aspirin: Secondary | ICD-10-CM | POA: Diagnosis not present

## 2015-09-07 DIAGNOSIS — Z8673 Personal history of transient ischemic attack (TIA), and cerebral infarction without residual deficits: Secondary | ICD-10-CM | POA: Diagnosis not present

## 2015-09-07 DIAGNOSIS — K219 Gastro-esophageal reflux disease without esophagitis: Secondary | ICD-10-CM | POA: Diagnosis not present

## 2015-09-07 DIAGNOSIS — Z9981 Dependence on supplemental oxygen: Secondary | ICD-10-CM | POA: Diagnosis not present

## 2015-09-07 DIAGNOSIS — F419 Anxiety disorder, unspecified: Secondary | ICD-10-CM | POA: Diagnosis not present

## 2015-09-07 DIAGNOSIS — K449 Diaphragmatic hernia without obstruction or gangrene: Secondary | ICD-10-CM | POA: Diagnosis not present

## 2015-09-07 DIAGNOSIS — F329 Major depressive disorder, single episode, unspecified: Secondary | ICD-10-CM | POA: Diagnosis not present

## 2015-09-07 DIAGNOSIS — J449 Chronic obstructive pulmonary disease, unspecified: Secondary | ICD-10-CM | POA: Diagnosis not present

## 2015-09-07 DIAGNOSIS — K222 Esophageal obstruction: Secondary | ICD-10-CM | POA: Diagnosis present

## 2015-09-07 DIAGNOSIS — Z888 Allergy status to other drugs, medicaments and biological substances status: Secondary | ICD-10-CM | POA: Diagnosis not present

## 2015-09-07 LAB — BASIC METABOLIC PANEL
Anion gap: 7 (ref 5–15)
BUN: 8 mg/dL (ref 6–20)
CO2: 28 mmol/L (ref 22–32)
Calcium: 9.2 mg/dL (ref 8.9–10.3)
Chloride: 94 mmol/L — ABNORMAL LOW (ref 101–111)
Creatinine, Ser: 0.63 mg/dL (ref 0.44–1.00)
GFR calc Af Amer: >60 mL/min (ref >60)
GFR calc non Af Amer: >60 mL/min (ref >60)
Glucose, Bld: 121 mg/dL — ABNORMAL HIGH (ref 65–99)
Potassium: 5.3 mmol/L — ABNORMAL HIGH (ref 3.5–5.1)
Sodium: 129 mmol/L — ABNORMAL LOW (ref 135–145)

## 2015-09-07 LAB — CBC
HCT: 46.4 % — ABNORMAL HIGH (ref 36.0–46.0)
Hemoglobin: 14.7 g/dL (ref 12.0–15.0)
MCH: 23.3 pg — ABNORMAL LOW (ref 26.0–34.0)
MCHC: 31.7 g/dL (ref 30.0–36.0)
MCV: 73.4 fL — ABNORMAL LOW (ref 78.0–100.0)
Platelets: 605 10*3/uL — ABNORMAL HIGH (ref 150–400)
RBC: 6.32 MIL/uL — ABNORMAL HIGH (ref 3.87–5.11)
RDW: 18.4 % — ABNORMAL HIGH (ref 11.5–15.5)
WBC: 7.6 10*3/uL (ref 4.0–10.5)

## 2015-09-07 NOTE — Pre-Procedure Instructions (Signed)
Patient given information to sign up for my chart at home. 

## 2015-09-09 ENCOUNTER — Ambulatory Visit (HOSPITAL_COMMUNITY): Payer: Medicare FFS | Admitting: Anesthesiology

## 2015-09-09 ENCOUNTER — Encounter (HOSPITAL_COMMUNITY)
Admission: RE | Disposition: A | Discharge status: Home Health | Payer: Self-pay | Source: Ambulatory Visit | Attending: Internal Medicine

## 2015-09-09 ENCOUNTER — Encounter (HOSPITAL_COMMUNITY): Payer: Self-pay | Admitting: *Deleted

## 2015-09-09 ENCOUNTER — Ambulatory Visit (HOSPITAL_COMMUNITY)
Admission: RE | Admit: 2015-09-09 | Discharge: 2015-09-09 | Disposition: A | Discharge status: Home Health | Payer: Medicare FFS | Source: Ambulatory Visit | Attending: Internal Medicine | Admitting: Internal Medicine

## 2015-09-09 DIAGNOSIS — K222 Esophageal obstruction: Secondary | ICD-10-CM | POA: Diagnosis not present

## 2015-09-09 DIAGNOSIS — J449 Chronic obstructive pulmonary disease, unspecified: Secondary | ICD-10-CM | POA: Insufficient documentation

## 2015-09-09 DIAGNOSIS — K219 Gastro-esophageal reflux disease without esophagitis: Secondary | ICD-10-CM | POA: Insufficient documentation

## 2015-09-09 DIAGNOSIS — F419 Anxiety disorder, unspecified: Secondary | ICD-10-CM | POA: Insufficient documentation

## 2015-09-09 DIAGNOSIS — K2990 Gastroduodenitis, unspecified, without bleeding: Secondary | ICD-10-CM | POA: Diagnosis not present

## 2015-09-09 DIAGNOSIS — R1319 Other dysphagia: Secondary | ICD-10-CM | POA: Diagnosis not present

## 2015-09-09 DIAGNOSIS — F329 Major depressive disorder, single episode, unspecified: Secondary | ICD-10-CM | POA: Insufficient documentation

## 2015-09-09 DIAGNOSIS — Z8673 Personal history of transient ischemic attack (TIA), and cerebral infarction without residual deficits: Secondary | ICD-10-CM | POA: Insufficient documentation

## 2015-09-09 DIAGNOSIS — K449 Diaphragmatic hernia without obstruction or gangrene: Secondary | ICD-10-CM | POA: Insufficient documentation

## 2015-09-09 DIAGNOSIS — Z888 Allergy status to other drugs, medicaments and biological substances status: Secondary | ICD-10-CM | POA: Insufficient documentation

## 2015-09-09 DIAGNOSIS — K2960 Other gastritis without bleeding: Secondary | ICD-10-CM | POA: Diagnosis not present

## 2015-09-09 DIAGNOSIS — Z9981 Dependence on supplemental oxygen: Secondary | ICD-10-CM | POA: Insufficient documentation

## 2015-09-09 DIAGNOSIS — Z7982 Long term (current) use of aspirin: Secondary | ICD-10-CM | POA: Insufficient documentation

## 2015-09-09 HISTORY — PX: ESOPHAGOGASTRODUODENOSCOPY (EGD) WITH PROPOFOL: SHX5813

## 2015-09-09 HISTORY — PX: ESOPHAGEAL DILATION: SHX303

## 2015-09-09 SURGERY — ESOPHAGOGASTRODUODENOSCOPY (EGD) WITH PROPOFOL
Anesthesia: Monitor Anesthesia Care

## 2015-09-09 MED ORDER — MIDAZOLAM HCL 2 MG/2ML IJ SOLN
1.0000 mg | INTRAMUSCULAR | Status: DC | PRN
  Administered 2015-09-09: 2 mg via INTRAVENOUS

## 2015-09-09 MED ORDER — PROPOFOL 10 MG/ML IV BOLUS
INTRAVENOUS | Status: AC
  Filled 2015-09-09: qty 40

## 2015-09-09 MED ORDER — ONDANSETRON HCL 4 MG/2ML IJ SOLN
4.0000 mg | Freq: Once | INTRAMUSCULAR | Status: AC
  Administered 2015-09-09: 4 mg via INTRAVENOUS

## 2015-09-09 MED ORDER — ONDANSETRON HCL 4 MG/2ML IJ SOLN
INTRAMUSCULAR | Status: AC
  Filled 2015-09-09: qty 2

## 2015-09-09 MED ORDER — PROPOFOL 500 MG/50ML IV EMUL
INTRAVENOUS | Status: DC | PRN
  Administered 2015-09-09: 75 ug/kg/min via INTRAVENOUS

## 2015-09-09 MED ORDER — ONDANSETRON HCL 4 MG/2ML IJ SOLN: 4.0000 mg | Freq: Once | INTRAMUSCULAR | Status: DC | PRN

## 2015-09-09 MED ORDER — MIDAZOLAM HCL 2 MG/2ML IJ SOLN
INTRAMUSCULAR | Status: AC
  Filled 2015-09-09: qty 4

## 2015-09-09 MED ORDER — MIDAZOLAM HCL 2 MG/2ML IJ SOLN
INTRAMUSCULAR | Status: AC
  Filled 2015-09-09: qty 2

## 2015-09-09 MED ORDER — FENTANYL CITRATE (PF) 100 MCG/2ML IJ SOLN: 25.0000 ug | INTRAMUSCULAR | Status: DC | PRN

## 2015-09-09 MED ORDER — MIDAZOLAM HCL 5 MG/5ML IJ SOLN
INTRAMUSCULAR | Status: DC | PRN
  Administered 2015-09-09: 2 mg via INTRAVENOUS

## 2015-09-09 MED ORDER — BUTAMBEN-TETRACAINE-BENZOCAINE 2-2-14 % EX AERO
1.0000 | INHALATION_SPRAY | Freq: Once | CUTANEOUS | Status: AC
  Administered 2015-09-09: 1 via TOPICAL
  Filled 2015-09-09: qty 20

## 2015-09-09 MED ORDER — LACTATED RINGERS IV SOLN
INTRAVENOUS | Status: DC
  Administered 2015-09-09: 08:00:00 via INTRAVENOUS

## 2015-09-09 MED ORDER — FENTANYL CITRATE (PF) 100 MCG/2ML IJ SOLN
25.0000 ug | INTRAMUSCULAR | Status: AC
  Administered 2015-09-09 (×2): 25 ug via INTRAVENOUS

## 2015-09-09 MED ORDER — FENTANYL CITRATE (PF) 100 MCG/2ML IJ SOLN
INTRAMUSCULAR | Status: AC
  Filled 2015-09-09: qty 2

## 2015-09-09 NOTE — H&P (Signed)
Taylor Cannon is an 82 y.o. female.   Chief Complaint: Patient's here for EGD and ED. HPI: Patient is 82 year old Caucasian female was chronic GERD complicated by distal esophageal stricture which has been dilated on multiple occasions. Last dilation was on 06/03/2015 when the stricture was dilated to 16.5 mm. She was supposed to come back last month but was not able to. She has noted dysphagia with solids over the last week or so. She feels heartburn is well controlled with therapy. She denies nausea vomiting or melena. She complains of anorexia and unable to gain weight.  Past Medical History  Diagnosis Date  . GERD (gastroesophageal reflux disease)   . Depression   . COPD (chronic obstructive pulmonary disease) ([place])   . On home oxygen therapy     2L via [place] at night  . TIA (transient ischemic attack)   . Anxiety     Past Surgical History  Procedure Laterality Date  . Appendectomy    . Cervical cone biopsy    . Tubal ligation    . Cardiac electrophysiology study and ablation    . Cataract extraction Bilateral   . Esophagogastroduodenoscopy (egd) with propofol N/A 07/24/2013    Procedure: ESOPHAGOGASTRODUODENOSCOPY (EGD) WITH PROPOFOL UNDER FLUOROSCOPY;  Surgeon: Rogene Houston, MD;  Location: [facility];  Service: Endoscopy;  Laterality: N/A;  . Balloon dilation N/A 07/24/2013    Procedure: BALLOON DILATION to 13.76m;  Surgeon: NRogene Houston MD;  Location: [facility];  Service: Endoscopy;  Laterality: N/A;  . Esophagogastroduodenoscopy (egd) with esophageal dilation N/A 08/20/2013    Procedure: ESOPHAGOGASTRODUODENOSCOPY (EGD) WITH ESOPHAGEAL DILATION;  Surgeon: NRogene Houston MD;  Location: [facility];  Service: Endoscopy;  Laterality: N/A;  345-moved to 730 Ann notified pt  . Esophagogastroduodenoscopy N/A 11/11/2014    Procedure: ESOPHAGOGASTRODUODENOSCOPY (EGD);  Surgeon: NRogene Houston MD;  [facility]: [facility];  Service: Endoscopy;  Laterality: N/A;  125-rescheduled  11/11/14 @ 10:30am Ann notified pt  . Balloon dilation N/A 11/11/2014    Procedure: BALLOON DILATION;  Surgeon: NRogene Houston MD;  [facility]: [facility];  Service: Endoscopy;  Laterality: N/A;  . Esophagogastroduodenoscopy (egd) with propofol N/A 11/30/2014    Procedure: ESOPHAGOGASTRODUODENOSCOPY (EGD) WITH PROPOFOL;  Surgeon: NRogene Houston MD;  Location: [facility];  Service: Endoscopy;  Laterality: N/A;  fluoro not needed  . Esophageal dilation N/A 11/30/2014    Procedure: ESOPHAGEAL DILATION;  Surgeon: NRogene Houston MD;  Location: [facility];  Service: Endoscopy;  Laterality: N/A;  Balloon, 15, 16.5  . Esophagogastroduodenoscopy (egd) with propofol N/A 06/03/2015    Procedure: ESOPHAGOGASTRODUODENOSCOPY (EGD) WITH PROPOFOL; HIATUS AT 35 CM AND GE JUNCTION 40 CM;  Surgeon: NRogene Houston MD;  Location: [facility];  Service: Endoscopy;  Laterality: N/A;  . Esophageal dilation N/A 06/03/2015    Procedure: ESOPHAGEAL DILATION WITH 16.5 MM BALLOON ;  Surgeon: NRogene Houston MD;  Location: [facility];  Service: Endoscopy;  Laterality: N/A;    History reviewed. No pertinent family history. Social History:  reports that she has been smoking Cigarettes.  She has a 55 pack-year smoking history. She does not have any smokeless tobacco history on file. She reports that she drinks about 8.4 oz of alcohol per week. She reports that she does not use illicit drugs.  Allergies:  Allergies  Allergen Reactions  . Other Nausea Only    Steroids    Medications Prior to Admission  Medication Sig Dispense Refill  . ALPRAZolam (Duanne Moron  0.25 MG tablet Take 0.25 mg by mouth 2 (two) times daily as needed for anxiety.    Marland Kitchen aspirin 81 MG tablet Take 81 mg by mouth daily.    . calcium-vitamin D (OSCAL WITH D) 500-200 MG-UNIT per tablet Take 1 tablet by mouth daily with breakfast.    . mirtazapine (REMERON) 30 MG tablet Take 1 tablet by mouth at bedtime.  2  . Multiple Vitamins-Minerals (MULTIVITAMINS THER. W/MINERALS)  TABS tablet Take 1 tablet by mouth daily.    Marland Kitchen omeprazole (PRILOSEC) 20 MG capsule Take 20 mg by mouth daily.    . vitamin C (ASCORBIC ACID) 500 MG tablet Take 500 mg by mouth daily.      Results for orders placed or performed during the hospital encounter of 09/07/15 (from the past 48 hour(s))  CBC     Status: Abnormal   Collection Time: 09/07/15  1:30 PM  Result Value Ref Range   WBC 7.6 4.0 - 10.5 K/uL   RBC 6.32 (H) 3.87 - 5.11 MIL/uL   Hemoglobin 14.7 12.0 - 15.0 g/dL   HCT 46.4 (H) 36.0 - 46.0 %   MCV 73.4 (L) 78.0 - 100.0 fL   MCH 23.3 (L) 26.0 - 34.0 pg   MCHC 31.7 30.0 - 36.0 g/dL   RDW 18.4 (H) 11.5 - 15.5 %   Platelets 605 (H) 150 - 400 K/uL  Basic metabolic panel     Status: Abnormal   Collection Time: 09/07/15  1:30 PM  Result Value Ref Range   Sodium 129 (L) 135 - 145 mmol/L   Potassium 5.3 (H) 3.5 - 5.1 mmol/L   Chloride 94 (L) 101 - 111 mmol/L   CO2 28 22 - 32 mmol/L   Glucose, Bld 121 (H) 65 - 99 mg/dL   BUN 8 6 - 20 mg/dL   Creatinine, Ser 0.63 0.44 - 1.00 mg/dL   Calcium 9.2 8.9 - 10.3 mg/dL   GFR calc non Af Amer >60 >60 mL/min   GFR calc Af Amer >60 >60 mL/min    Comment: (NOTE) The eGFR has been calculated using the CKD EPI equation. This calculation has not been validated in all clinical situations. eGFR's persistently <60 mL/min signify possible Chronic Kidney Disease.    Anion gap 7 5 - 15   No results found.  ROS  Blood pressure 130/84, pulse 107, temperature 97.9 F (36.6 C), temperature source Oral, resp. rate 27, SpO2 97 %. Physical Exam  Constitutional:  Well-developed thin Caucasian female in NAD.  HENT:  Mouth/Throat: Oropharynx is clear and moist.  Eyes: Conjunctivae are normal.  Neck: No thyromegaly present.  Cardiovascular: Normal rate, regular rhythm and normal heart sounds.   No murmur heard. Respiratory: She has no wheezes. She has no rales.  pectum carinatum  GI: She exhibits no distension and no mass. There is no  tenderness.  Musculoskeletal: She exhibits no edema.  Lymphadenopathy:    She has no cervical adenopathy.  Neurological: She is alert.  Skin: Skin is warm and dry.     Assessment/Plan Solid food dysphagia. Benign esophageal stricture secondary to chronic GERD. EGD with ED under monitored anesthesia care.  Mayan Kloepfer U 09/09/2015, 8:15 AM

## 2015-09-09 NOTE — Op Note (Signed)
  EGD PROCEDURE REPORT  PATIENT:  Taylor Cannon  MR#:  [id number] Birthdate:  1934/01/17, 82 y.o., female Endoscopist:  Dr. Rogene Houston, MD Procedure Date: 09/09/2015  Procedure:   EGD with ED  Indications:  Patient is 82 year old Caucasian female who has distal esophageal stricture who is returning for repeat dilation. Last dilation was 1 06/03/2015 when stricture was dilated to 16.5 mm. She is starting to experience dysphagia again.            Informed Consent:  The risks, benefits, alternatives & imponderables which include, but are not limited to, bleeding, infection, perforation, drug reaction and potential missed lesion have been reviewed.  The potential for biopsy, lesion removal, esophageal dilation, etc. have also been discussed.  Questions have been answered.  All parties agreeable.  Please see history & physical in medical record for more information.  Medications:  Cetacaine spray topically for oropharyngeal anesthesia Monitored anesthesia care. Please see anesthesia records for details.  Description of procedure:  The endoscope was introduced through the mouth and advanced to the second portion of the duodenum without difficulty or limitations. The mucosal surfaces were surveyed very carefully during advancement of the scope and upon withdrawal.  Findings:  Esophagus:  Mucosa of the esophagus was normal. High-grade stricture noted at GE junction with pale mucosa. Unable to pass the scope across the stricture. This stricture was dilated as below. GEJ:  35 cm Hiatus:  48 cm Stomach:  Stomach was empty and distended very well with insufflation. Folds in the proximal stomach were normal. Focal erythema noted at the level of hiatus along with anterior erosions. Pyloric channel was patent. Angularis was unremarkable. Hernia was easily seen on retroflex view. Duodenum:  Multiple bulbar erosions noted.  Therapeutic/Diagnostic Maneuvers Performed:   Balloon dilator was passed across  the stricture under direct vision. Stricture was dilated to 15 mm. The scope was passed distally. Guidewire was pushed into gastric lumen. Balloon data was repositioned and insufflated to a diameter of 16.5 mm. Balloon was maintained for 2 minutes and then deflated and withdrawn. I was able to pass the scope across the stricture with minimal resistance.  Complications: None  EBL: Mild   Impression: High-grade stricture at distal esophagus. Stricture dilated with balloon dilator to 16.5 mm. Large sliding hiatal hernia. Erosive gastroduodenitis.  Recommendations:  Standard instructions given. Resume aspirin on 09/13/2015. H. pylori serology. Repeat dilation in 4-6 weeks.  REHMAN,NAJEEB U  09/09/2015  8:59 AM  CC: Dr. Salli Real, [name] & Dr. No ref. provider found

## 2015-09-09 NOTE — Discharge Instructions (Signed)
Resume aspirin on 09/13/2015. Resume other medications as before. No driving for 24 hours. Physician will call with results of blood test. Repeat dilation in 4-6 weeks. Esophagogastroduodenoscopy, Care After Refer to this sheet in the next few weeks. These instructions provide you with information about caring for yourself after your procedure. Your health care provider may also give you more specific instructions. Your treatment has been planned according to current medical practices, but problems sometimes occur. Call your health care provider if you have any problems or questions after your procedure.  Dr Laural Golden [phone]  WHAT TO EXPECT AFTER THE PROCEDURE After your procedure, it is typical to feel:  Soreness in your throat.  Pain with swallowing.  Sick to your stomach (nauseous).  Bloated.  Dizzy.  Fatigued. HOME CARE INSTRUCTIONS  Do not eat or drink anything until the numbing medicine (local anesthetic) has worn off and your gag reflex has returned. You will know that the local anesthetic has worn off when you can swallow comfortably.  Do not drive or operate machinery until directed by your health care provider.  Take medicines only as directed by your health care provider. SEEK MEDICAL CARE IF:   You cannot stop coughing.  You are not urinating at all or less than usual. SEEK IMMEDIATE MEDICAL CARE IF:  You have difficulty swallowing.  You cannot eat or drink.  You have worsening throat or chest pain.  You have dizziness or lightheadedness or you faint.  You have nausea or vomiting.  You have chills.  You have a fever.  You have severe abdominal pain.  You have black, tarry, or bloody stools.   This information is not intended to replace advice given to you by your health care provider. Make sure you discuss any questions you have with your health care provider.   Document Released: 08/06/2012 Document Revised: 09/10/2014 Document Reviewed:  08/06/2012 Elsevier Interactive Patient Education [employer].

## 2015-09-09 NOTE — Progress Notes (Signed)
Swallowing without difficulty. H-pylori drawn and sent to lab for results. Awake. O2 sat 88% on room air. Wears O2 at night at home. O2 started with nasal cannula at 2 l/m. O2 sat increased to 99%. Denies pain.

## 2015-09-09 NOTE — Transfer of Care (Signed)
Immediate Anesthesia Transfer of Care Note  Patient: Taylor Cannon  Procedure(s) Performed: Procedure(s) with comments: ESOPHAGOGASTRODUODENOSCOPY (EGD) WITH PROPOFOL (N/A) - 8:15 ESOPHAGEAL DILATION (N/A)  Patient Location: PACU  Anesthesia Type:MAC  Level of Consciousness: awake and patient cooperative  Airway & Oxygen Therapy: Patient Spontanous Breathing and Patient connected to face mask oxygen  Post-op Assessment: Report given to RN, Post -op Vital signs reviewed and stable and Patient moving all extremities  Post vital signs: Reviewed and stable  Last Vitals:  Filed Vitals:   09/09/15 0820 09/09/15 0825  BP:  162/85  Pulse:    Temp:    Resp: 16 17    Complications: No apparent anesthesia complications

## 2015-09-09 NOTE — Anesthesia Preprocedure Evaluation (Signed)
Anesthesia Evaluation  Patient identified by MRN, date of birth, ID band Patient awake    Reviewed: Allergy & Precautions, NPO status , Patient's Chart, lab work & pertinent test results  Airway Mallampati: III  TM Distance: <3 FB     Dental  (+) Edentulous Upper, Edentulous Lower, Lower Dentures, Upper Dentures   Pulmonary COPD, Current Smoker,    Pulmonary exam normal        Cardiovascular negative cardio ROS Normal cardiovascular exam     Neuro/Psych PSYCHIATRIC DISORDERS Anxiety Depression TIA   GI/Hepatic GERD  ,  Endo/Other    Renal/GU      Musculoskeletal   Abdominal Normal abdominal exam  (+)   Peds  Hematology   Anesthesia Other Findings   Reproductive/Obstetrics                             Anesthesia Physical Anesthesia Plan  ASA: III  Anesthesia Plan: MAC   Post-op Pain Management:    Induction: Intravenous  Airway Management Planned: Mask  Additional Equipment:   Intra-op Plan:   Post-operative Plan:   Informed Consent: I have reviewed the patients History and Physical, chart, labs and discussed the procedure including the risks, benefits and alternatives for the proposed anesthesia with the patient or authorized representative who has indicated his/her understanding and acceptance.     Plan Discussed with: CRNA  Anesthesia Plan Comments:         Anesthesia Quick Evaluation

## 2015-09-09 NOTE — Anesthesia Postprocedure Evaluation (Signed)
Anesthesia Post Note  Patient: Taylor Cannon  Procedure(s) Performed: Procedure(s) (LRB): ESOPHAGOGASTRODUODENOSCOPY (EGD) WITH PROPOFOL (N/A) ESOPHAGEAL DILATION (N/A)  Patient location during evaluation: PACU Anesthesia Type: MAC Level of consciousness: awake and patient cooperative Pain management: pain level controlled Vital Signs Assessment: post-procedure vital signs reviewed and stable Respiratory status: spontaneous breathing, nonlabored ventilation and patient connected to face mask oxygen Cardiovascular status: blood pressure returned to baseline Postop Assessment: no signs of nausea or vomiting Anesthetic complications: no    Last Vitals:  Filed Vitals:   09/09/15 0820 09/09/15 0825  BP:  162/85  Pulse:    Temp:    Resp: 16 17    Last Pain: There were no vitals filed for this visit.               Finnlee Guarnieri J

## 2015-09-12 ENCOUNTER — Telehealth (INDEPENDENT_AMBULATORY_CARE_PROVIDER_SITE_OTHER): Payer: Self-pay | Admitting: *Deleted

## 2015-09-12 DIAGNOSIS — D696 Thrombocytopenia, unspecified: Secondary | ICD-10-CM

## 2015-09-12 DIAGNOSIS — R630 Anorexia: Secondary | ICD-10-CM

## 2015-09-12 DIAGNOSIS — E871 Hypo-osmolality and hyponatremia: Secondary | ICD-10-CM

## 2015-09-12 LAB — H. PYLORI ANTIBODY, IGG: H Pylori IgG: <0.9 U/mL (ref 0.0–0.8)

## 2015-09-12 NOTE — Telephone Encounter (Signed)
Patient calls in and states Dr. Laural Golden had called her daughter and missed her about her lab results.  If someone could please call her back with them she would appreciate it.  [phone]

## 2015-09-12 NOTE — Addendum Note (Signed)
Addended by: Grayland Ormond on: 09/12/2015 02:42 PM   Modules accepted: Orders

## 2015-09-12 NOTE — Telephone Encounter (Signed)
Per Dr.Rehman the patient is to have lab work this Thursday or Friday. Patient is going to call us with the lab number in [place] to call and send request to.

## 2015-09-13 ENCOUNTER — Encounter (HOSPITAL_COMMUNITY): Payer: Self-pay | Admitting: Internal Medicine

## 2015-09-15 ENCOUNTER — Encounter (INDEPENDENT_AMBULATORY_CARE_PROVIDER_SITE_OTHER): Payer: Self-pay | Admitting: *Deleted

## 2015-09-15 ENCOUNTER — Other Ambulatory Visit (INDEPENDENT_AMBULATORY_CARE_PROVIDER_SITE_OTHER): Payer: Self-pay | Admitting: Internal Medicine

## 2015-09-15 DIAGNOSIS — K222 Esophageal obstruction: Secondary | ICD-10-CM

## 2015-09-16 ENCOUNTER — Encounter (HOSPITAL_COMMUNITY): Payer: Self-pay | Admitting: Emergency Medicine

## 2015-09-16 ENCOUNTER — Emergency Department (HOSPITAL_COMMUNITY): Payer: Medicare HMO

## 2015-09-16 ENCOUNTER — Inpatient Hospital Stay (HOSPITAL_COMMUNITY)
Admission: EM | Admit: 2015-09-16 | Discharge: 2015-09-20 | DRG: 480 | Disposition: A | Discharge status: Skilled Nursing Facility | Payer: Medicare HMO | Attending: Internal Medicine | Admitting: Internal Medicine

## 2015-09-16 DIAGNOSIS — Z9981 Dependence on supplemental oxygen: Secondary | ICD-10-CM | POA: Diagnosis not present

## 2015-09-16 DIAGNOSIS — W010XXA Fall on same level from slipping, tripping and stumbling without subsequent striking against object, initial encounter: Secondary | ICD-10-CM | POA: Diagnosis present

## 2015-09-16 DIAGNOSIS — Z720 Tobacco use: Secondary | ICD-10-CM

## 2015-09-16 DIAGNOSIS — Z681 Body mass index (BMI) 19 or less, adult: Secondary | ICD-10-CM

## 2015-09-16 DIAGNOSIS — E43 Unspecified severe protein-calorie malnutrition: Secondary | ICD-10-CM | POA: Diagnosis present

## 2015-09-16 DIAGNOSIS — S72002A Fracture of unspecified part of neck of left femur, initial encounter for closed fracture: Secondary | ICD-10-CM | POA: Diagnosis present

## 2015-09-16 DIAGNOSIS — Y92099 Unspecified place in other non-institutional residence as the place of occurrence of the external cause: Secondary | ICD-10-CM | POA: Diagnosis not present

## 2015-09-16 DIAGNOSIS — W19XXXA Unspecified fall, initial encounter: Secondary | ICD-10-CM

## 2015-09-16 DIAGNOSIS — M25552 Pain in left hip: Secondary | ICD-10-CM | POA: Diagnosis present

## 2015-09-16 DIAGNOSIS — Z87891 Personal history of nicotine dependence: Secondary | ICD-10-CM | POA: Diagnosis present

## 2015-09-16 DIAGNOSIS — Z7982 Long term (current) use of aspirin: Secondary | ICD-10-CM | POA: Diagnosis not present

## 2015-09-16 DIAGNOSIS — K219 Gastro-esophageal reflux disease without esophagitis: Secondary | ICD-10-CM | POA: Diagnosis present

## 2015-09-16 DIAGNOSIS — F17210 Nicotine dependence, cigarettes, uncomplicated: Secondary | ICD-10-CM | POA: Diagnosis present

## 2015-09-16 DIAGNOSIS — S72142A Displaced intertrochanteric fracture of left femur, initial encounter for closed fracture: Principal | ICD-10-CM | POA: Diagnosis present

## 2015-09-16 DIAGNOSIS — J449 Chronic obstructive pulmonary disease, unspecified: Secondary | ICD-10-CM | POA: Diagnosis present

## 2015-09-16 DIAGNOSIS — J439 Emphysema, unspecified: Secondary | ICD-10-CM | POA: Diagnosis not present

## 2015-09-16 DIAGNOSIS — S72143A Displaced intertrochanteric fracture of unspecified femur, initial encounter for closed fracture: Secondary | ICD-10-CM | POA: Insufficient documentation

## 2015-09-16 DIAGNOSIS — S72142D Displaced intertrochanteric fracture of left femur, subsequent encounter for closed fracture with routine healing: Secondary | ICD-10-CM | POA: Diagnosis not present

## 2015-09-16 DIAGNOSIS — Z8673 Personal history of transient ischemic attack (TIA), and cerebral infarction without residual deficits: Secondary | ICD-10-CM | POA: Diagnosis not present

## 2015-09-16 DIAGNOSIS — S72002D Fracture of unspecified part of neck of left femur, subsequent encounter for closed fracture with routine healing: Secondary | ICD-10-CM | POA: Diagnosis not present

## 2015-09-16 DIAGNOSIS — Z23 Encounter for immunization: Secondary | ICD-10-CM

## 2015-09-16 DIAGNOSIS — K222 Esophageal obstruction: Secondary | ICD-10-CM

## 2015-09-16 HISTORY — DX: Unspecified fall, initial encounter: W19.XXXA

## 2015-09-16 LAB — PROTIME-INR
INR: 1.32 (ref 0.00–1.49)
Prothrombin Time: 16.5 seconds — ABNORMAL HIGH (ref 11.6–15.2)

## 2015-09-16 LAB — URINALYSIS, ROUTINE W REFLEX MICROSCOPIC
Bilirubin Urine: NEGATIVE
Glucose, UA: NEGATIVE mg/dL
Hgb urine dipstick: NEGATIVE
Ketones, ur: NEGATIVE mg/dL
Leukocytes, UA: NEGATIVE
Nitrite: NEGATIVE
Protein, ur: NEGATIVE mg/dL
Specific Gravity, Urine: <1.005 — ABNORMAL LOW (ref 1.005–1.030)
pH: 5.5 (ref 5.0–8.0)

## 2015-09-16 LAB — BASIC METABOLIC PANEL
Anion gap: 7 (ref 5–15)
BUN: 9 mg/dL (ref 6–20)
CO2: 31 mmol/L (ref 22–32)
Calcium: 9 mg/dL (ref 8.9–10.3)
Chloride: 97 mmol/L — ABNORMAL LOW (ref 101–111)
Creatinine, Ser: 0.68 mg/dL (ref 0.44–1.00)
GFR calc Af Amer: >60 mL/min (ref >60)
GFR calc non Af Amer: >60 mL/min (ref >60)
Glucose, Bld: 102 mg/dL — ABNORMAL HIGH (ref 65–99)
Potassium: 3.7 mmol/L (ref 3.5–5.1)
Sodium: 135 mmol/L (ref 135–145)

## 2015-09-16 LAB — CBC WITH DIFFERENTIAL/PLATELET
Basophils Absolute: 0 10*3/uL (ref 0.0–0.1)
Basophils Relative: 0 %
Eosinophils Absolute: 0.1 10*3/uL (ref 0.0–0.7)
Eosinophils Relative: 1 %
HCT: 46.1 % — ABNORMAL HIGH (ref 36.0–46.0)
Hemoglobin: 14.3 g/dL (ref 12.0–15.0)
Lymphocytes Relative: 3 %
Lymphs Abs: 0.3 10*3/uL — ABNORMAL LOW (ref 0.7–4.0)
MCH: 22.6 pg — ABNORMAL LOW (ref 26.0–34.0)
MCHC: 31 g/dL (ref 30.0–36.0)
MCV: 72.9 fL — ABNORMAL LOW (ref 78.0–100.0)
Monocytes Absolute: 0.8 10*3/uL (ref 0.1–1.0)
Monocytes Relative: 8 %
Neutro Abs: 8.4 10*3/uL — ABNORMAL HIGH (ref 1.7–7.7)
Neutrophils Relative %: 88 %
Platelets: 508 10*3/uL — ABNORMAL HIGH (ref 150–400)
RBC: 6.32 MIL/uL — ABNORMAL HIGH (ref 3.87–5.11)
RDW: 18.4 % — ABNORMAL HIGH (ref 11.5–15.5)
WBC: 9.6 10*3/uL (ref 4.0–10.5)

## 2015-09-16 LAB — TYPE AND SCREEN
ABO/RH(D): O POS
Antibody Screen: NEGATIVE

## 2015-09-16 LAB — APTT: aPTT: 32 seconds (ref 24–37)

## 2015-09-16 LAB — ABO/RH: ABO/RH(D): O POS

## 2015-09-16 MED ORDER — ALBUTEROL SULFATE (2.5 MG/3ML) 0.083% IN NEBU
2.5000 mg | INHALATION_SOLUTION | Freq: Four times a day (QID) | RESPIRATORY_TRACT | Status: DC
  Administered 2015-09-16 (×2): 2.5 mg via RESPIRATORY_TRACT
  Filled 2015-09-16 (×2): qty 3

## 2015-09-16 MED ORDER — ALBUTEROL SULFATE (2.5 MG/3ML) 0.083% IN NEBU: 2.5000 mg | INHALATION_SOLUTION | Freq: Four times a day (QID) | RESPIRATORY_TRACT | Status: DC | PRN

## 2015-09-16 MED ORDER — HEPARIN SODIUM (PORCINE) 5000 UNIT/ML IJ SOLN
5000.0000 [IU] | Freq: Three times a day (TID) | INTRAMUSCULAR | Status: AC
  Administered 2015-09-16 – 2015-09-17 (×4): 5000 [IU] via SUBCUTANEOUS
  Filled 2015-09-16 (×3): qty 1

## 2015-09-16 MED ORDER — POLYETHYLENE GLYCOL 3350 17 G PO PACK: 17.0000 g | PACK | Freq: Every day | ORAL | Status: DC | PRN

## 2015-09-16 MED ORDER — INFLUENZA VAC SPLIT QUAD 0.5 ML IM SUSY
0.5000 mL | PREFILLED_SYRINGE | INTRAMUSCULAR | Status: AC
  Administered 2015-09-17: 0.5 mL via INTRAMUSCULAR
  Filled 2015-09-16: qty 0.5

## 2015-09-16 MED ORDER — HYDROCODONE-ACETAMINOPHEN 5-325 MG PO TABS
1.0000 | ORAL_TABLET | Freq: Four times a day (QID) | ORAL | Status: DC | PRN
  Administered 2015-09-16 (×2): 2 via ORAL
  Administered 2015-09-17: 1 via ORAL
  Administered 2015-09-17 (×2): 2 via ORAL
  Administered 2015-09-18: 1 via ORAL
  Administered 2015-09-18: 2 via ORAL
  Administered 2015-09-18: 1 via ORAL
  Filled 2015-09-16 (×6): qty 2
  Filled 2015-09-16 (×2): qty 1
  Filled 2015-09-16: qty 2

## 2015-09-16 MED ORDER — HYDROMORPHONE HCL 1 MG/ML IJ SOLN
INTRAMUSCULAR | Status: AC
  Filled 2015-09-16: qty 1

## 2015-09-16 MED ORDER — MORPHINE SULFATE (PF) 2 MG/ML IV SOLN
2.0000 mg | INTRAVENOUS | Status: DC | PRN
  Administered 2015-09-16 – 2015-09-18 (×3): 2 mg via INTRAVENOUS
  Filled 2015-09-16 (×3): qty 1

## 2015-09-16 MED ORDER — ALPRAZOLAM 0.25 MG PO TABS
0.2500 mg | ORAL_TABLET | Freq: Two times a day (BID) | ORAL | Status: DC | PRN
  Administered 2015-09-16 – 2015-09-18 (×5): 0.25 mg via ORAL
  Filled 2015-09-16 (×6): qty 1

## 2015-09-16 MED ORDER — ONDANSETRON HCL 4 MG/2ML IJ SOLN
4.0000 mg | Freq: Once | INTRAMUSCULAR | Status: AC
  Administered 2015-09-16: 4 mg via INTRAVENOUS
  Filled 2015-09-16: qty 2

## 2015-09-16 MED ORDER — HYDROMORPHONE HCL 1 MG/ML IJ SOLN
0.5000 mg | INTRAMUSCULAR | Status: DC | PRN
  Administered 2015-09-16: 0.5 mg via INTRAVENOUS

## 2015-09-16 MED ORDER — SODIUM CHLORIDE 0.9 % IV SOLN
INTRAVENOUS | Status: AC
  Administered 2015-09-16: 16:00:00 via INTRAVENOUS

## 2015-09-16 MED ORDER — ASPIRIN 81 MG PO CHEW
81.0000 mg | CHEWABLE_TABLET | Freq: Every day | ORAL | Status: DC
  Administered 2015-09-16: 81 mg via ORAL
  Filled 2015-09-16 (×2): qty 1

## 2015-09-16 MED ORDER — ACETAMINOPHEN 325 MG PO TABS
650.0000 mg | ORAL_TABLET | Freq: Four times a day (QID) | ORAL | Status: DC | PRN
  Administered 2015-09-17 – 2015-09-18 (×2): 650 mg via ORAL
  Filled 2015-09-16 (×2): qty 2

## 2015-09-16 MED ORDER — FENTANYL CITRATE (PF) 100 MCG/2ML IJ SOLN
50.0000 ug | INTRAMUSCULAR | Status: AC | PRN
  Administered 2015-09-16 (×2): 50 ug via INTRAVENOUS
  Filled 2015-09-16 (×2): qty 2

## 2015-09-16 MED ORDER — PANTOPRAZOLE SODIUM 40 MG PO TBEC
40.0000 mg | DELAYED_RELEASE_TABLET | Freq: Every day | ORAL | Status: DC
  Administered 2015-09-16 – 2015-09-20 (×4): 40 mg via ORAL
  Filled 2015-09-16 (×4): qty 1

## 2015-09-16 MED ORDER — PROMETHAZINE HCL 25 MG/ML IJ SOLN
6.2500 mg | Freq: Four times a day (QID) | INTRAMUSCULAR | Status: DC | PRN
  Administered 2015-09-16 – 2015-09-19 (×6): 6.25 mg via INTRAVENOUS
  Filled 2015-09-16 (×6): qty 1

## 2015-09-16 MED ORDER — NICOTINE 21 MG/24HR TD PT24
21.0000 mg | MEDICATED_PATCH | Freq: Every day | TRANSDERMAL | Status: DC
  Administered 2015-09-16 – 2015-09-20 (×4): 21 mg via TRANSDERMAL
  Filled 2015-09-16 (×5): qty 1

## 2015-09-16 MED ORDER — MORPHINE SULFATE (PF) 2 MG/ML IV SOLN
0.5000 mg | INTRAVENOUS | Status: DC | PRN
  Administered 2015-09-16: 0.5 mg via INTRAVENOUS
  Filled 2015-09-16: qty 1

## 2015-09-16 MED ORDER — HEPARIN SODIUM (PORCINE) 5000 UNIT/ML IJ SOLN: 5000.0000 [IU] | Freq: Three times a day (TID) | INTRAMUSCULAR | Status: DC

## 2015-09-16 MED ORDER — ONDANSETRON HCL 4 MG/2ML IJ SOLN
4.0000 mg | Freq: Three times a day (TID) | INTRAMUSCULAR | Status: AC | PRN
  Administered 2015-09-16: 4 mg via INTRAVENOUS
  Filled 2015-09-16: qty 2

## 2015-09-16 MED ORDER — IPRATROPIUM-ALBUTEROL 0.5-2.5 (3) MG/3ML IN SOLN
3.0000 mL | Freq: Four times a day (QID) | RESPIRATORY_TRACT | Status: DC
  Filled 2015-09-16: qty 3

## 2015-09-16 NOTE — Progress Notes (Signed)
Initial Nutrition Assessment  DOCUMENTATION CODES:   Severe malnutrition in context of chronic illness, Underweight  INTERVENTION:  Provide Oral nutrition supplement when pt is able to advance diet to full liquids -Ensure Enlive po BID, each supplement provides 350 kcal and 20 grams of protein  - MVI daily -RD will continue to follow for nutrition care   NUTRITION DIAGNOSIS:   Malnutrition related to poor appetite, social / environmental circumstances, dysphagia as evidenced by severe depletion of body fat, severe depletion of muscle mass.  GOAL:   Patient will meet greater than or equal to 90% of their needs, Weight gain  MONITOR:   Diet advancement, PO intake, Supplement acceptance, Weight trends, Labs  REASON FOR ASSESSMENT:   Consult Assessment of nutrition requirement/status  ASSESSMENT:  Pt is pleasant 82 yo female with hx of COPD, GERD, multiple esophageal dilations due to stricture,  ETOH abuse, Smoker (55 years). Pt says she lost weight 2-3 years ago and has not been able to regain it. She has taken Remeron but says "I might as well drink water".  Her weight hx shows 107-112# for the past 2+ years. Pt tells me that she eats one meal daily. Drinks coffee in the morning , snacks on string cheese and then eats small meal in the evening. Pt cooks for herself and likes fried potatoes, fatback meat, gravy/biscuits and eggs. The quality of  Her diet very poor -lacks fruits, vegetable whole grains, adequate protein and so on.  She is NPO currently waiting for ortho to see her. Pt daughter is also here and affirms her mom's poor eating habits and minimal intake. She doesn't take vitamins.  Pt meets criteria for severe MALNUTRITION in the context of chronic illness as evidenced by severe muscle wasting, energy intake <75% for >/= 1 month.   Diet Order:  Diet Heart Room service appropriate?: Yes; Fluid consistency:: Thin Diet NPO time specified  Skin:   intact  Last BM:    prior to admission   Height:   Ht Readings from Last 1 Encounters:  09/16/15 5\' 8"  (1.727 m)    Weight:   Wt Readings from Last 1 Encounters:  09/16/15 107 lb (48.535 kg)    Ideal Body Weight:  63.6 kg  BMI:  Body mass index is 16.27 kg/(m^2).  Estimated Nutritional Needs:   Kcal:  [id number]   Protein:  59-73 gr  Fluid:  1.5 liters daily  EDUCATION NEEDS:   No education needs identified at this time   Colman Cater MS,RD,CSG,LDN Office: [phone] Pager: [phone]

## 2015-09-16 NOTE — ED Provider Notes (Signed)
CSN: [id number]     Arrival date & time 09/16/15  0910 History  By signing my name below, I, Jolayne Panther, attest that this documentation has been prepared under the direction and in the presence of Noemi Chapel, MD. Electronically Signed: Jolayne Panther, Scribe. 09/16/2015. 9:26 AM.   Chief Complaint  Patient presents with  . Fall   The history is provided by the patient. No language interpreter was used.    HPI Comments: Taylor Cannon is a 82 y.o. female who presents to the Emergency Department complaining of left hip pain following a mechanical fall where the pt caught her foot and fell onto her left hip earlier this morning. Pt notes that she hit her head when she fell but denies LOC. She also reports a small tear of her skin on the left forearm but states that her upper extremities are fine otherwise. After her fall she was unable to ambulate but was able to drag herself to the phone to call for help.  Pt takes 81 mg aspirin daily. Pt denies recent nausea, fever, and diarrhea. She also denies hx of broken bones and cancer. She does not take an heart medications. NKDA.   Occurred just pta - sx are constant, moderate - unable to walk on leg - no numbness  Past Medical History  Diagnosis Date  . GERD (gastroesophageal reflux disease)   . Depression   . COPD (chronic obstructive pulmonary disease) ([place])   . On home oxygen therapy     2L via [place] at night  . TIA (transient ischemic attack)   . Anxiety    Past Surgical History  Procedure Laterality Date  . Appendectomy    . Cervical cone biopsy    . Tubal ligation    . Cardiac electrophysiology study and ablation    . Cataract extraction Bilateral   . Esophagogastroduodenoscopy (egd) with propofol N/A 07/24/2013    Procedure: ESOPHAGOGASTRODUODENOSCOPY (EGD) WITH PROPOFOL UNDER FLUOROSCOPY;  Surgeon: Rogene Houston, MD;  Location: [facility];  Service: Endoscopy;  Laterality: N/A;  . Balloon dilation N/A 07/24/2013   Procedure: BALLOON DILATION to 13.46mm;  Surgeon: Rogene Houston, MD;  Location: [facility];  Service: Endoscopy;  Laterality: N/A;  . Esophagogastroduodenoscopy (egd) with esophageal dilation N/A 08/20/2013    Procedure: ESOPHAGOGASTRODUODENOSCOPY (EGD) WITH ESOPHAGEAL DILATION;  Surgeon: Rogene Houston, MD;  [facility];  Service: Endoscopy;  Laterality: N/A;  345-moved to 730 Ann notified pt  . Esophagogastroduodenoscopy N/A 11/11/2014    Procedure: ESOPHAGOGASTRODUODENOSCOPY (EGD);  Surgeon: Rogene Houston, MD;  [facility];  Service: Endoscopy;  Laterality: N/A;  125-rescheduled 11/11/14 @ 10:30am Ann notified pt  . Balloon dilation N/A 11/11/2014    Procedure: BALLOON DILATION;  Surgeon: Rogene Houston, MD;  [facility];  Service: Endoscopy;  Laterality: N/A;  . Esophagogastroduodenoscopy (egd) with propofol N/A 11/30/2014    Procedure: ESOPHAGOGASTRODUODENOSCOPY (EGD) WITH PROPOFOL;  Surgeon: Rogene Houston, MD;  [facility]: [facility];  Service: Endoscopy;  Laterality: N/A;  fluoro not needed  . Esophageal dilation N/A 11/30/2014    Procedure: ESOPHAGEAL DILATION;  Surgeon: Rogene Houston, MD;  [facility];  Service: Endoscopy;  Laterality: N/A;  Balloon, 15, 16.5  . Esophagogastroduodenoscopy (egd) with propofol N/A 06/03/2015    Procedure: ESOPHAGOGASTRODUODENOSCOPY (EGD) WITH PROPOFOL; HIATUS AT 35 CM AND GE JUNCTION 40 CM;  Surgeon: Rogene Houston, MD;  [facility]: [facility];  Service: Endoscopy;  Laterality: N/A;  .  Esophageal dilation N/A 06/03/2015    Procedure: ESOPHAGEAL DILATION WITH 16.5 MM BALLOON ;  Surgeon: Rogene Houston, MD;  [facility];  Service: Endoscopy;  Laterality: N/A;  . Esophagogastroduodenoscopy (egd) with propofol N/A 09/09/2015    Procedure: ESOPHAGOGASTRODUODENOSCOPY (EGD) WITH PROPOFOL;  Surgeon: Rogene Houston, MD;  [facility];  Service: Endoscopy;  Laterality: N/A;  8:15  . Esophageal dilation N/A 09/09/2015     Procedure: ESOPHAGEAL DILATION;  Surgeon: Rogene Houston, MD;  [facility];  Service: Endoscopy;  Laterality: N/A;   No family history on file. Social History  Substance Use Topics  . Smoking status: Current Every Day Smoker -- 1.00 packs/day for 55 years    Types: Cigarettes  . Smokeless tobacco: None     Comment: 1 pack a day x since early twenties.  . Alcohol Use: 8.4 oz/week    14 Shots of liquor per week     Comment: 2 drinks nightly    OB History    No data available     Review of Systems  Constitutional: Negative for fever.  Gastrointestinal: Positive for nausea and diarrhea.  Musculoskeletal: Positive for myalgias, arthralgias and gait problem.       Left hip  Skin: Positive for wound.  Neurological: Negative for syncope.  All other systems reviewed and are negative.  Allergies  Other  Home Medications   Prior to Admission medications   Medication Sig Start Date End Date Taking? Authorizing Provider  ALPRAZolam (XANAX) 0.25 MG tablet Take 0.25 mg by mouth 2 (two) times daily as needed for anxiety.   Yes Historical Provider, MD  aspirin 81 MG tablet Take 81 mg by mouth daily.   Yes Historical Provider, MD  calcium-vitamin D (OSCAL WITH D) 500-200 MG-UNIT per tablet Take 1 tablet by mouth daily with breakfast.   Yes Historical Provider, MD  Multiple Vitamins-Minerals (MULTIVITAMINS THER. W/MINERALS) TABS tablet Take 1 tablet by mouth daily.   Yes Historical Provider, MD  omeprazole (PRILOSEC) 20 MG capsule Take 20 mg by mouth daily.   Yes Historical Provider, MD  vitamin C (ASCORBIC ACID) 500 MG tablet Take 500 mg by mouth daily.   Yes Historical Provider, MD   BP 99/77 mmHg  Pulse 104  Temp(Src) 98.2 F (36.8 C)  Resp 12  Ht 5\' 8"  (1.727 m)  Wt 107 lb (48.535 kg)  BMI 16.27 kg/m2  SpO2 100% Physical Exam  Constitutional: She appears well-developed and well-nourished. No distress.  HENT:  Head: Normocephalic.  Mouth/Throat: Oropharynx is clear  and moist. No oropharyngeal exudate.  3 cm hematoma temporal left  No malocclusion No hemotympanum No racoon eyes No battle's sign  Eyes: Conjunctivae and EOM are normal. Pupils are equal, round, and reactive to light. Right eye exhibits no discharge. Left eye exhibits no discharge. No scleral icterus.  Neck: Normal range of motion. Neck supple. No JVD present. No thyromegaly present.  Cardiovascular: Normal rate, regular rhythm, normal heart sounds and intact distal pulses.  Exam reveals no gallop and no friction rub.   No murmur heard. Tachycardia  Normal pulses of the feet   Pulmonary/Chest: Effort normal and breath sounds normal. No respiratory distress. She has no wheezes. She has no rales.  Abdominal: Soft. Bowel sounds are normal. She exhibits no distension and no mass. There is no tenderness.  Musculoskeletal: Normal range of motion. She exhibits no edema or tenderness.  Tenderness, deformity, decreased ROM left hip Otherwise all joints normal  LLE is shortened and externally rotated  Lymphadenopathy:    She has no cervical adenopathy.  Neurological: She is alert. Coordination normal.  Able to follow all commands - normal speech, memory and coordination - can't lift LLE 2/2 pain  Skin: Skin is warm and dry. No rash noted. No erythema.  4 cm superficial skin tear to the left forearm, no bleeding  Senile purpura   Psychiatric: She has a normal mood and affect. Her behavior is normal.  Nursing note and vitals reviewed.   ED Course  Procedures  DIAGNOSTIC STUDIES:    Oxygen Saturation is 93% on RA, low by my interpretation.   COORDINATION OF CARE:  9:17 AM Will order x ray of pt's hip and chest. Discussed treatment plan with pt at bedside and pt agreed to plan.   Labs Review Labs Reviewed  BASIC METABOLIC PANEL - Abnormal; Notable for the following:    Chloride 97 (*)    Glucose, Bld 102 (*)    All other components within normal limits  CBC WITH DIFFERENTIAL/PLATELET  - Abnormal; Notable for the following:    RBC 6.32 (*)    HCT 46.1 (*)    MCV 72.9 (*)    MCH 22.6 (*)    RDW 18.4 (*)    Platelets 508 (*)    Neutro Abs 8.4 (*)    Lymphs Abs 0.3 (*)    All other components within normal limits  PROTIME-INR - Abnormal; Notable for the following:    Prothrombin Time 16.5 (*)    All other components within normal limits  URINALYSIS, ROUTINE W REFLEX MICROSCOPIC (NOT AT [facility]) - Abnormal; Notable for the following:    Specific Gravity, Urine <1.005 (*)    All other components within normal limits  APTT  TYPE AND SCREEN  ABO/RH    Imaging Review Dg Chest 1 View  09/16/2015  CLINICAL DATA:  [place].  Left hip fracture. EXAM: CHEST 1 VIEW COMPARISON:  None available. FINDINGS: The cardiac silhouette, mediastinal and hilar contours are within normal limits. There is tortuosity, ectasia and calcification of the thoracic aorta. Suspect a moderate-sized hiatal hernia. There are emphysematous changes and pulmonary scarring but no acute overlying pulmonary process. The bony thorax is intact. IMPRESSION: Emphysematous changes and pulmonary scarring but no acute overlying pulmonary process. Electronically Signed   By: Marijo Sanes M.D.   On: 09/16/2015 10:26   Ct Head Wo Contrast  09/16/2015  CLINICAL DATA:  Mechanical fall. Patient caught her foot and fell to her left hip earlier this morning. Patient hit her head when she fell. No loss of consciousness. History of TIA. EXAM: CT HEAD WITHOUT CONTRAST TECHNIQUE: Contiguous axial images were obtained from the base of the skull through the vertex without intravenous contrast. COMPARISON:  None. FINDINGS: There is central and cortical atrophy. Periventricular white matter changes are consistent with small vessel disease. There is no intra or extra-axial fluid collection or mass lesion. The basilar cisterns and ventricles have a normal appearance. There is no CT evidence for acute infarction or hemorrhage. Bone windows are  unremarkable. IMPRESSION: 1. Atrophy and small vessel disease. 2.  No evidence for acute intracranial abnormality. Electronically Signed   By: Nolon Nations M.D.   On: 09/16/2015 10:43   Dg Hip Unilat With Pelvis 2-3 Views Left  09/16/2015  CLINICAL DATA:  [place].  Injured left hip. EXAM: DG HIP (WITH OR WITHOUT PELVIS) 2-3V LEFT COMPARISON:  None. FINDINGS: There is a displaced intertrochanteric fracture of the  left hip. The right hip is normal. The pubic symphysis and SI joints are intact. No pelvic fractures. IMPRESSION: Displaced intertrochanteric fracture of the left hip Electronically Signed   By: Marijo Sanes M.D.   On: 09/16/2015 10:26   I have personally reviewed and evaluated these images and lab results as part of my medical decision-making.   EKG Interpretation   Date/Time:  Friday September 16 2015 09:42:59 EST Ventricular Rate:  107 PR Interval:  165 QRS Duration: 96 QT Interval:  339 QTC Calculation: 452 R Axis:   -79 Text Interpretation:  Sinus tachycardia Left axis deviation Since last  tracing rate faster Confirmed by Warnie Belair  MD, Arieliz Latino ([id number]) on 09/16/2015  10:34:28 AM      MDM   Final diagnoses:  Hip fracture, left, closed, initial encounter ([place])    The pt has obvious hip fracture Order set used Pain meds, imaging, pre op labs NPO  Pt aware of plan and ina greement Despite having prior ICH after a fall with head injury - has minimal sx - CT ordered to r/o recurrent ICH  D/w Dr. Aline Brochure - will see for orthopedic consultation for repair D/w Dr. Roderic Palau who will admit - h olding orders requested  Meds given in ED:  Medications  HYDROmorphone (DILAUDID) injection 0.5 mg (0.5 mg Intravenous Given 09/16/15 1132)  fentaNYL (SUBLIMAZE) injection 50 mcg (50 mcg Intravenous Given 09/16/15 1038)  ondansetron (ZOFRAN) injection 4 mg (4 mg Intravenous Given 09/16/15 0950)    New Prescriptions   No medications on file      I personally performed the services  described in this documentation, which was scribed in my presence. The recorded information has been reviewed and is accurate.       Noemi Chapel, MD 09/16/15 1139

## 2015-09-16 NOTE — Consult Note (Signed)
Reason for Consult: hip fracture  Referring Physician: Mitzie Na Perris is an 82 y.o. female.  HPI: 82 yo mechanical fall injured left hip  Pain left hip Severe 2 days duration Timing constant Worse when moving   Past Medical History  Diagnosis Date  . GERD (gastroesophageal reflux disease)   . Depression   . COPD (chronic obstructive pulmonary disease) ([place])   . On home oxygen therapy     2L via [place] at night  . TIA (transient ischemic attack)   . Anxiety     Past Surgical History  Procedure Laterality Date  . Appendectomy    . Cervical cone biopsy    . Tubal ligation    . Cardiac electrophysiology study and ablation    . Cataract extraction Bilateral   . Esophagogastroduodenoscopy (egd) with propofol N/A 07/24/2013    Procedure: ESOPHAGOGASTRODUODENOSCOPY (EGD) WITH PROPOFOL UNDER FLUOROSCOPY;  Surgeon: Rogene Houston, MD;  [facility];  Service: Endoscopy;  Laterality: N/A;  . Balloon dilation N/A 07/24/2013    Procedure: BALLOON DILATION to 13.65m;  Surgeon: NRogene Houston MD;  [facility];  Service: Endoscopy;  Laterality: N/A;  . Esophagogastroduodenoscopy (egd) with esophageal dilation N/A 08/20/2013    Procedure: ESOPHAGOGASTRODUODENOSCOPY (EGD) WITH ESOPHAGEAL DILATION;  Surgeon: NRogene Houston MD;  [facility];  Service: Endoscopy;  Laterality: N/A;  345-moved to 730 Ann notified pt  . Esophagogastroduodenoscopy N/A 11/11/2014    Procedure: ESOPHAGOGASTRODUODENOSCOPY (EGD);  Surgeon: NRogene Houston MD;  [facility];  Service: Endoscopy;  Laterality: N/A;  125-rescheduled 11/11/14 @ 10:30am Ann notified pt  . Balloon dilation N/A 11/11/2014    Procedure: BALLOON DILATION;  Surgeon: NRogene Houston MD;  [facility];  Service: Endoscopy;  Laterality: N/A;  . Esophagogastroduodenoscopy (egd) with propofol N/A 11/30/2014    Procedure: ESOPHAGOGASTRODUODENOSCOPY (EGD) WITH PROPOFOL;  Surgeon: NRogene Houston MD;   [facility];  Service: Endoscopy;  Laterality: N/A;  fluoro not needed  . Esophageal dilation N/A 11/30/2014    Procedure: ESOPHAGEAL DILATION;  Surgeon: NRogene Houston MD;  [facility];  Service: Endoscopy;  Laterality: N/A;  Balloon, 15, 16.5  . Esophagogastroduodenoscopy (egd) with propofol N/A 06/03/2015    Procedure: ESOPHAGOGASTRODUODENOSCOPY (EGD) WITH PROPOFOL; HIATUS AT 35 CM AND GE JUNCTION 40 CM;  Surgeon: NRogene Houston MD;  [facility];  Service: Endoscopy;  Laterality: N/A;  . Esophageal dilation N/A 06/03/2015    Procedure: ESOPHAGEAL DILATION WITH 16.5 MM BALLOON ;  Surgeon: NRogene Houston MD;  [facility]: [facility];  Service: Endoscopy;  Laterality: N/A;  . Esophagogastroduodenoscopy (egd) with propofol N/A 09/09/2015    Procedure: ESOPHAGOGASTRODUODENOSCOPY (EGD) WITH PROPOFOL;  Surgeon: NRogene Houston MD;  [facility];  Service: Endoscopy;  Laterality: N/A;  8:15  . Esophageal dilation N/A 09/09/2015    Procedure: ESOPHAGEAL DILATION;  Surgeon: NRogene Houston MD;  [facility];  Service: Endoscopy;  Laterality: N/A;    History reviewed. No pertinent family history.  Social History:  reports that she has been smoking Cigarettes.  She has a 55 pack-year smoking history. She does not have any smokeless tobacco history on file. She reports that she drinks about 8.4 oz of alcohol per week. She reports that she does not use illicit drugs.  Allergies:  Allergies  Allergen Reactions  . Other Nausea Only    Steroids    Medications: I have reviewed the patient's current  medications.  Results for orders placed or performed during the hospital encounter of 09/16/15 (from the past 48 hour(s))  Urinalysis, Routine w reflex microscopic (not at [facility])     Status: Abnormal   Collection Time: 09/16/15  9:30 AM  Result Value Ref Range   Color, Urine YELLOW YELLOW   APPearance CLEAR CLEAR   Specific Gravity, Urine <1.005 (L) 1.005 - 1.030   pH 5.5  5.0 - 8.0   Glucose, UA NEGATIVE NEGATIVE mg/dL   Hgb urine dipstick NEGATIVE NEGATIVE   Bilirubin Urine NEGATIVE NEGATIVE   Ketones, ur NEGATIVE NEGATIVE mg/dL   Protein, ur NEGATIVE NEGATIVE mg/dL   Nitrite NEGATIVE NEGATIVE   Leukocytes, UA NEGATIVE NEGATIVE    Comment: MICROSCOPIC NOT DONE ON URINES WITH NEGATIVE PROTEIN, BLOOD, LEUKOCYTES, NITRITE, OR GLUCOSE <1000 mg/dL.  ABO/Rh     Status: None   Collection Time: 09/16/15  9:30 AM  Result Value Ref Range   ABO/RH(D) O POS   Basic metabolic panel     Status: Abnormal   Collection Time: 09/16/15  9:43 AM  Result Value Ref Range   Sodium 135 135 - 145 mmol/L   Potassium 3.7 3.5 - 5.1 mmol/L   Chloride 97 (L) 101 - 111 mmol/L   CO2 31 22 - 32 mmol/L   Glucose, Bld 102 (H) 65 - 99 mg/dL   BUN 9 6 - 20 mg/dL   Creatinine, Ser 0.68 0.44 - 1.00 mg/dL   Calcium 9.0 8.9 - 10.3 mg/dL   GFR calc non Af Amer >60 >60 mL/min   GFR calc Af Amer >60 >60 mL/min    Comment: (NOTE) The eGFR has been calculated using the CKD EPI equation. This calculation has not been validated in all clinical situations. eGFR's persistently <60 mL/min signify possible Chronic Kidney Disease.    Anion gap 7 5 - 15  CBC WITH DIFFERENTIAL     Status: Abnormal   Collection Time: 09/16/15  9:43 AM  Result Value Ref Range   WBC 9.6 4.0 - 10.5 K/uL   RBC 6.32 (H) 3.87 - 5.11 MIL/uL   Hemoglobin 14.3 12.0 - 15.0 g/dL   HCT 46.1 (H) 36.0 - 46.0 %   MCV 72.9 (L) 78.0 - 100.0 fL   MCH 22.6 (L) 26.0 - 34.0 pg   MCHC 31.0 30.0 - 36.0 g/dL   RDW 18.4 (H) 11.5 - 15.5 %   Platelets 508 (H) 150 - 400 K/uL   Neutrophils Relative % 88 %   Neutro Abs 8.4 (H) 1.7 - 7.7 K/uL   Lymphocytes Relative 3 %   Lymphs Abs 0.3 (L) 0.7 - 4.0 K/uL   Monocytes Relative 8 %   Monocytes Absolute 0.8 0.1 - 1.0 K/uL   Eosinophils Relative 1 %   Eosinophils Absolute 0.1 0.0 - 0.7 K/uL   Basophils Relative 0 %   Basophils Absolute 0.0 0.0 - 0.1 K/uL  Protime-INR     Status:  Abnormal   Collection Time: 09/16/15  9:43 AM  Result Value Ref Range   Prothrombin Time 16.5 (H) 11.6 - 15.2 seconds   INR 1.32 0.00 - 1.49  APTT     Status: None   Collection Time: 09/16/15  9:43 AM  Result Value Ref Range   aPTT 32 24 - 37 seconds  Type and screen [facility]     Status: None   Collection Time: 09/16/15 10:06 AM  Result Value Ref Range   ABO/RH(D) O POS  Antibody Screen NEG    Sample Expiration 09/19/2015     Dg Chest 1 View  09/16/2015  CLINICAL DATA:  [place].  Left hip fracture. EXAM: CHEST 1 VIEW COMPARISON:  None available. FINDINGS: The cardiac silhouette, mediastinal and hilar contours are within normal limits. There is tortuosity, ectasia and calcification of the thoracic aorta. Suspect a moderate-sized hiatal hernia. There are emphysematous changes and pulmonary scarring but no acute overlying pulmonary process. The bony thorax is intact. IMPRESSION: Emphysematous changes and pulmonary scarring but no acute overlying pulmonary process. Electronically Signed   By: Marijo Sanes M.D.   On: 09/16/2015 10:26   Ct Head Wo Contrast  09/16/2015  CLINICAL DATA:  Mechanical fall. Patient caught her foot and fell to her left hip earlier this morning. Patient hit her head when she fell. No loss of consciousness. History of TIA. EXAM: CT HEAD WITHOUT CONTRAST TECHNIQUE: Contiguous axial images were obtained from the base of the skull through the vertex without intravenous contrast. COMPARISON:  None. FINDINGS: There is central and cortical atrophy. Periventricular white matter changes are consistent with small vessel disease. There is no intra or extra-axial fluid collection or mass lesion. The basilar cisterns and ventricles have a normal appearance. There is no CT evidence for acute infarction or hemorrhage. Bone windows are unremarkable. IMPRESSION: 1. Atrophy and small vessel disease. 2.  No evidence for acute intracranial abnormality. Electronically Signed   By:  Nolon Nations M.D.   On: 09/16/2015 10:43   Dg Hip Unilat With Pelvis 2-3 Views Left  09/16/2015  CLINICAL DATA:  [place].  Injured left hip. EXAM: DG HIP (WITH OR WITHOUT PELVIS) 2-3V LEFT COMPARISON:  None. FINDINGS: There is a displaced intertrochanteric fracture of the left hip. The right hip is normal. The pubic symphysis and SI joints are intact. No pelvic fractures. IMPRESSION: Displaced intertrochanteric fracture of the left hip Electronically Signed   By: Marijo Sanes M.D.   On: 09/16/2015 10:26    Review of Systems  Constitutional: Negative for fever.  Respiratory: Negative for cough.   Cardiovascular: Negative for chest pain.  Gastrointestinal: Negative for heartburn.  Genitourinary: Negative for dysuria.  Musculoskeletal: Positive for joint pain and falls.  Neurological: Negative for dizziness.  Endo/Heme/Allergies: Does not bruise/bleed easily.  Psychiatric/Behavioral: Negative for depression.  All other systems reviewed and are negative.  Blood pressure 144/68, pulse 106, temperature 98.1 F (36.7 C), temperature source Oral, resp. rate 18, height _0  (1.727 m), weight 107 lb (48.535 kg), SpO2 89 %. Physical Exam  Constitutional: She is oriented to person, place, and time. She appears well-developed and well-nourished. No distress.  HENT:  Head: Normocephalic and atraumatic.  Eyes: Pupils are equal, round, and reactive to light.  Neck: Normal range of motion. No JVD present.  Cardiovascular: Normal rate and intact distal pulses.   Respiratory: Effort normal.  GI: Soft. She exhibits no distension.  Musculoskeletal:  Upper extremities and right lower extrem normal alignment rom strength and stability   Left lower extremity Exam limited by fracture External rotation and short Muscle tone normal  Knee ankle stable Hip rom could not assess 2nd to pain   Lymphadenopathy:    She has no cervical adenopathy.  Neurological: She is alert and oriented to person, place,  and time. She displays normal reflexes. No cranial nerve deficit. She exhibits normal muscle tone. Coordination normal.  Skin: Skin is warm and dry. She is not diaphoretic.  Psychiatric: She has a normal mood and affect. Her  behavior is normal. Judgment and thought content normal.    Assessment/Plan: i interpret xray as 3 part intertroch with intact posteromedial buttress  i rec internal fix with gamma nail left hip   risk and benefits explained   Arther Abbott 09/16/2015, 4:49 PM

## 2015-09-16 NOTE — ED Notes (Signed)
Report given to floor. Ready for patient.

## 2015-09-16 NOTE — H&P (Signed)
[facility] History and Physical  AMERICUS HERSON [id number] DOB: [birth year] DOA: 09/16/2015  Referring physician: Noemi Chapel, MD. PCP: Elyn Aquas   Chief Complaint: Fall  HPI: Laynah Cliver Rail is a 82 y.o. female with a hx of COPD, GERD, and a TIA that presented to the ED s/p a mechanical fall that occurred this morning. Patient states her foot got caught in her oxygen tubing and she fell onto her left side. She admits to hitting her head but denies any LOC. After her fall, she was unable to ambulate but managed to drag herself to a phone to call for help. She currently complains of pain in her left hip that is worse with movement. She denies any fever, chills, CP, SOB, visual changes, dizziness, numbness, or tinging. Patient typically ambulates without assistance but has been unable to ambulate since her fall. She wears her O2 QHS.   While in the ED, vital signs were stable. Labs were relatively unremarkable. Head CT and CXR were negative for any acute process. Xray of the hip revealed a displaced intertrochanteric fracture of the left hip. She will be admitted for further management.     Review of Systems:  Constitutional:  No weight loss, night sweats, Fevers, chills, fatigue.  HEENT:  No headaches, Difficulty swallowing,Tooth/dental problems,Sore throat,  No sneezing, itching, ear ache, nasal congestion, post nasal drip,  Cardio-vascular:  No chest pain, Orthopnea, PND, swelling in lower extremities, anasarca, dizziness, palpitations  GI:  No heartburn, indigestion, abdominal pain, nausea, vomiting, diarrhea, change in bowel habits, loss of appetite  Resp:  No shortness of breath with exertion or at rest. No excess mucus, no productive cough, No non-productive cough, No coughing up of blood.No change in color of mucus.No wheezing.No chest wall deformity  Skin:  no rash or lesions.  GU:  no dysuria, change in color of urine, no urgency or frequency. No flank pain.    Musculoskeletal:  Left hip pain, decreased ROM secondary to pain  No swelling.  No back pain.  Psych:  No change in mood or affect. No depression or anxiety. No memory loss.   Past Medical History  Diagnosis Date  . GERD (gastroesophageal reflux disease)   . Depression   . COPD (chronic obstructive pulmonary disease) ([place])   . On home oxygen therapy     2L via [place] at night  . TIA (transient ischemic attack)   . Anxiety    Past Surgical History  Procedure Laterality Date  . Appendectomy    . Cervical cone biopsy    . Tubal ligation    . Cardiac electrophysiology study and ablation    . Cataract extraction Bilateral   . Esophagogastroduodenoscopy (egd) with propofol N/A 07/24/2013    Procedure: ESOPHAGOGASTRODUODENOSCOPY (EGD) WITH PROPOFOL UNDER FLUOROSCOPY;  Surgeon: Rogene Houston, MD;  Location: [facility];  Service: Endoscopy;  Laterality: N/A;  . Balloon dilation N/A 07/24/2013    Procedure: BALLOON DILATION to 13.41mm;  Surgeon: Rogene Houston, MD;  Location: [facility];  Service: Endoscopy;  Laterality: N/A;  . Esophagogastroduodenoscopy (egd) with esophageal dilation N/A 08/20/2013    Procedure: ESOPHAGOGASTRODUODENOSCOPY (EGD) WITH ESOPHAGEAL DILATION;  Surgeon: Rogene Houston, MD;  Location: [facility];  Service: Endoscopy;  Laterality: N/A;  345-moved to 730 Ann notified pt  . Esophagogastroduodenoscopy N/A 11/11/2014    Procedure: ESOPHAGOGASTRODUODENOSCOPY (EGD);  Surgeon: Rogene Houston, MD;  [facility]: [facility];  Service: Endoscopy;  Laterality: N/A;  125-rescheduled 11/11/14 @ 10:30am Ann notified  pt  . Balloon dilation N/A 11/11/2014    Procedure: BALLOON DILATION;  Surgeon: Rogene Houston, MD;  [facility];  Service: Endoscopy;  Laterality: N/A;  . Esophagogastroduodenoscopy (egd) with propofol N/A 11/30/2014    Procedure: ESOPHAGOGASTRODUODENOSCOPY (EGD) WITH PROPOFOL;  Surgeon: Rogene Houston, MD;  Location: [facility];  Service: Endoscopy;  Laterality:  N/A;  fluoro not needed  . Esophageal dilation N/A 11/30/2014    Procedure: ESOPHAGEAL DILATION;  Surgeon: Rogene Houston, MD;  Location: [facility];  Service: Endoscopy;  Laterality: N/A;  Balloon, 15, 16.5  . Esophagogastroduodenoscopy (egd) with propofol N/A 06/03/2015    Procedure: ESOPHAGOGASTRODUODENOSCOPY (EGD) WITH PROPOFOL; HIATUS AT 35 CM AND GE JUNCTION 40 CM;  Surgeon: Rogene Houston, MD;  Location: [facility];  Service: Endoscopy;  Laterality: N/A;  . Esophageal dilation N/A 06/03/2015    Procedure: ESOPHAGEAL DILATION WITH 16.5 MM BALLOON ;  Surgeon: Rogene Houston, MD;  Location: [facility];  Service: Endoscopy;  Laterality: N/A;  . Esophagogastroduodenoscopy (egd) with propofol N/A 09/09/2015    Procedure: ESOPHAGOGASTRODUODENOSCOPY (EGD) WITH PROPOFOL;  Surgeon: Rogene Houston, MD;  [facility];  Service: Endoscopy;  Laterality: N/A;  8:15  . Esophageal dilation N/A 09/09/2015    Procedure: ESOPHAGEAL DILATION;  Surgeon: Rogene Houston, MD;  [facility]: [facility];  Service: Endoscopy;  Laterality: N/A;   Social History:  reports that she has been smoking Cigarettes.  She has a 55 pack-year smoking history. She does not have any smokeless tobacco history on file. She reports that she drinks about 8.4 oz of alcohol per week. She reports that she does not use illicit drugs. Patient lives at home independently and ambulates independently.    Allergies  Allergen Reactions  . Other Nausea Only    Steroids    Family history: Patient is unaware of the cause of death of her parents. She states they both died of "old age".   Prior to Admission medications   Medication Sig Start Date End Date Taking? Authorizing Provider  ALPRAZolam (XANAX) 0.25 MG tablet Take 0.25 mg by mouth 2 (two) times daily as needed for anxiety.   Yes Historical Provider, MD  aspirin 81 MG tablet Take 81 mg by mouth daily.   Yes Historical Provider, MD  calcium-vitamin D (OSCAL WITH D) 500-200 MG-UNIT per  tablet Take 1 tablet by mouth daily with breakfast.   Yes Historical Provider, MD  Multiple Vitamins-Minerals (MULTIVITAMINS THER. W/MINERALS) TABS tablet Take 1 tablet by mouth daily.   Yes Historical Provider, MD  omeprazole (PRILOSEC) 20 MG capsule Take 20 mg by mouth daily.   Yes Historical Provider, MD  vitamin C (ASCORBIC ACID) 500 MG tablet Take 500 mg by mouth daily.   Yes Historical Provider, MD   Physical Exam: Filed Vitals:   09/16/15 1031 09/16/15 1100 09/16/15 1130 09/16/15 1415  BP:  126/68 99/77 144/68  Pulse: 104 104 104 106  Temp:    98.1 F (36.7 C)  TempSrc:    Oral  Resp: 17 11 12 18   Height:    5\' 8"  (1.727 m)  Weight:    48.535 kg (107 lb)  SpO2: 100% 100% 100% 89%    Wt Readings from Last 3 Encounters:  09/16/15 48.535 kg (107 lb)  09/07/15 48.535 kg (107 lb)  05/31/15 48.988 kg (108 lb)    General:  Appears calm and comfortable Eyes: PERRL, normal lids, irises & conjunctiva ENT: grossly normal hearing, lips &  tongue Neck: no LAD, masses or thyromegaly Cardiovascular: Mildly tachycardic, no m/r/g. No LE edema. Telemetry: Sinus tachycardia, no arrhythmias  Respiratory: CTA bilaterally, no w/r/r. Normal respiratory effort. Abdomen: soft, ntnd Skin: no rash or induration seen on limited exam Musculoskeletal: Left leg is shortened and externally rotated.  Psychiatric: grossly normal mood and affect, speech fluent and appropriate Neurologic: grossly non-focal.          Labs on Admission:  Basic Metabolic Panel:  Recent Labs Lab 09/16/15 0943  NA 135  K 3.7  CL 97*  CO2 31  GLUCOSE 102*  BUN 9  CREATININE 0.68  CALCIUM 9.0    CBC:  Recent Labs Lab 09/16/15 0943  WBC 9.6  NEUTROABS 8.4*  HGB 14.3  HCT 46.1*  MCV 72.9*  PLT 508*     Radiological Exams on Admission: Dg Chest 1 View  09/16/2015  CLINICAL DATA:  [place].  Left hip fracture. EXAM: CHEST 1 VIEW COMPARISON:  None available. FINDINGS: The cardiac silhouette, mediastinal and  hilar contours are within normal limits. There is tortuosity, ectasia and calcification of the thoracic aorta. Suspect a moderate-sized hiatal hernia. There are emphysematous changes and pulmonary scarring but no acute overlying pulmonary process. The bony thorax is intact. IMPRESSION: Emphysematous changes and pulmonary scarring but no acute overlying pulmonary process. Electronically Signed   By: Marijo Sanes M.D.   On: 09/16/2015 10:26   Ct Head Wo Contrast  09/16/2015  CLINICAL DATA:  Mechanical fall. Patient caught her foot and fell to her left hip earlier this morning. Patient hit her head when she fell. No loss of consciousness. History of TIA. EXAM: CT HEAD WITHOUT CONTRAST TECHNIQUE: Contiguous axial images were obtained from the base of the skull through the vertex without intravenous contrast. COMPARISON:  None. FINDINGS: There is central and cortical atrophy. Periventricular white matter changes are consistent with small vessel disease. There is no intra or extra-axial fluid collection or mass lesion. The basilar cisterns and ventricles have a normal appearance. There is no CT evidence for acute infarction or hemorrhage. Bone windows are unremarkable. IMPRESSION: 1. Atrophy and small vessel disease. 2.  No evidence for acute intracranial abnormality. Electronically Signed   By: Nolon Nations M.D.   On: 09/16/2015 10:43   Dg Hip Unilat With Pelvis 2-3 Views Left  09/16/2015  CLINICAL DATA:  [place].  Injured left hip. EXAM: DG HIP (WITH OR WITHOUT PELVIS) 2-3V LEFT COMPARISON:  None. FINDINGS: There is a displaced intertrochanteric fracture of the left hip. The right hip is normal. The pubic symphysis and SI joints are intact. No pelvic fractures. IMPRESSION: Displaced intertrochanteric fracture of the left hip Electronically Signed   By: Marijo Sanes M.D.   On: 09/16/2015 10:26    EKG: Independently reviewed. Sinus tachycardia, no acute changes.   Assessment/Plan Principal Problem:    Closed left hip fracture (HCC) Active Problems:   COPD (chronic obstructive pulmonary disease) (HCC)   Tobacco abuse   GERD (gastroesophageal reflux disease)   Fall   1. Displaced intertrochanteric fracture of the left hip. Operative management per orthopedics. Continue pain management and NPO. Physical therapy will be consulted after surgery. She may need discharge to SNF after surgery.  2. Peri-OP cardiac risk assessment.  Per Gupta's cardiac risk assessment, her risk for peri-operative major cardiac event is 2.09%. Since she is low risk, she should proceed with the necessary surgery.  3. Mechanical fall, patient denied any predisposing symptoms. She did hit her head but CT head  was negative for acute findings.  4. GERD, continue PPI. 5. COPD, no evidence of wheezing. Continue bronchodilators and pulmonary hygiene. Encourage incentive spirometry. 6. Tobacco abuse, consulted on the importance of cessation.  Will provide Nicotine patch.    Code Status:  Full DVT Prophylaxis: SCDs for now. Will defer to orthopedics to start pharmacologic DVT prophylaxis after surgery. Family Communication: Daughter at bedside. Disposition Plan: Admit to medical floor.   Time spent: 55 minutes  [employer]. MD [facility] Pager [phone]   By signing my name below, I, Rosalie Doctor, attest that this documentation has been prepared under the direction and in the presence of [facility]. MD Electronically Signed: Rosalie Doctor, Scribe. 09/16/2015 1:01pm  I, Dr. Kathie Dike, personally performed the services described in this documentaiton. All medical record entries made by the scribe were at my direction and in my presence. I have reviewed the chart and agree that the record reflects my personal performance and is accurate and complete  Kathie Dike, MD, 09/16/2015 2:35 PM

## 2015-09-16 NOTE — ED Notes (Signed)
Pt states she tripped and fell over her oxygen tubing at 0730 this am. Pt c/o left hip pain-deformity noted, shortening and external rotation of left leg. Pt also has hematoma to left forehead and skin tear to left forearm. Pt denies loc. EDP at bedside.

## 2015-09-17 DIAGNOSIS — S72142A Displaced intertrochanteric fracture of left femur, initial encounter for closed fracture: Secondary | ICD-10-CM | POA: Diagnosis not present

## 2015-09-17 DIAGNOSIS — E43 Unspecified severe protein-calorie malnutrition: Secondary | ICD-10-CM | POA: Insufficient documentation

## 2015-09-17 LAB — BASIC METABOLIC PANEL
Anion gap: 6 (ref 5–15)
BUN: 12 mg/dL (ref 6–20)
CO2: 32 mmol/L (ref 22–32)
Calcium: 8.4 mg/dL — ABNORMAL LOW (ref 8.9–10.3)
Chloride: 99 mmol/L — ABNORMAL LOW (ref 101–111)
Creatinine, Ser: 0.73 mg/dL (ref 0.44–1.00)
GFR calc Af Amer: >60 mL/min (ref >60)
GFR calc non Af Amer: >60 mL/min (ref >60)
Glucose, Bld: 87 mg/dL (ref 65–99)
Potassium: 3.7 mmol/L (ref 3.5–5.1)
Sodium: 137 mmol/L (ref 135–145)

## 2015-09-17 LAB — CBC
HCT: 40.8 % (ref 36.0–46.0)
Hemoglobin: 12.5 g/dL (ref 12.0–15.0)
MCH: 22.9 pg — ABNORMAL LOW (ref 26.0–34.0)
MCHC: 30.6 g/dL (ref 30.0–36.0)
MCV: 74.6 fL — ABNORMAL LOW (ref 78.0–100.0)
Platelets: 457 10*3/uL — ABNORMAL HIGH (ref 150–400)
RBC: 5.47 MIL/uL — ABNORMAL HIGH (ref 3.87–5.11)
RDW: 18.4 % — ABNORMAL HIGH (ref 11.5–15.5)
WBC: 7.2 10*3/uL (ref 4.0–10.5)

## 2015-09-17 LAB — SURGICAL PCR SCREEN
MRSA, PCR: NEGATIVE
Staphylococcus aureus: NEGATIVE

## 2015-09-17 NOTE — Progress Notes (Signed)
[facility] PROGRESS NOTE  Taylor Cannon [id number] DOB: 06/15/1934 DOA: 09/16/2015 PCP: Elyn Aquas  Assessment/Plan: 1. Displaced intertrochanteric fracture of the left hip. Operative management per orthopedics, anticipate surgery 09/1515. NPO after midnight. Pain well-controlled with current management. PT after surgery to determine discharge disposition.  2. Peri-OP cardiac risk assessment. Per Gupta's cardiac risk assessment, her risk for peri-operative major cardiac event is 2.09%. Since she is low risk, she should proceed with the necessary surgery.  3. Mechanical fall, patient denied any predisposing symptoms. Reported that she hit her head but CT head was negative for acute findings.  4. GERD, continue PPI. 5. COPD,stable. There is no evidence of wheezing. Continue bronchodilators and pulmonary hygiene. Encourage incentive spirometry. 6. Tobacco abuse, consulted on the importance of cessation.Continue Nicotine patch. 7. Severe malnutrition in the context of chronic illness, underweight. Nutrition following.    Code Status: Full DVT prophylaxis: SCDs Family Communication: Daughters bedside.   Disposition Plan: Discharge once improved    Consultants:  Orthopedics   Procedures:  None  Antibiotics:  None  HPI/Subjective: Pain is controlled with medications. Is uncomfortable in bed. Is concerned for worsening of chronic back pain.   Objective: Filed Vitals:   09/16/15 2250 09/17/15 0644  BP: 119/57 121/55  Pulse: 108 107  Temp: 98.5 F (36.9 C) 98 F (36.7 C)  Resp: 18 18    Intake/Output Summary (Last 24 hours) at 09/17/15 0751 Last data filed at 09/16/15 2047  Gross per 24 hour  Intake    120 ml  Output    475 ml  Net   -355 ml   Filed Weights   09/16/15 0919 09/16/15 1415  Weight: 48.535 kg (107 lb) 48.535 kg (107 lb)    Exam: General: NAD. Appears calm lying in bed.  Cardiovascular: RRR, S1, S2  Respiratory: clear bilaterally, No  wheezing, rales or rhonchi Abdomen: soft, non tender, no distention , bowel sounds normal Musculoskeletal: No edema b/l  Data Reviewed: Basic Metabolic Panel:  Recent Labs Lab 09/16/15 0943 09/17/15 0608  NA 135 137  K 3.7 3.7  CL 97* 99*  CO2 31 32  GLUCOSE 102* 87  BUN 9 12  CREATININE 0.68 0.73  CALCIUM 9.0 8.4*  CBC:  Recent Labs Lab 09/16/15 0943 09/17/15 0608  WBC 9.6 7.2  NEUTROABS 8.4*  --   HGB 14.3 12.5  HCT 46.1* 40.8  MCV 72.9* 74.6*  PLT 508* 457*     Studies: Dg Chest 1 View  09/16/2015  CLINICAL DATA:  [place].  Left hip fracture. EXAM: CHEST 1 VIEW COMPARISON:  None available. FINDINGS: The cardiac silhouette, mediastinal and hilar contours are within normal limits. There is tortuosity, ectasia and calcification of the thoracic aorta. Suspect a moderate-sized hiatal hernia. There are emphysematous changes and pulmonary scarring but no acute overlying pulmonary process. The bony thorax is intact. IMPRESSION: Emphysematous changes and pulmonary scarring but no acute overlying pulmonary process. Electronically Signed   By: Marijo Sanes M.D.   On: 09/16/2015 10:26   Ct Head Wo Contrast  09/16/2015  CLINICAL DATA:  Mechanical fall. Patient caught her foot and fell to her left hip earlier this morning. Patient hit her head when she fell. No loss of consciousness. History of TIA. EXAM: CT HEAD WITHOUT CONTRAST TECHNIQUE: Contiguous axial images were obtained from the base of the skull through the vertex without intravenous contrast. COMPARISON:  None. FINDINGS: There is central and cortical atrophy. Periventricular white matter changes are consistent with small vessel  disease. There is no intra or extra-axial fluid collection or mass lesion. The basilar cisterns and ventricles have a normal appearance. There is no CT evidence for acute infarction or hemorrhage. Bone windows are unremarkable. IMPRESSION: 1. Atrophy and small vessel disease. 2.  No evidence for acute  intracranial abnormality. Electronically Signed   By: Nolon Nations M.D.   On: 09/16/2015 10:43   Dg Hip Unilat With Pelvis 2-3 Views Left  09/16/2015  CLINICAL DATA:  [place].  Injured left hip. EXAM: DG HIP (WITH OR WITHOUT PELVIS) 2-3V LEFT COMPARISON:  None. FINDINGS: There is a displaced intertrochanteric fracture of the left hip. The right hip is normal. The pubic symphysis and SI joints are intact. No pelvic fractures. IMPRESSION: Displaced intertrochanteric fracture of the left hip Electronically Signed   By: Marijo Sanes M.D.   On: 09/16/2015 10:26    Scheduled Meds: . aspirin  81 mg Oral Daily  . heparin subcutaneous  5,000 Units Subcutaneous 3 times per day  . Influenza vac split quadrivalent PF  0.5 mL Intramuscular Tomorrow-1000  . nicotine  21 mg Transdermal Daily  . pantoprazole  40 mg Oral Daily   Continuous Infusions:   Principal Problem:   Closed left hip fracture (HCC) Active Problems:   COPD (chronic obstructive pulmonary disease) (HCC)   Tobacco abuse   GERD (gastroesophageal reflux disease)   Fall    Time spent: 35 minutes     .Kathie Dike, MD  [facility] Pager [phone]. If 7PM-7AM, please contact night-coverage at [email or web address], password [facility] 09/17/2015, 7:51 AM  LOS: 1 day     By signing my name below, I, Rennis Harding, attest that this documentation has been prepared under the direction and in the presence of Kathie Dike, MD. Electronically signed: Rennis Harding, Scribe. 09/17/2015 111:28am   I, Dr. Kathie Dike, personally performed the services described in this documentaiton. All medical record entries made by the scribe were at my direction and in my presence. I have reviewed the chart and agree that the record reflects my personal performance and is accurate and complete  Kathie Dike, MD, 09/17/2015 11:53 AM

## 2015-09-18 ENCOUNTER — Inpatient Hospital Stay (HOSPITAL_COMMUNITY): Payer: Medicare HMO | Admitting: Anesthesiology

## 2015-09-18 ENCOUNTER — Inpatient Hospital Stay (HOSPITAL_COMMUNITY): Payer: Medicare HMO

## 2015-09-18 ENCOUNTER — Encounter (HOSPITAL_COMMUNITY)
Admission: EM | Disposition: A | Discharge status: Skilled Nursing Facility | Payer: Self-pay | Source: Home / Self Care | Attending: Internal Medicine

## 2015-09-18 DIAGNOSIS — S72143A Displaced intertrochanteric fracture of unspecified femur, initial encounter for closed fracture: Secondary | ICD-10-CM | POA: Insufficient documentation

## 2015-09-18 DIAGNOSIS — S72142A Displaced intertrochanteric fracture of left femur, initial encounter for closed fracture: Secondary | ICD-10-CM

## 2015-09-18 HISTORY — DX: Displaced intertrochanteric fracture of left femur, initial encounter for closed fracture: S72.142A

## 2015-09-18 HISTORY — PX: INTRAMEDULLARY (IM) NAIL INTERTROCHANTERIC: SHX5875

## 2015-09-18 LAB — BASIC METABOLIC PANEL
Anion gap: 6 (ref 5–15)
BUN: 9 mg/dL (ref 6–20)
CO2: 31 mmol/L (ref 22–32)
Calcium: 8.9 mg/dL (ref 8.9–10.3)
Chloride: 98 mmol/L — ABNORMAL LOW (ref 101–111)
Creatinine, Ser: 0.68 mg/dL (ref 0.44–1.00)
GFR calc Af Amer: >60 mL/min (ref >60)
GFR calc non Af Amer: >60 mL/min (ref >60)
Glucose, Bld: 113 mg/dL — ABNORMAL HIGH (ref 65–99)
Potassium: 4.7 mmol/L (ref 3.5–5.1)
Sodium: 135 mmol/L (ref 135–145)

## 2015-09-18 LAB — CBC
HCT: 44.9 % (ref 36.0–46.0)
Hemoglobin: 13.4 g/dL (ref 12.0–15.0)
MCH: 22.3 pg — ABNORMAL LOW (ref 26.0–34.0)
MCHC: 29.8 g/dL — ABNORMAL LOW (ref 30.0–36.0)
MCV: 74.8 fL — ABNORMAL LOW (ref 78.0–100.0)
Platelets: 464 10*3/uL — ABNORMAL HIGH (ref 150–400)
RBC: 6 MIL/uL — ABNORMAL HIGH (ref 3.87–5.11)
RDW: 18.5 % — ABNORMAL HIGH (ref 11.5–15.5)
WBC: 10.1 10*3/uL (ref 4.0–10.5)

## 2015-09-18 SURGERY — FIXATION, FRACTURE, INTERTROCHANTERIC, WITH INTRAMEDULLARY ROD
Anesthesia: Spinal | Site: Hip | Laterality: Left

## 2015-09-18 MED ORDER — PHENYLEPHRINE HCL 10 MG/ML IJ SOLN
INTRAMUSCULAR | Status: DC | PRN
  Administered 2015-09-18 (×2): 40 ug via INTRAVENOUS
  Administered 2015-09-18: 80 ug via INTRAVENOUS
  Administered 2015-09-18: 40 ug via INTRAVENOUS
  Administered 2015-09-18 (×2): 80 ug via INTRAVENOUS
  Administered 2015-09-18: 40 ug via INTRAVENOUS

## 2015-09-18 MED ORDER — ONDANSETRON HCL 4 MG/2ML IJ SOLN
4.0000 mg | Freq: Four times a day (QID) | INTRAMUSCULAR | Status: DC | PRN
Start: 2015-09-18 — End: 2015-09-20
  Administered 2015-09-19 (×2): 4 mg via INTRAVENOUS
  Filled 2015-09-18 (×3): qty 2

## 2015-09-18 MED ORDER — ACETAMINOPHEN 10 MG/ML IV SOLN
1000.0000 mg | Freq: Once | INTRAVENOUS | Status: AC
  Administered 2015-09-18: 1000 mg via INTRAVENOUS

## 2015-09-18 MED ORDER — TRAMADOL HCL 50 MG PO TABS
ORAL_TABLET | ORAL | Status: AC
  Filled 2015-09-18: qty 1

## 2015-09-18 MED ORDER — DEXAMETHASONE SODIUM PHOSPHATE 10 MG/ML IJ SOLN
INTRAMUSCULAR | Status: DC | PRN
  Administered 2015-09-18: 4 mg via INTRAVENOUS

## 2015-09-18 MED ORDER — SODIUM CHLORIDE 0.9 % IR SOLN
Status: DC | PRN
  Administered 2015-09-18: 1000 mL

## 2015-09-18 MED ORDER — ACETAMINOPHEN 10 MG/ML IV SOLN
INTRAVENOUS | Status: AC
  Filled 2015-09-18: qty 100

## 2015-09-18 MED ORDER — PHENYLEPHRINE 40 MCG/ML (10ML) SYRINGE FOR IV PUSH (FOR BLOOD PRESSURE SUPPORT)
PREFILLED_SYRINGE | INTRAVENOUS | Status: AC
  Filled 2015-09-18: qty 10

## 2015-09-18 MED ORDER — TRAMADOL HCL 50 MG PO TABS
50.0000 mg | ORAL_TABLET | Freq: Once | ORAL | Status: AC
  Administered 2015-09-18: 50 mg via ORAL

## 2015-09-18 MED ORDER — SORBITOL 70 % SOLN: 30.0000 mL | Freq: Every day | Status: DC | PRN

## 2015-09-18 MED ORDER — BUPIVACAINE IN DEXTROSE 0.75-8.25 % IT SOLN
INTRATHECAL | Status: DC | PRN
  Administered 2015-09-18: 15 mg via INTRATHECAL

## 2015-09-18 MED ORDER — CEFAZOLIN SODIUM-DEXTROSE 2-3 GM-% IV SOLR
2.0000 g | Freq: Four times a day (QID) | INTRAVENOUS | Status: AC
  Administered 2015-09-18 (×2): 2 g via INTRAVENOUS
  Filled 2015-09-18 (×2): qty 50

## 2015-09-18 MED ORDER — TRAMADOL HCL 50 MG PO TABS
50.0000 mg | ORAL_TABLET | Freq: Four times a day (QID) | ORAL | Status: DC
  Administered 2015-09-18 – 2015-09-20 (×9): 50 mg via ORAL
  Filled 2015-09-18 (×9): qty 1

## 2015-09-18 MED ORDER — LACTATED RINGERS IV SOLN
INTRAVENOUS | Status: DC | PRN
  Administered 2015-09-18 (×2): via INTRAVENOUS

## 2015-09-18 MED ORDER — MIDAZOLAM HCL 2 MG/2ML IJ SOLN
INTRAMUSCULAR | Status: AC
  Filled 2015-09-18: qty 4

## 2015-09-18 MED ORDER — ACETAMINOPHEN 10 MG/ML IV SOLN
1000.0000 mg | Freq: Four times a day (QID) | INTRAVENOUS | Status: AC
  Administered 2015-09-18 – 2015-09-19 (×3): 1000 mg via INTRAVENOUS
  Filled 2015-09-18 (×4): qty 100

## 2015-09-18 MED ORDER — METOCLOPRAMIDE HCL 10 MG PO TABS
5.0000 mg | ORAL_TABLET | Freq: Three times a day (TID) | ORAL | Status: DC | PRN
Start: 2015-09-18 — End: 2015-09-20

## 2015-09-18 MED ORDER — BUPIVACAINE-EPINEPHRINE (PF) 0.5% -1:200000 IJ SOLN
INTRAMUSCULAR | Status: DC | PRN
  Administered 2015-09-18: 60 mL via PERINEURAL

## 2015-09-18 MED ORDER — ASPIRIN EC 325 MG PO TBEC
325.0000 mg | DELAYED_RELEASE_TABLET | Freq: Every day | ORAL | Status: DC
  Administered 2015-09-19 – 2015-09-20 (×2): 325 mg via ORAL
  Filled 2015-09-18 (×2): qty 1

## 2015-09-18 MED ORDER — MIDAZOLAM HCL 5 MG/5ML IJ SOLN
INTRAMUSCULAR | Status: DC | PRN
  Administered 2015-09-18 (×2): 1 mg via INTRAVENOUS
  Administered 2015-09-18 (×2): 0.5 mg via INTRAVENOUS

## 2015-09-18 MED ORDER — MENTHOL 3 MG MT LOZG: 1.0000 | LOZENGE | OROMUCOSAL | Status: DC | PRN

## 2015-09-18 MED ORDER — FENTANYL CITRATE (PF) 100 MCG/2ML IJ SOLN
INTRAMUSCULAR | Status: DC | PRN
  Administered 2015-09-18: 25 ug via INTRATHECAL
  Administered 2015-09-18: 25 ug via INTRAVENOUS

## 2015-09-18 MED ORDER — SODIUM CHLORIDE 0.9 % IV SOLN
INTRAVENOUS | Status: DC
  Administered 2015-09-18 – 2015-09-19 (×2): via INTRAVENOUS

## 2015-09-18 MED ORDER — PROPOFOL 500 MG/50ML IV EMUL
INTRAVENOUS | Status: DC | PRN
  Administered 2015-09-18: 25 ug/kg/min via INTRAVENOUS

## 2015-09-18 MED ORDER — METOCLOPRAMIDE HCL 5 MG/ML IJ SOLN: 5.0000 mg | Freq: Three times a day (TID) | INTRAMUSCULAR | Status: DC | PRN

## 2015-09-18 MED ORDER — CHLORHEXIDINE GLUCONATE 4 % EX LIQD
60.0000 mL | Freq: Once | CUTANEOUS | Status: AC
Start: 2015-09-18 — End: 2015-09-18
  Administered 2015-09-18: 4 via TOPICAL
  Filled 2015-09-18: qty 15

## 2015-09-18 MED ORDER — SENNA 8.6 MG PO TABS
1.0000 | ORAL_TABLET | Freq: Two times a day (BID) | ORAL | Status: DC
  Administered 2015-09-18 – 2015-09-20 (×5): 8.6 mg via ORAL
  Filled 2015-09-18 (×5): qty 1

## 2015-09-18 MED ORDER — METOCLOPRAMIDE HCL 5 MG/ML IJ SOLN
INTRAMUSCULAR | Status: AC
  Filled 2015-09-18: qty 2

## 2015-09-18 MED ORDER — CEFAZOLIN SODIUM-DEXTROSE 2-3 GM-% IV SOLR
2.0000 g | INTRAVENOUS | Status: DC
  Filled 2015-09-18: qty 50

## 2015-09-18 MED ORDER — BUPIVACAINE IN DEXTROSE 0.75-8.25 % IT SOLN
INTRATHECAL | Status: AC
  Filled 2015-09-18: qty 2

## 2015-09-18 MED ORDER — METOCLOPRAMIDE HCL 5 MG/ML IJ SOLN
INTRAMUSCULAR | Status: DC | PRN
  Administered 2015-09-18: 10 mg via INTRAVENOUS

## 2015-09-18 MED ORDER — CEFAZOLIN SODIUM-DEXTROSE 2-3 GM-% IV SOLR
2.0000 g | INTRAVENOUS | Status: AC
  Administered 2015-09-18: 2 g via INTRAVENOUS
  Filled 2015-09-18 (×2): qty 50

## 2015-09-18 MED ORDER — MAGNESIUM CITRATE PO SOLN: 1.0000 | Freq: Once | ORAL | Status: DC | PRN

## 2015-09-18 MED ORDER — FENTANYL CITRATE (PF) 100 MCG/2ML IJ SOLN
INTRAMUSCULAR | Status: AC
  Filled 2015-09-18: qty 2

## 2015-09-18 MED ORDER — ONDANSETRON HCL 4 MG/2ML IJ SOLN
INTRAMUSCULAR | Status: DC | PRN
  Administered 2015-09-18: 4 mg via INTRAVENOUS

## 2015-09-18 MED ORDER — PHENOL 1.4 % MT LIQD: 1.0000 | OROMUCOSAL | Status: DC | PRN

## 2015-09-18 MED ORDER — PROPOFOL 10 MG/ML IV BOLUS
INTRAVENOUS | Status: AC
  Filled 2015-09-18: qty 20

## 2015-09-18 MED ORDER — ONDANSETRON HCL 4 MG PO TABS
4.0000 mg | ORAL_TABLET | Freq: Four times a day (QID) | ORAL | Status: DC | PRN
  Administered 2015-09-19 – 2015-09-20 (×2): 4 mg via ORAL
  Filled 2015-09-18 (×2): qty 1

## 2015-09-18 MED ORDER — BUPIVACAINE-EPINEPHRINE (PF) 0.5% -1:200000 IJ SOLN
INTRAMUSCULAR | Status: AC
  Filled 2015-09-18: qty 60

## 2015-09-18 SURGICAL SUPPLY — 56 items
BAG HAMPER (MISCELLANEOUS) ×2 IMPLANT
BIT DRILL AO GAMMA 4.2X300 (BIT) ×2 IMPLANT
BLADE HEX COATED 2.75 (ELECTRODE) ×2 IMPLANT
BLADE SURG SZ10 CARB STEEL (BLADE) ×4 IMPLANT
BNDG GAUZE ELAST 4 BULKY (GAUZE/BANDAGES/DRESSINGS) ×2 IMPLANT
CHLORAPREP W/TINT 26ML (MISCELLANEOUS) ×2 IMPLANT
CLOTH BEACON ORANGE TIMEOUT ST (SAFETY) ×2 IMPLANT
COVER LIGHT HANDLE STERIS (MISCELLANEOUS) ×4 IMPLANT
COVER MAYO STAND XLG (DRAPE) ×2 IMPLANT
DECANTER SPIKE VIAL GLASS SM (MISCELLANEOUS) ×4 IMPLANT
DRAPE STERI IOBAN 125X83 (DRAPES) ×2 IMPLANT
DRSG AQUACEL AG ADV 3.5X10 (GAUZE/BANDAGES/DRESSINGS) ×2 IMPLANT
DRSG MEPILEX BORDER 4X12 (GAUZE/BANDAGES/DRESSINGS) ×2 IMPLANT
ELECT REM PT RETURN 9FT ADLT (ELECTROSURGICAL) ×2
ELECTRODE REM PT RTRN 9FT ADLT (ELECTROSURGICAL) ×1 IMPLANT
GLOVE BIOGEL PI IND STRL 7.0 (GLOVE) ×1 IMPLANT
GLOVE BIOGEL PI INDICATOR 7.0 (GLOVE) ×2
GLOVE ECLIPSE 6.5 STRL STRAW (GLOVE) ×2 IMPLANT
GLOVE EXAM NITRILE MD LF STRL (GLOVE) ×1 IMPLANT
GLOVE SKINSENSE NS SZ8.0 LF (GLOVE) ×1
GLOVE SKINSENSE STRL SZ8.0 LF (GLOVE) ×1 IMPLANT
GLOVE SS N UNI LF 8.5 STRL (GLOVE) ×2 IMPLANT
GOWN STRL REUS W/TWL LRG LVL3 (GOWN DISPOSABLE) ×5 IMPLANT
GOWN STRL REUS W/TWL XL LVL3 (GOWN DISPOSABLE) ×2 IMPLANT
GUIDEROD T2 3X1000 (ROD) ×2 IMPLANT
INST SET MAJOR BONE (KITS) ×2 IMPLANT
K-WIRE  3.2X450M STR (WIRE) ×1
K-WIRE 3.2X450M STR (WIRE) ×1
KIT BLADEGUARD II DBL (SET/KITS/TRAYS/PACK) ×2 IMPLANT
KIT ROOM TURNOVER AP CYSTO (KITS) ×2 IMPLANT
KWIRE 3.2X450M STR (WIRE) ×1 IMPLANT
MANIFOLD NEPTUNE II (INSTRUMENTS) ×2 IMPLANT
MARKER SKIN DUAL TIP RULER LAB (MISCELLANEOUS) ×2 IMPLANT
NAIL TROCH GAMMA 11X18 (Nail) ×1 IMPLANT
NDL HYPO 21X1.5 SAFETY (NEEDLE) ×1 IMPLANT
NDL SPNL 18GX3.5 QUINCKE PK (NEEDLE) ×1 IMPLANT
NEEDLE HYPO 21X1.5 SAFETY (NEEDLE) ×2 IMPLANT
NEEDLE SPNL 18GX3.5 QUINCKE PK (NEEDLE) ×2 IMPLANT
NS IRRIG 1000ML POUR BTL (IV SOLUTION) ×2 IMPLANT
PACK BASIC III (CUSTOM PROCEDURE TRAY) ×2
PACK SRG BSC III STRL LF ECLPS (CUSTOM PROCEDURE TRAY) ×1 IMPLANT
PENCIL HANDSWITCHING (ELECTRODE) ×2 IMPLANT
SCREW LAG GAMMA 3 TI 10.5X90MM (Screw) ×1 IMPLANT
SCREW LOCKING T2 F/T  5MMX35MM (Screw) ×1 IMPLANT
SCREW LOCKING T2 F/T 5MMX35MM (Screw) IMPLANT
SET BASIN LINEN APH (SET/KITS/TRAYS/PACK) ×2 IMPLANT
SLING ARM FOAM STRAP LRG (SOFTGOODS) ×1 IMPLANT
SLING ARM FOAM STRAP XLG (SOFTGOODS) ×1 IMPLANT
SPONGE LAP 18X18 X RAY DECT (DISPOSABLE) ×4 IMPLANT
STAPLER VISISTAT 35W (STAPLE) ×1 IMPLANT
SUT MNCRL 0 VIOLET CTX 36 (SUTURE) ×1 IMPLANT
SUT MON AB 2-0 CT1 36 (SUTURE) ×2 IMPLANT
SUT MONOCRYL 0 CTX 36 (SUTURE) ×1
SYR 30ML LL (SYRINGE) ×2 IMPLANT
SYR BULB IRRIGATION 50ML (SYRINGE) ×4 IMPLANT
YANKAUER SUCT BULB TIP 10FT TU (MISCELLANEOUS) ×3 IMPLANT

## 2015-09-18 NOTE — Anesthesia Preprocedure Evaluation (Addendum)
Anesthesia Evaluation  Patient identified by MRN, date of birth, ID band Patient awake    Reviewed: Allergy & Precautions, NPO status , Patient's Chart, lab work & pertinent test results, reviewed documented beta blocker date and time   Airway Mallampati: I   Neck ROM: Full    Dental  (+) Edentulous Upper, Edentulous Lower   Pulmonary shortness of breath, with exertion and Long-Term Oxygen Therapy, COPD,  oxygen dependent, Current Smoker,     + decreased breath sounds      Cardiovascular Exercise Tolerance: Good  Rhythm:Regular Rate:Normal     Neuro/Psych Anxiety Depression TIA   GI/Hepatic GERD  Controlled,  Endo/Other    Renal/GU      Musculoskeletal   Abdominal (+)  Abdomen: soft. Bowel sounds: normal.  Peds  Hematology   Anesthesia Other Findings   Reproductive/Obstetrics                          Anesthesia Physical Anesthesia Plan  ASA: III and emergent  Anesthesia Plan: Spinal   Post-op Pain Management:    Induction:   Airway Management Planned: Simple Face Mask  Additional Equipment:   Intra-op Plan:   Post-operative Plan:   Informed Consent: I have reviewed the patients History and Physical, chart, labs and discussed the procedure including the risks, benefits and alternatives for the proposed anesthesia with the patient or authorized representative who has indicated his/her understanding and acceptance.     Plan Discussed with: CRNA and Anesthesiologist  Anesthesia Plan Comments:        Anesthesia Quick Evaluation

## 2015-09-18 NOTE — Progress Notes (Signed)
Pt had a temperature of 101.1. Tylenol was given. MD notified/aware. Oswald Hillock, RN

## 2015-09-18 NOTE — Op Note (Signed)
Operative report today's date 09/18/2015  82 year old female sustained a mechanical fall was admitted to the hospital and workup revealed a three-part intertrochanteric fracture of the left hip  Preop diagnosis left hip fracture/three-part intertrochanteric stable hip fracture Postop diagnosis same Procedure open treatment internal fixation left hip with short gamma nail Surgeon Aline Brochure No assistants Anesthesia spinal Findings three-part intertrochanteric fracture left hip with intact posterior medial buttress  No complications Sponge needle counts were correct  Details of procedure First identified the patient in the preop area and confirm the surgical site and marked as left hip. Chart confirmation was completed. Patient taken to surgery. The patient was given a spinal anesthetic by the anesthesia department and placed on the operating table which was fracture table in the supine position. Her right leg was flexed abduction and neck sternal rotated and placed in a padded leg holder and the left leg was placed in traction  The C-arm was brought in and gentle traction and internal rotation produced a stable reduction. This was confirmed with AP and lateral x-rays using the C-arm  The hip and leg were then prepped and draped sterilely timeout was completed  The incision was made over the greater trochanter extended proximally and distally. Fascia was divided in line with the skin incision. Blunt dissection was carried out to find the tip of the trochanter. A curved awl was passed down the center the femoral canal at the tip of the trochanter and a curved guidewire was placed. Radiographs were used to confirm position of the guidewire. The proximal reamer was then passed per manufacture technique and the nail was in slight slit over the guidewire and the guidewire was removed. A second incision was made and the cannula was advanced to bone; Cortical perforation drill was used to open up the  lateral cortex followed by a threaded tip guidewire which was placed in the center the femoral head and confirmed by AP and lateral C-arm x-rays  This measured 90 mm. The triple reamer was then set for 90 mm and passed over the threaded tip guidewire which was left in place. Cannulated lag screw was then passed over the guidewire until it was within an acceptable tip apex distance.  Proximal acorn was placed confirmed by x-ray. Engagement was checked by toggling the screwdriver which was attached to the lag screw. This was then backed off one quarter turn to place it in dynamic mode.  A second stab wound was made for the distal locking screw with a cane the construct was advanced to bone appropriate drill was passed. This measured 35 mm and a 35 mm distal locking bolt was placed.  Radiographs confirmed position of all hardware and fracture reduction was confirmed  The targeting devices were removed  The wounds were thoroughly irrigated and closed proximally with #1 Bralon for the fascial layer 0 Monocryl for the subcutaneous layer. 2-0 Monocryl was used to close the stab wound in the staples were used to reapproximate the skin edges  60 mL of Marcaine with epinephrine were injected in the wounds to help with postoperative anesthesia  Patient was taken to the recovery room in stable condition.  Family was contacted.  Postop plan weightbearing as tolerated DVT prevention 28 days  Staples to be removed at postop day 12-14 Follow-up visit postop day 28 approximately with with x-rays

## 2015-09-18 NOTE — Transfer of Care (Signed)
Immediate Anesthesia Transfer of Care Note  Patient: Taylor Cannon  Procedure(s) Performed: Procedure(s): INTRAMEDULLARY (IM) NAIL INTERTROCHANTRIC (Left)  Patient Location: PACU  Anesthesia Type:Spinal  Level of Consciousness: awake and patient cooperative  Airway & Oxygen Therapy: Patient Spontanous Breathing and Patient connected to face mask oxygen  Post-op Assessment: Report given to RN and Post -op Vital signs reviewed and stable  Post vital signs: Reviewed and stable  Last Vitals:  Filed Vitals:   09/17/15 2352 09/18/15 0635  BP:  146/78  Pulse:  118  Temp: 37.8 C 38.4 C  Resp:  18    Complications: No apparent anesthesia complications

## 2015-09-18 NOTE — Progress Notes (Signed)
[facility] PROGRESS NOTE  Taylor Cannon [id number] DOB: 1934-01-01 DOA: 09/16/2015 PCP: Elyn Aquas  Assessment/Plan: 1. Displaced intertrochanteric fracture of the left hip. Status post ORIF of left hip on 1/15. Pain well-controlled with current management. PT after surgery to determine discharge disposition, but will likely need nursing home placement.  2. Mechanical fall, patient denied any predisposing symptoms. Reported that she hit her head but CT head was negative for acute findings.  3. GERD, continue PPI. 4. COPD,stable. There is no evidence of wheezing. Continue bronchodilators and pulmonary hygiene. Encourage incentive spirometry. 5. Tobacco abuse, consulted on the importance of cessation.Continue Nicotine patch. 6. Severe malnutrition in the context of chronic illness, underweight. Nutrition following.    Code Status: Full DVT prophylaxis: SCDs Family Communication: Discussed with daughter  Disposition Plan: Discharge once improved    Consultants:  Orthopedics   Procedures:  None  Antibiotics:  None  HPI/Subjective: Patient is seen in her room post operatively. She feels that her pain is reasonably controlled. She is not having any complaints today.  Objective: Filed Vitals:   09/17/15 2352 09/18/15 0635  BP:  146/78  Pulse:  118  Temp: 100 F (37.8 C) 101.1 F (38.4 C)  Resp:  18    Intake/Output Summary (Last 24 hours) at 09/18/15 0805 Last data filed at 09/17/15 2000  Gross per 24 hour  Intake    480 ml  Output    875 ml  Net   -395 ml   Filed Weights   09/16/15 0919 09/16/15 1415  Weight: 48.535 kg (107 lb) 48.535 kg (107 lb)    Exam: General: NAD. Appears calm lying in bed.  Cardiovascular: RRR, S1, S2  Respiratory: diminished breath sounds Abdomen: soft, non tender, no distention , bowel sounds normal Musculoskeletal: No edema b/l  Data Reviewed: Basic Metabolic Panel:  Recent Labs Lab 09/16/15 0943  09/17/15 0608 09/18/15 0705  NA 135 137 135  K 3.7 3.7 4.7  CL 97* 99* 98*  CO2 31 32 31  GLUCOSE 102* 87 113*  BUN 9 12 9   CREATININE 0.68 0.73 0.68  CALCIUM 9.0 8.4* 8.9  CBC:  Recent Labs Lab 09/16/15 0943 09/17/15 0608 09/18/15 0705  WBC 9.6 7.2 10.1  NEUTROABS 8.4*  --   --   HGB 14.3 12.5 13.4  HCT 46.1* 40.8 44.9  MCV 72.9* 74.6* 74.8*  PLT 508* 457* 464*     Studies: Dg Chest 1 View  09/16/2015  CLINICAL DATA:  [place].  Left hip fracture. EXAM: CHEST 1 VIEW COMPARISON:  None available. FINDINGS: The cardiac silhouette, mediastinal and hilar contours are within normal limits. There is tortuosity, ectasia and calcification of the thoracic aorta. Suspect a moderate-sized hiatal hernia. There are emphysematous changes and pulmonary scarring but no acute overlying pulmonary process. The bony thorax is intact. IMPRESSION: Emphysematous changes and pulmonary scarring but no acute overlying pulmonary process. Electronically Signed   By: Marijo Sanes M.D.   On: 09/16/2015 10:26   Ct Head Wo Contrast  09/16/2015  CLINICAL DATA:  Mechanical fall. Patient caught her foot and fell to her left hip earlier this morning. Patient hit her head when she fell. No loss of consciousness. History of TIA. EXAM: CT HEAD WITHOUT CONTRAST TECHNIQUE: Contiguous axial images were obtained from the base of the skull through the vertex without intravenous contrast. COMPARISON:  None. FINDINGS: There is central and cortical atrophy. Periventricular white matter changes are consistent with small vessel disease. There is no intra or  extra-axial fluid collection or mass lesion. The basilar cisterns and ventricles have a normal appearance. There is no CT evidence for acute infarction or hemorrhage. Bone windows are unremarkable. IMPRESSION: 1. Atrophy and small vessel disease. 2.  No evidence for acute intracranial abnormality. Electronically Signed   By: Nolon Nations M.D.   On: 09/16/2015 10:43   Dg Hip  Unilat With Pelvis 2-3 Views Left  09/16/2015  CLINICAL DATA:  [place].  Injured left hip. EXAM: DG HIP (WITH OR WITHOUT PELVIS) 2-3V LEFT COMPARISON:  None. FINDINGS: There is a displaced intertrochanteric fracture of the left hip. The right hip is normal. The pubic symphysis and SI joints are intact. No pelvic fractures. IMPRESSION: Displaced intertrochanteric fracture of the left hip Electronically Signed   By: Marijo Sanes M.D.   On: 09/16/2015 10:26    Scheduled Meds: . aspirin  81 mg Oral Daily  .  ceFAZolin (ANCEF) IV  2 g Intravenous On Call to OR  . nicotine  21 mg Transdermal Daily  . pantoprazole  40 mg Oral Daily   Continuous Infusions:   Principal Problem:   Closed left hip fracture (HCC) Active Problems:   COPD (chronic obstructive pulmonary disease) (HCC)   Tobacco abuse   GERD (gastroesophageal reflux disease)   Fall   Protein-calorie malnutrition, severe    Time spent: 25 minutes    Jehanzeb Memon. MD  [facility] Pager [phone]. If 7PM-7AM, please contact night-coverage at [email or web address], password [facility] 09/18/2015, 8:05 AM  LOS: 2 days

## 2015-09-18 NOTE — Brief Op Note (Signed)
09/16/2015 - 09/18/2015  11:54 AM  PATIENT:  Taylor Cannon  82 y.o. female  PRE-OPERATIVE DIAGNOSIS:  left hip intertrchanteric fracture  POST-OPERATIVE DIAGNOSIS:  left hip intertrchanteric fracture  PROCEDURE:  Procedure(s): INTRAMEDULLARY (IM) NAIL INTERTROCHANTRIC (Left)   The implants used were the Stryker gamma nail the 125 short nail the 35 mm distal locking screw, acorn screw with dynamic mode and the 90 mm lag screw  SURGEON:  Surgeon(s) and Role:    * Carole Civil, MD - Primary  PHYSICIAN ASSISTANT:   ASSISTANTS: none   ANESTHESIA:   spinal  EBL:  Total I/O In: 1200 [I.V.:1200] Out: 100 [Urine:100]  BLOOD ADMINISTERED:none  DRAINS: none   LOCAL MEDICATIONS USED:  MARCAINE   , Amount: 60 ml and OTHER with epi   SPECIMEN:  No Specimen  DISPOSITION OF SPECIMEN:  N/A  COUNTS:  YES  TOURNIQUET:  * No tourniquets in log *  DICTATION: .Dragon Dictation  PLAN OF CARE: Admit to inpatient   PATIENT DISPOSITION:  PACU - hemodynamically stable.   Delay start of Pharmacological VTE agent (>24hrs) due to surgical blood loss or risk of bleeding: yes

## 2015-09-19 ENCOUNTER — Encounter (HOSPITAL_COMMUNITY): Payer: Self-pay | Admitting: Orthopedic Surgery

## 2015-09-19 DIAGNOSIS — S72142D Displaced intertrochanteric fracture of left femur, subsequent encounter for closed fracture with routine healing: Secondary | ICD-10-CM

## 2015-09-19 LAB — BASIC METABOLIC PANEL
Anion gap: 5 (ref 5–15)
BUN: 11 mg/dL (ref 6–20)
CO2: 30 mmol/L (ref 22–32)
Calcium: 8 mg/dL — ABNORMAL LOW (ref 8.9–10.3)
Chloride: 98 mmol/L — ABNORMAL LOW (ref 101–111)
Creatinine, Ser: 0.72 mg/dL (ref 0.44–1.00)
GFR calc Af Amer: >60 mL/min (ref >60)
GFR calc non Af Amer: >60 mL/min (ref >60)
Glucose, Bld: 79 mg/dL (ref 65–99)
Potassium: 4.4 mmol/L (ref 3.5–5.1)
Sodium: 133 mmol/L — ABNORMAL LOW (ref 135–145)

## 2015-09-19 LAB — CBC
HCT: 36.4 % (ref 36.0–46.0)
Hemoglobin: 11.2 g/dL — ABNORMAL LOW (ref 12.0–15.0)
MCH: 23.2 pg — ABNORMAL LOW (ref 26.0–34.0)
MCHC: 30.8 g/dL (ref 30.0–36.0)
MCV: 75.4 fL — ABNORMAL LOW (ref 78.0–100.0)
Platelets: 446 10*3/uL — ABNORMAL HIGH (ref 150–400)
RBC: 4.83 MIL/uL (ref 3.87–5.11)
RDW: 18.7 % — ABNORMAL HIGH (ref 11.5–15.5)
WBC: 9.5 10*3/uL (ref 4.0–10.5)

## 2015-09-19 MED ORDER — OXYCODONE HCL 5 MG PO TABS
5.0000 mg | ORAL_TABLET | Freq: Four times a day (QID) | ORAL | Status: DC | PRN
  Administered 2015-09-19: 5 mg via ORAL
  Administered 2015-09-19: 10 mg via ORAL
  Filled 2015-09-19: qty 2
  Filled 2015-09-19: qty 1

## 2015-09-19 MED ORDER — ACETAMINOPHEN 10 MG/ML IV SOLN
INTRAVENOUS | Status: AC
  Filled 2015-09-19: qty 100

## 2015-09-19 MED ORDER — ADULT MULTIVITAMIN W/MINERALS CH
1.0000 | ORAL_TABLET | Freq: Every day | ORAL | Status: DC
  Administered 2015-09-19 – 2015-09-20 (×2): 1 via ORAL
  Filled 2015-09-19 (×2): qty 1

## 2015-09-19 MED ORDER — ENSURE ENLIVE PO LIQD
237.0000 mL | Freq: Two times a day (BID) | ORAL | Status: DC
  Administered 2015-09-19 – 2015-09-20 (×3): 237 mL via ORAL

## 2015-09-19 NOTE — Addendum Note (Signed)
Addendum  created 09/19/15 1333 by Charmaine Downs, CRNA   Modules edited: Clinical Notes   Clinical Notes:  File: [id number]

## 2015-09-19 NOTE — Clinical Social Work Note (Signed)
Clinical Social Work Assessment  Patient Details  Name: Taylor Cannon MRN: [id number] Date of Birth: 1933-09-24  Date of referral:  09/19/15               Reason for consult:  Discharge Planning                Permission sought to share information with:  Family Supports Permission granted to share information::     Name::     Sonia Baller  Agency::     Relationship::  daughter  Contact Information:     Housing/Transportation Living arrangements for the past 2 months:  Single Family Home Source of Information:  Patient, Adult Children Patient Interpreter Needed:  None Criminal Activity/Legal Involvement Pertinent to Current Situation/Hospitalization:  No - Comment as needed Significant Relationships:  Adult Children Lives with:  Self Do you feel safe going back to the place where you live?  No Need for family participation in patient care:  Yes (Comment)  Care giving concerns:  Pt lives alone.    Social Worker assessment / plan:  CSW met with pt and pt's daughter, Sonia Baller at bedside. Pt alert and oriented and reports she lives alone. Sonia Baller lives out of town, but pt's daughter [name] lives next door. Pt admitted due to hip fracture and is post op day 1. At baseline, pt is independent and still drives. She has difficulty accepting help and admits that this will be difficult for her. Pt evaluated pt and recommend SNF. CSW discussed placement process and insurance authorization. Pt agreeable and requests either [facility] or [facility] in [place].   Employment status:  Retired Nurse, adult PT Recommendations:  [place] / Referral to community resources:  [place]  Patient/Family's Response to care:  Pt and family agreeable to short term SNF prior to return home.   Patient/Family's Understanding of and Emotional Response to Diagnosis, Current Treatment, and Prognosis:  Pt aware of recommendations. She states that she gets  upset when thinking about not being able to take care of herself as she has been very healthy. Support provided.   Emotional Assessment Appearance:  Appears stated age Attitude/Demeanor/Rapport:  Other (Cooperative) Affect (typically observed):  Appropriate, Pleasant Orientation:  Oriented to Self, Oriented to  Time, Oriented to Place, Oriented to Situation Alcohol / Substance use:  Not Applicable Psych involvement (Current and /or in the community):  No (Comment)  Discharge Needs  Concerns to be addressed:  Discharge Planning Concerns Readmission within the last 30 days:  No Current discharge risk:  Physical Impairment Barriers to Discharge:  Continued Medical Work up   Salome Arnt, [place] 09/19/2015, 9:54 AM [phone]

## 2015-09-19 NOTE — NC FL2 (Signed)
[place] MEDICAID FL2 LEVEL OF CARE SCREENING TOOL     IDENTIFICATION  Patient Name: Taylor Cannon Birthdate: July 24, 1934 Sex: female Admission Date (Current Location): 09/16/2015  [facility] and [place] Number:      Facility and Address:  [facility] [address], [place]      Provider Number: [id number]  Attending Physician Name and Address:  Kathie Dike, MD  Relative Name and Phone Number:       Current Level of Care: Hospital Recommended Level of Care: [place] Prior Approval Number:    Date Approved/Denied:   PASRR Number:    Discharge Plan: SNF    Current Diagnoses: Patient Active Problem List   Diagnosis Date Noted  . Intertrochanteric fracture of left femur ([place]) 09/18/2015  . Closed fracture of intertrochanteric section of femur ([place])   . Protein-calorie malnutrition, severe 09/17/2015  . Closed left hip fracture ([place]) 09/16/2015  . COPD (chronic obstructive pulmonary disease) ([place]) 09/16/2015  . Tobacco abuse 09/16/2015  . GERD (gastroesophageal reflux disease) 09/16/2015  . Fall 09/16/2015  . Dysphagia, pharyngoesophageal phase 10/05/2014  . Benign paroxysmal positional vertigo 10/05/2014    Orientation RESPIRATION BLADDER Height & Weight    Self, Time, Situation, Place  O2 (2L) Continent 5\' 8"  (172.7 cm) 107 lbs.  BEHAVIORAL SYMPTOMS/MOOD NEUROLOGICAL BOWEL NUTRITION STATUS  Other (Comment) (n/a)  (n/a) Continent Diet (Regular)  AMBULATORY STATUS COMMUNICATION OF NEEDS Skin   Extensive Assist Verbally Skin abrasions                       Personal Care Assistance Level of Assistance  Bathing, Feeding, Dressing Bathing Assistance: Maximum assistance Feeding assistance: Limited assistance Dressing Assistance: Maximum assistance     Functional Limitations Info  Sight, Hearing, Speech Sight Info: Adequate Hearing Info: Adequate Speech Info: Adequate    SPECIAL CARE FACTORS FREQUENCY  PT (By  licensed PT), OT (By licensed OT)     PT Frequency: 5 OT Frequency: 5            Contractures Contractures Info: Not present    Additional Factors Info  Code Status, Allergies Code Status Info: Full code Allergies Info: Other Psychotropic Info: Xanax         Current Medications (09/19/2015):  This is the current hospital active medication list Current Facility-Administered Medications  Medication Dose Route Frequency Provider Last Rate Last Dose  . 0.9 %  sodium chloride infusion   Intravenous Continuous Carole Civil, MD 75 mL/hr at 09/19/15 [phone]    . acetaminophen (OFIRMEV) IV 1,000 mg  1,000 mg Intravenous 4 times per day Carole Civil, MD   1,000 mg at 09/19/15 0000  . albuterol (PROVENTIL) (2.5 MG/3ML) 0.083% nebulizer solution 2.5 mg  2.5 mg Nebulization Q6H PRN Kathie Dike, MD      . ALPRAZolam Duanne Moron) tablet 0.25 mg  0.25 mg Oral BID PRN Kathie Dike, MD   0.25 mg at 09/18/15 2253  . aspirin EC tablet 325 mg  325 mg Oral Q breakfast Carole Civil, MD      . HYDROcodone-acetaminophen (NORCO/VICODIN) 5-325 MG per tablet 1-2 tablet  1-2 tablet Oral Q6H PRN Kathie Dike, MD   1 tablet at 09/18/15 2253  . magnesium citrate solution 1 Bottle  1 Bottle Oral Once PRN Carole Civil, MD      . menthol-cetylpyridinium (CEPACOL) lozenge 3 mg  1 lozenge Oral PRN Carole Civil, MD  Or  . phenol (CHLORASEPTIC) mouth spray 1 spray  1 spray Mouth/Throat PRN Carole Civil, MD      . metoCLOPramide (REGLAN) tablet 5-10 mg  5-10 mg Oral Q8H PRN Carole Civil, MD       Or  . metoCLOPramide (REGLAN) injection 5-10 mg  5-10 mg Intravenous Q8H PRN Carole Civil, MD      . nicotine (NICODERM CQ - dosed in mg/24 hours) patch 21 mg  21 mg Transdermal Daily Kathie Dike, MD   21 mg at 09/17/15 0944  . ondansetron (ZOFRAN) tablet 4 mg  4 mg Oral Q6H PRN Carole Civil, MD       Or  . ondansetron [facility]) injection 4 mg  4 mg Intravenous Q6H  PRN Carole Civil, MD   4 mg at 09/19/15 0918  . pantoprazole (PROTONIX) EC tablet 40 mg  40 mg Oral Daily Kathie Dike, MD   40 mg at 09/17/15 0946  . polyethylene glycol (MIRALAX / GLYCOLAX) packet 17 g  17 g Oral Daily PRN Kathie Dike, MD      . promethazine (PHENERGAN) injection 6.25 mg  6.25 mg Intravenous Q6H PRN Kathie Dike, MD   6.25 mg at 09/18/15 0645  . senna (SENOKOT) tablet 8.6 mg  1 tablet Oral BID Carole Civil, MD   8.6 mg at 09/18/15 2253  . sorbitol 70 % solution 30 mL  30 mL Oral Daily PRN Carole Civil, MD      . traMADol Veatrice Bourbon) tablet 50 mg  50 mg Oral 4 times per day Carole Civil, MD   50 mg at 09/19/15 [id number]     Discharge Medications: Please see discharge summary for a list of discharge medications.  Relevant Imaging Results:  Relevant Lab Results:   Additional Information SS#: [id number]  Salome Arnt, [place]

## 2015-09-19 NOTE — Plan of Care (Signed)
Problem: Acute Rehab PT Goals(only PT should resolve) Goal: Patient Will Transfer Sit To/From Stand Pt will transfer sit to/from-stand with RW at [place] without loss-of-balance to demonstrate good safety awareness for independent mobility in home.     Goal: Pt Will Ambulate Pt will ambulate with RW at [place] using a step-through pattern and equal step length for a distances greater than 15ft to demonstrate the ability to perform safe household distance ambulation at discharge.    Goal: Pt Will Go Up/Down Stairs Pt will ascend/descend 5 stairs with LRAD and 1 HR at [place] to demonstrate safe entry/exit of home.

## 2015-09-19 NOTE — Clinical Social Work Placement (Signed)
   CLINICAL SOCIAL WORK PLACEMENT  NOTE  Date:  09/19/2015  Patient Details  Name: Taylor Cannon MRN: [id number] Date of Birth: 10-07-1933  Clinical Social Work is seeking post-discharge placement for this patient at the [place] level of care (*CSW will initial, date and re-position this form in  chart as items are completed):  Yes   Patient/family provided with [place] Work Department's list of facilities offering this level of care within the geographic area requested by the patient (or if unable, by the patient's family).  Yes   Patient/family informed of their freedom to choose among providers that offer the needed level of care, that participate in Medicare, Medicaid or managed care program needed by the patient, have an available bed and are willing to accept the patient.  Yes   Patient/family informed of [place]'s ownership interest in [facility] and [facility], as well as of the fact that they are under no obligation to receive care at these facilities.  PASRR submitted to EDS on       PASRR number received on       Existing PASRR number confirmed on       FL2 transmitted to all facilities in geographic area requested by pt/family on 09/19/15     FL2 transmitted to all facilities within larger geographic area on       Patient informed that his/her managed care company has contracts with or will negotiate with certain facilities, including the following:            Patient/family informed of bed offers received.  Patient chooses bed at       Physician recommends and patient chooses bed at      Patient to be transferred to   on  .  Patient to be transferred to facility by       Patient family notified on   of transfer.  Name of family member notified:        PHYSICIAN       Additional Comment:  Pasarr not needed per [place] facilities.   _______________________________________________ Taylor Cannon, [place] 09/19/2015, 9:28 AM [phone]

## 2015-09-19 NOTE — Clinical Social Work Placement (Signed)
   CLINICAL SOCIAL WORK PLACEMENT  NOTE  Date:  09/19/2015  Patient Details  Name: Taylor Cannon MRN: [id number] Date of Birth: 1934/03/05  Clinical Social Work is seeking post-discharge placement for this patient at the [place] level of care (*CSW will initial, date and re-position this form in  chart as items are completed):  Yes   Patient/family provided with [place] Work Department's list of facilities offering this level of care within the geographic area requested by the patient (or if unable, by the patient's family).  Yes   Patient/family informed of their freedom to choose among providers that offer the needed level of care, that participate in Medicare, Medicaid or managed care program needed by the patient, have an available bed and are willing to accept the patient.  Yes   Patient/family informed of [place]'s ownership interest in [facility] and [facility], as well as of the fact that they are under no obligation to receive care at these facilities.  PASRR submitted to EDS on       PASRR number received on       Existing PASRR number confirmed on       FL2 transmitted to all facilities in geographic area requested by pt/family on 09/19/15     FL2 transmitted to all facilities within larger geographic area on       Patient informed that his/her managed care company has contracts with or will negotiate with certain facilities, including the following:        Yes   Patient/family informed of bed offers received.  Patient [facility] bed at [facility]     Physician recommends and patient chooses bed at      Patient to be transferred to [facility] on  .  Patient to be transferred to facility by       Patient family notified on   of transfer.  Name of family member notified:        PHYSICIAN       Additional Comment:  No pasarr needed per [place] facilities. [facility] will  start [employer].   _______________________________________________ Salome Arnt, [place] 09/19/2015, 10:32 AM [phone]

## 2015-09-19 NOTE — Anesthesia Postprocedure Evaluation (Signed)
Anesthesia Post Note  Patient: Taylor Cannon  Procedure(s) Performed: Procedure(s) (LRB): INTRAMEDULLARY (IM) NAIL INTERTROCHANTRIC (Left)  Patient location during evaluation: Nursing Unit Anesthesia Type: Spinal Level of consciousness: awake and alert, oriented and patient cooperative Pain management: pain level controlled Vital Signs Assessment: post-procedure vital signs reviewed and stable Respiratory status: spontaneous breathing Cardiovascular status: blood pressure returned to baseline and stable Postop Assessment: no headache, no backache, no signs of nausea or vomiting and adequate PO intake Anesthetic complications: no    Last Vitals:  Filed Vitals:   09/18/15 2209 09/19/15 0615  BP: 126/72 125/61  Pulse: 88 76  Temp: 36.6 C 36.6 C  Resp: 16 16    Last Pain:  Filed Vitals:   09/19/15 1135  PainSc: 10-Worst pain ever                 Trayonna Bachmeier J

## 2015-09-19 NOTE — Progress Notes (Signed)
[facility] PROGRESS NOTE  Taylor Cannon [id number] DOB: 01-29-1934 DOA: 09/16/2015 PCP: Elyn Aquas  Assessment/Plan: 1. Displaced intertrochanteric fracture of the left hip. Status post ORIF of left hip on 1/15. Pain well-controlled with current management. Discharge to [facility] 1/17. 2. Mechanical fall, patient denied any predisposing symptoms. Reported that she hit her head but CT head was negative for acute findings.  3. GERD, continue PPI. 4. COPD, stable. There is no evidence of wheezing. Continue bronchodilators and pulmonary hygiene. Encourage incentive spirometry. 5. Tobacco abuse, consulted on the importance of cessation.Continue Nicotine patch. 6. Severe malnutrition in the context of chronic illness, underweight. Nutrition following.    Code Status: Full DVT prophylaxis: SCDs Family Communication: Friend bedside. Discussed with patient who understands and has no concerns at this time. Disposition Plan: Anticipate discharge to [facility] 09/19/14.   Consultants:  Orthopedics- Dr. Aline Brochure   Procedures:  Intramedullary nail intertrochanteric 1/15   Antibiotics:  None  HPI/Subjective: Mild hip numbness and pain otherwise feels ok. No reports of chest pain or sob.   Objective: Filed Vitals:   09/18/15 2209 09/19/15 0615  BP: 126/72 125/61  Pulse: 88 76  Temp: 97.9 F (36.6 C) 97.9 F (36.6 C)  Resp: 16 16    Intake/Output Summary (Last 24 hours) at 09/19/15 0813 Last data filed at 09/19/15 0616  Gross per 24 hour  Intake   1940 ml  Output    950 ml  Net    990 ml   Filed Weights   09/16/15 0919 09/16/15 1415  Weight: 48.535 kg (107 lb) 48.535 kg (107 lb)    Exam: General: NAD. Sitting in room chair and appears calm and comfortable.  Cardiovascular: Regular rate and rhythm  Respiratory: CTAB.  Abdomen: soft, non tender, no distention Musculoskeletal: No edema b/l  Data Reviewed: Basic Metabolic Panel:  Recent Labs Lab 09/16/15 0943  09/17/15 0608 09/18/15 0705 09/19/15 0548  NA 135 137 135 133*  K 3.7 3.7 4.7 4.4  CL 97* 99* 98* 98*  CO2 31 32 31 30  GLUCOSE 102* 87 113* 79  BUN 9 12 9 11   CREATININE 0.68 0.73 0.68 0.72  CALCIUM 9.0 8.4* 8.9 8.0*  CBC:  Recent Labs Lab 09/16/15 0943 09/17/15 0608 09/18/15 0705 09/19/15 0548  WBC 9.6 7.2 10.1 9.5  NEUTROABS 8.4*  --   --   --   HGB 14.3 12.5 13.4 11.2*  HCT 46.1* 40.8 44.9 36.4  MCV 72.9* 74.6* 74.8* 75.4*  PLT 508* 457* 464* 446*     Studies: Dg Hip Operative Unilat With Pelvis Left  09/18/2015  CLINICAL DATA:  Intertrochanteric fracture LEFT hip EXAM: OPERATIVE LEFT HIP (WITH PELVIS IF PERFORMED) 6 VIEWS TECHNIQUE: Fluoroscopic spot image(s) were submitted for interpretation post-operatively. COMPARISON:  Radiographs 09/16/2015 FLUOROSCOPY TIME:  1 minutes 43 seconds Images obtained: 6 FINDINGS: Osseous demineralization. Images demonstrate reduction of previously identified intertrochanteric fracture LEFT femur. IM nail with distal locking screw placed. Proximal compression screw placed into the LEFT femoral head. No additional fracture or dislocation. IMPRESSION: Post ORIF of a reduced intertrochanteric fracture LEFT femur. Electronically Signed   By: Lavonia Dana M.D.   On: 09/18/2015 12:32    Scheduled Meds: . acetaminophen  1,000 mg Intravenous 4 times per day  . aspirin EC  325 mg Oral Q breakfast  . nicotine  21 mg Transdermal Daily  . pantoprazole  40 mg Oral Daily  . senna  1 tablet Oral BID  . traMADol  50 mg  Oral 4 times per day   Continuous Infusions: . sodium chloride 75 mL/hr at 09/19/15 [id number]    Principal Problem:   Closed left hip fracture (HCC) Active Problems:   COPD (chronic obstructive pulmonary disease) (HCC)   Tobacco abuse   GERD (gastroesophageal reflux disease)   Fall   Protein-calorie malnutrition, severe   Closed fracture of intertrochanteric section of femur ([place])   Intertrochanteric fracture of left femur  ([place])    Time spent: 25 minutes    Glendon Fiser. MD  [facility] Pager [phone]. If 7PM-7AM, please contact night-coverage at [email or web address], password [facility] 09/19/2015, 8:13 AM  LOS: 3 days     . By signing my name below, I, Rennis Harding, attest that this documentation has been prepared under the direction and in the presence of Kathie Dike, MD. Electronically signed: Rennis Harding, Scribe. 09/19/2015 11:00AM   I, Dr. Kathie Dike, personally performed the services described in this documentaiton. All medical record entries made by the scribe were at my direction and in my presence. I have reviewed the chart and agree that the record reflects my personal performance and is accurate and complete  Kathie Dike, MD, 09/19/2015 11:18 AM

## 2015-09-19 NOTE — Progress Notes (Signed)
Follow- up  DOCUMENTATION CODES:   Severe malnutrition in context of chronic illness, Underweight  INTERVENTION:  -Ensure Enlive po BID, each supplement provides 350 kcal and 20 grams of protein  - MVI daily -RD will continue to follow for nutrition care  NUTRITION DIAGNOSIS:   Malnutrition related to poor appetite, social / environmental circumstances, dysphagia as evidenced by severe depletion of body fat, severe depletion of muscle mass.  GOAL:   Patient will meet greater than or equal to 90% of their needs, Weight gain  MONITOR:   Diet advancement, PO intake, Supplement acceptance, Weight trends, Labs  REASON FOR ASSESSMENT:   Consult Assessment of nutrition requirement/status  ASSESSMENT:  Pt is pleasant 82 yo female with hx of COPD, GERD, multiple esophageal dilations due to stricture,  ETOH abuse, Smoker (55 years).  She is s/p left hip fx and open treatment internal fixation. Her diet is advanced and intake today 25-50%. She has decided [facility] to be near her children. We talked about the important role of nutrition (protein, energy and variety of vegetables and fruits) to promote healing and  work in conjunction with her therapy. Encouraged her to continue the MVI and  Ensure Enlive after d/c for repletion of nutrients.  Diet Order:  Diet regular Room service appropriate?: Yes; Fluid consistency:: Thin  Skin:   intact  Last BM:  1/12  Height:   Ht Readings from Last 1 Encounters:  09/16/15 5\' 8"  (1.727 m)    Weight:   Wt Readings from Last 1 Encounters:  09/16/15 107 lb (48.535 kg)    Ideal Body Weight:  63.6 kg  BMI:  Body mass index is 16.27 kg/(m^2).  Estimated Nutritional Needs:   Kcal:  [id number]   Protein:  59-73 gr  Fluid:  1.5 liters daily  EDUCATION NEEDS:   No education needs identified at this time   Colman Cater MS,RD,CSG,LDN Office: [phone] Pager: [phone]

## 2015-09-19 NOTE — Anesthesia Procedure Notes (Signed)
Spinal Patient location during procedure: OR Start time: 09/18/2015 10:31 AM Staffing Resident/CRNA: Bernette Redbird J Preanesthetic Checklist Completed: patient identified, site marked, surgical consent, pre-op evaluation, timeout performed, IV checked, risks and benefits discussed and monitors and equipment checked Spinal Block Patient position: left lateral decubitus Prep: Betadine Patient monitoring: heart rate, cardiac monitor, continuous pulse ox and blood pressure Approach: left paramedian Location: L3-4 Injection technique: single-shot Needle Needle type: Spinocan  Needle gauge: 22 G Assessment Sensory level: T8 Additional Notes CSF clear and brisk

## 2015-09-19 NOTE — Progress Notes (Signed)
IV Acetaminophen not given due to recommended 24hr cumulative dose was over the limit.  Concurred with pharmacist to verify dosages and the amt was under the recommended dose.  The med is being administered.  Md  Notified.

## 2015-09-19 NOTE — Anesthesia Postprocedure Evaluation (Signed)
Anesthesia Post Note  Patient: Taylor Cannon  Procedure(s) Performed: Procedure(s) (LRB): INTRAMEDULLARY (IM) NAIL INTERTROCHANTRIC (Left)  Anesthesia Post Evaluation  Last Vitals:  Filed Vitals:   09/18/15 2209 09/19/15 0615  BP: 126/72 125/61  Pulse: 88 76  Temp: 36.6 C 36.6 C  Resp: 16 16    Last Pain:  Filed Vitals:   09/19/15 0650  PainSc: 2                  Shenaya Lebo J

## 2015-09-19 NOTE — Evaluation (Signed)
Occupational Therapy Evaluation Patient Details Name: Taylor Cannon MRN: [id number] DOB: Jan 29, 1934 Today's Date: 09/19/2015    History of Present Illness 82 y.o. female with a hx of COPD, GERD, and a TIA that presented to the ED s/p a mechanical fall.  Displaced intertrochanteric fracture of the left hip. Status post ORIF of left hip on 1/15.   Clinical Impression   Patient agreed to participate in OT evaluation. Requires increased assistance with basic ADL tasks and functional transfers. Recommend D/C to SNF to increase functional transfers and ADL tasks to allow for a safe return home at highest level of independence.     Follow Up Recommendations  SNF    Equipment Recommendations  None recommended by OT    Recommendations for Other Services PT consult     Precautions / Restrictions Precautions Precautions: Fall Restrictions Weight Bearing Restrictions: No LUE Weight Bearing: Weight bearing as tolerated      Mobility Bed Mobility Overal bed mobility: Needs Assistance Bed Mobility: Supine to Sit     Supine to sit: HOB elevated;Mod assist; increased time needed to complete tasks.        Transfers Overall transfer level: Needs assistance Equipment used: Rolling walker (2 wheeled) Transfers: Sit to/from Stand Sit to Stand: Min assist                   ADL Overall ADL's : Needs assistance/impaired Eating/Feeding: Independent;Bed level                   Lower Body Dressing: Total assistance;Bed level Lower Body Dressing Details (indicate cue type and reason): Due to recent surgery             Functional mobility during ADLs: Minimal assistance;Rolling walker;Cueing for safety;Cueing for sequencing; increased time needed to complete task.                 Pertinent Vitals/Pain Pain Assessment:  (No number given. Pt reports pain with movement and weightbearing.)     Hand Dominance Right   Extremity/Trunk Assessment Upper Extremity  Assessment Upper Extremity Assessment: Overall WFL for tasks assessed   Lower Extremity Assessment Lower Extremity Assessment: Defer to PT evaluation       Communication Communication Communication: No difficulties   Cognition Arousal/Alertness: Awake/alert Behavior During Therapy: WFL for tasks assessed/performed Overall Cognitive Status: Within Functional Limits for tasks assessed                                Home Living Family/patient expects to be discharged to:: Skilled nursing facility Living Arrangements: Alone                                      Prior Functioning/Environment Level of Independence: Independent        Comments: Patient reports that she completed all ADL and IADL tasks independently including driving. Patient wears oxygen at night only.     OT Diagnosis: Generalized weakness   OT Problem List: Decreased strength;Pain;Decreased activity tolerance;Impaired balance (sitting and/or standing) (decreased ADL)                       End of Session Equipment Utilized During Treatment: Gait belt;Rolling walker Nurse Communication: Mobility status;Patient requests pain meds  Activity Tolerance: Patient tolerated treatment well Patient left: in chair;with call bell/phone within  reach;with family/visitor present   Time: 0830-0910 OT Time Calculation (min): 40 min Charges:  OT General Charges $OT Visit: 1 Procedure OT Evaluation $OT Eval Low Complexity: 1 Procedure G-Codes:    Ailene Ravel, OTR/L,CBIS  [phone]  09/19/2015, 9:18 AM

## 2015-09-19 NOTE — Evaluation (Signed)
Physical Therapy Evaluation Patient Details Name: Taylor Cannon MRN: [id number] DOB: [birth year] Today's Date: 09/19/2015   History of Present Illness  82 y.o. female with a hx of COPD, GERD, and a TIA that presented to the ED s/p a mechanical fall.  Displaced intertrochanteric fracture of the left hip. Status post ORIF of left hip on 1/15. At baseline, pt is fully indep in ADL, IADL, still driiving, llives alone with 5 steps to enter mobile. Pt reports some intermittent vertiginous episodes in the past several years, but nothing recent. Pt is presenting on POD1.   Clinical Impression  Pt demonstrating global weakness, impaired LLE strength, acute post surgical pain, decreased mobility, and decreased balance. Pt tolerates HEP education and therex well, but not strong enough to perform indep. Of note: L knee is demonstrating some pain-free lateral joint laxity consistent with valgus deformity. Pt will benefit from skilled PT intervention to improve the deficits outlined in this note, and to restore functional indep in ADL, IADL, and community distance ambulation. Recommending DC to SNF when medically ready.     Follow Up Recommendations SNF    Equipment Recommendations  None recommended by PT    Recommendations for Other Services       Precautions / Restrictions Precautions Precautions: None Restrictions Weight Bearing Restrictions: Yes LUE Weight Bearing: Weight bearing as tolerated      Mobility  Bed Mobility Overal bed mobility: Needs Assistance Bed Mobility: Supine to Sit     Supine to sit: HOB elevated;Max assist     General bed mobility comments: assistanc with bilat LE getting into bed, and LLE exiting bed; requires help with trunk.   Transfers Overall transfer level: Needs assistance Equipment used: Rolling walker (2 wheeled) Transfers: Sit to/from Stand Sit to Stand: Min assist         General transfer comment: very determined.    Ambulation/Gait Ambulation/Gait assistance: Min guard Ambulation Distance (Feet): 28 Feet Assistive device: Rolling walker (2 wheeled) Gait Pattern/deviations: Step-through pattern   Gait velocity interpretation: <1.8 ft/sec, indicative of risk for recurrent falls General Gait Details: good use of surgical leg in swing phase, but limited in weight bearing for R step. Slow, steady and safe. Pt reports lightheadedness throughout.   Stairs            Wheelchair Mobility    Modified Rankin (Stroke Patients Only)       Balance Overall balance assessment: Modified Independent;No apparent balance deficits (not formally assessed)                                           Pertinent Vitals/Pain Pain Assessment: 0-10 Pain Score: 8  Pain Location: L hip- 5 at rest, 8 with walking.  Pain Intervention(s): Limited activity within patient's tolerance;Monitored during session;Repositioned;Patient requesting pain meds-RN notified    Home Living Family/patient expects to be discharged to:: Skilled nursing facility Living Arrangements: Alone               Additional Comments: Pt previously indep in all ADL, IADL, still driving. Lives alone.     Prior Function Level of Independence: Independent         Comments: Patient reports that she completed all ADL and IADL tasks independently including driving. Patient wears oxygen at night only.      Hand Dominance   Dominant Hand: Right    Extremity/Trunk Assessment  Upper Extremity Assessment: Generalized weakness (difficulty pulling self up in bed; fatigue in lats and pecs during RW use, unable to stabilize the scapulae during RW use. )           Lower Extremity Assessment: Generalized weakness      Cervical / Trunk Assessment: Kyphotic (mild kyphosis/scoliosis. )  Communication   Communication: No difficulties  Cognition Arousal/Alertness: Awake/alert Behavior During Therapy: WFL for tasks  assessed/performed Overall Cognitive Status: Within Functional Limits for tasks assessed                      General Comments      Exercises Other Exercises Other Exercises: LLE abduction heel slides x10 c mod Assist Other Exercises: LLE heel slides x10 c mod assist Other Exercises: LLE SAQ x10 with max assist Other Exercises: Bridging inittiation in bed  knees at 80 degrees x10.       Assessment/Plan    PT Assessment Patient needs continued PT services  PT Diagnosis Difficulty walking;Abnormality of gait;Generalized weakness;Acute pain   PT Problem List Decreased strength;Decreased range of motion;Decreased activity tolerance;Decreased mobility  PT Treatment Interventions Gait training;DME instruction;Stair training;Functional mobility training;Therapeutic activities;Therapeutic exercise;Patient/family education   PT Goals (Current goals can be found in the Care Plan section) Acute Rehab PT Goals Patient Stated Goal: regain independence  PT Goal Formulation: With patient Time For Goal Achievement: 10/03/15 Potential to Achieve Goals: Fair    Frequency BID   Barriers to discharge Inaccessible home environment;Decreased caregiver support      Co-evaluation               End of Session Equipment Utilized During Treatment: Gait belt Activity Tolerance: Patient tolerated treatment well;Patient limited by pain;Other (comment) (worsening nausea with activity. ) Patient left: in bed;with call bell/phone within reach;with family/visitor present (pt has already been to chair tday with nursing. ) Nurse Communication: Mobility status;Other (comment)         Time: [id number] PT Time Calculation (min) (ACUTE ONLY): 44 min   Charges:   PT Evaluation $PT Eval Low Complexity: 1 Procedure PT Treatments $Gait Training: 8-22 mins $Therapeutic Exercise: 8-22 mins   PT G Codes:        Buccola,Allan C 2015/10/19, 4:10 PM 4:14 PM  Etta Grandchild, PT, DPT   License # [id number]

## 2015-09-19 NOTE — Progress Notes (Signed)
Subjective: 1 Day Post-Op Procedure(s) (LRB): INTRAMEDULLARY (IM) NAIL INTERTROCHANTRIC (Left) Patient reports pain as mild.    Objective: Vital signs in last 24 hours: Temp:  [97.4 F (36.3 C)-97.9 F (36.6 C)] 97.9 F (36.6 C) (01/16 0615) Pulse Rate:  [76-92] 76 (01/16 0615) Resp:  [8-16] 16 (01/16 0615) BP: (98-133)/(50-77) 125/61 mmHg (01/16 0615) SpO2:  [93 %-100 %] 100 % (01/16 0615)  Intake/Output from previous day: 01/15 0701 - 01/16 0700 In: 1940 [P.O.:240; I.V.:1550; IV Piggyback:150] Out: 950 [Urine:900; Blood:50] Intake/Output this shift:     Recent Labs  09/16/15 0943 09/17/15 0608 09/18/15 0705 09/19/15 0548  HGB 14.3 12.5 13.4 11.2*    Recent Labs  09/18/15 0705 09/19/15 0548  WBC 10.1 9.5  RBC 6.00* 4.83  HCT 44.9 36.4  PLT 464* 446*    Recent Labs  09/17/15 0608 09/18/15 0705  NA 137 135  K 3.7 4.7  CL 99* 98*  CO2 32 31  BUN 12 9  CREATININE 0.73 0.68  GLUCOSE 87 113*  CALCIUM 8.4* 8.9    Recent Labs  09/16/15 0943  INR 1.32    Neurologically intact Neurovascular intact Sensation intact distally Intact pulses distally Dorsiflexion/Plantar flexion intact Incision: scant drainage Compartment soft  Assessment/Plan: 1 Day Post-Op Procedure(s) (LRB): INTRAMEDULLARY (IM) NAIL INTERTROCHANTRIC (Left) Advance diet Up with therapy D/C IV fluids Discharge to SNF TOMORROW  Hip fracture instructions for Gamma Type nail   Procedure open treatment internal fixation with gamma nail / long  Weightbearing status as tolerated  Staples removed postop day #  12  Followup visit. Postop day # 28th  X-rays please have x-rays performed. Postop day # 28  DVT prophylaxis x 28 days  Taylor Cannon 09/19/2015, 7:29 AM

## 2015-09-20 ENCOUNTER — Encounter (HOSPITAL_COMMUNITY): Payer: Self-pay

## 2015-09-20 DIAGNOSIS — S72002D Fracture of unspecified part of neck of left femur, subsequent encounter for closed fracture with routine healing: Secondary | ICD-10-CM

## 2015-09-20 LAB — BASIC METABOLIC PANEL
Anion gap: 5 (ref 5–15)
BUN: 12 mg/dL (ref 6–20)
CO2: 30 mmol/L (ref 22–32)
Calcium: 8.3 mg/dL — ABNORMAL LOW (ref 8.9–10.3)
Chloride: 100 mmol/L — ABNORMAL LOW (ref 101–111)
Creatinine, Ser: 0.65 mg/dL (ref 0.44–1.00)
GFR calc Af Amer: >60 mL/min (ref >60)
GFR calc non Af Amer: >60 mL/min (ref >60)
Glucose, Bld: 81 mg/dL (ref 65–99)
Potassium: 4.7 mmol/L (ref 3.5–5.1)
Sodium: 135 mmol/L (ref 135–145)

## 2015-09-20 LAB — CBC
HCT: 36.7 % (ref 36.0–46.0)
Hemoglobin: 11 g/dL — ABNORMAL LOW (ref 12.0–15.0)
MCH: 22.8 pg — ABNORMAL LOW (ref 26.0–34.0)
MCHC: 30 g/dL (ref 30.0–36.0)
MCV: 76.1 fL — ABNORMAL LOW (ref 78.0–100.0)
Platelets: 457 10*3/uL — ABNORMAL HIGH (ref 150–400)
RBC: 4.82 MIL/uL (ref 3.87–5.11)
RDW: 18.7 % — ABNORMAL HIGH (ref 11.5–15.5)
WBC: 6.9 10*3/uL (ref 4.0–10.5)

## 2015-09-20 MED ORDER — OXYCODONE HCL 5 MG PO TABS: 5.0000 mg | ORAL_TABLET | Freq: Four times a day (QID) | ORAL | Status: DC | PRN

## 2015-09-20 MED ORDER — ASPIRIN 325 MG PO TBEC: 325.0000 mg | DELAYED_RELEASE_TABLET | Freq: Every day | ORAL | Status: DC

## 2015-09-20 MED ORDER — ALPRAZOLAM 0.25 MG PO TABS: 0.2500 mg | ORAL_TABLET | Freq: Two times a day (BID) | ORAL | Status: DC | PRN

## 2015-09-20 MED ORDER — ALBUTEROL SULFATE (2.5 MG/3ML) 0.083% IN NEBU: 2.5000 mg | INHALATION_SOLUTION | Freq: Four times a day (QID) | RESPIRATORY_TRACT | Status: DC | PRN

## 2015-09-20 NOTE — Discharge Summary (Signed)
Physician Discharge Summary  Taylor Cannon [id number] DOB: [birth year] DOA: 09/16/2015  PCP: Elyn Aquas  Admit date: 09/16/2015 Discharge date: 09/20/2015  Time spent: 35 minutes  Recommendations for Outpatient Follow-up:  1. Follow up with Orthopedics in four weeks for resolution of operation and Xrays.  2. Remove staples 1/27 and apply steri-strips. 3. Weight bearing as tolerated  4. Discharge to SNF   Discharge Diagnoses:  Principal Problem:   Closed left hip fracture ([place]) Active Problems:   COPD (chronic obstructive pulmonary disease) ([facility])   Tobacco abuse   GERD (gastroesophageal reflux disease)   Fall   Protein-calorie malnutrition, severe   Closed fracture of intertrochanteric section of femur ([place])   Intertrochanteric fracture of left femur [facility])   Discharge Condition: Improved   Diet recommendation: Heart healthy   Filed Weights   09/16/15 0919 09/16/15 1415  Weight: 48.535 kg (107 lb) 48.535 kg (107 lb)    History of present illness:  82 y.o. female with a hx of COPD, GERD, and a TIA that presented to the ED s/p a mechanical fall that occurred this morning. Patient states her foot got caught in her oxygen tubing and she fell onto her left side. She admits to hitting her head but denies any LOC. After her fall, she was unable to ambulate but managed to drag herself to a phone to call for help. She currently complains of pain in her left hip that is worse with movement. She denies any fever, chills, CP, SOB, visual changes, dizziness, numbness, or tinging. Patient typically ambulates without assistance but has been unable to ambulate since her fall. She wears her O2 QHS.  While in the ED, vital signs were stable. Labs were relatively unremarkable. Head CT and CXR were negative for any acute process. Xray of the hip revealed a displaced intertrochanteric fracture of the left hip. She will be admitted for further management.   Hospital Course:  1. Displaced  intertrochanteric fracture of the left hip. Status post ORIF of left hip on 1/15. Pain well-controlled with current management.  2. Mechanical fall, patient denied any predisposing symptoms. Reported that she hit her head but CT head was negative for acute findings.  3. GERD, continue PPI. 4. COPD, stable. There was no evidence of wheezing. Continue bronchodilators and pulmonary hygiene. Encouraged incentive spirometry. 5. Tobacco abuse, consulted on the importance of cessation.Provided Nicotine patch. 6. Severe malnutrition in the context of chronic illness, underweight. Nutrition followed.  Procedures:  Orthopedics   Consultations:  Intramedullary nail intertrochanteric 1/15  Discharge Exam: Filed Vitals:   09/20/15 0000 09/20/15 0400  BP: 132/75 133/66  Pulse:  109  Temp: 99.4 F (37.4 C) 98.1 F (36.7 C)  Resp: 20 19     General: NAD, looks comfortable  Cardiovascular: RRR, S1, S2   Respiratory: clear bilaterally, No wheezing, rales or rhonchi  Abdomen: soft, non tender, no distention , bowel sounds normal  Musculoskeletal: No edema b/l  Discharge Instructions   Discharge Instructions    Diet - low sodium heart healthy    Complete by:  As directed      Increase activity slowly    Complete by:  As directed           Current Discharge Medication List    START taking these medications   Details  albuterol (PROVENTIL) (2.5 MG/3ML) 0.083% nebulizer solution Take 3 mLs (2.5 mg total) by nebulization every 6 (six) hours as needed for wheezing or shortness of breath. Qty: 75 mL,  Refills: 12    aspirin EC 325 MG EC tablet Take 1 tablet (325 mg total) by mouth daily with breakfast. Qty: 30 tablet, Refills: 0    oxyCODONE (OXY IR/ROXICODONE) 5 MG immediate release tablet Take 1-2 tablets (5-10 mg total) by mouth every 6 (six) hours as needed for moderate pain. Qty: 30 tablet, Refills: 0      CONTINUE these medications which have CHANGED   Details   ALPRAZolam (XANAX) 0.25 MG tablet Take 1 tablet (0.25 mg total) by mouth 2 (two) times daily as needed for anxiety. Qty: 30 tablet, Refills: 0      CONTINUE these medications which have NOT CHANGED   Details  calcium-vitamin D (OSCAL WITH D) 500-200 MG-UNIT per tablet Take 1 tablet by mouth daily with breakfast.    Multiple Vitamins-Minerals (MULTIVITAMINS THER. W/MINERALS) TABS tablet Take 1 tablet by mouth daily.    omeprazole (PRILOSEC) 20 MG capsule Take 20 mg by mouth daily.    vitamin C (ASCORBIC ACID) 500 MG tablet Take 500 mg by mouth daily.      STOP taking these medications     aspirin 81 MG tablet        Allergies  Allergen Reactions  . Other Nausea Only    Steroids   Follow-up Information    Follow up with Rodrigo Ran. Call on 09/27/2015.   Why:  to 11:00 am   Contact information:   [address] [place] [address] [phone]       The results of significant diagnostics from this hospitalization (including imaging, microbiology, ancillary and laboratory) are listed below for reference.    Significant Diagnostic Studies: Dg Chest 1 View  09/16/2015  CLINICAL DATA:  [place].  Left hip fracture. EXAM: CHEST 1 VIEW COMPARISON:  None available. FINDINGS: The cardiac silhouette, mediastinal and hilar contours are within normal limits. There is tortuosity, ectasia and calcification of the thoracic aorta. Suspect a moderate-sized hiatal hernia. There are emphysematous changes and pulmonary scarring but no acute overlying pulmonary process. The bony thorax is intact. IMPRESSION: Emphysematous changes and pulmonary scarring but no acute overlying pulmonary process. Electronically Signed   By: Marijo Sanes M.D.   On: 09/16/2015 10:26   Ct Head Wo Contrast  09/16/2015  CLINICAL DATA:  Mechanical fall. Patient caught her foot and fell to her left hip earlier this morning. Patient hit her head when she fell. No loss of consciousness. History of TIA. EXAM: CT HEAD  WITHOUT CONTRAST TECHNIQUE: Contiguous axial images were obtained from the base of the skull through the vertex without intravenous contrast. COMPARISON:  None. FINDINGS: There is central and cortical atrophy. Periventricular white matter changes are consistent with small vessel disease. There is no intra or extra-axial fluid collection or mass lesion. The basilar cisterns and ventricles have a normal appearance. There is no CT evidence for acute infarction or hemorrhage. Bone windows are unremarkable. IMPRESSION: 1. Atrophy and small vessel disease. 2.  No evidence for acute intracranial abnormality. Electronically Signed   By: Nolon Nations M.D.   On: 09/16/2015 10:43   Dg Hip Operative Unilat With Pelvis Left  09/18/2015  CLINICAL DATA:  Intertrochanteric fracture LEFT hip EXAM: OPERATIVE LEFT HIP (WITH PELVIS IF PERFORMED) 6 VIEWS TECHNIQUE: Fluoroscopic spot image(s) were submitted for interpretation post-operatively. COMPARISON:  Radiographs 09/16/2015 FLUOROSCOPY TIME:  1 minutes 43 seconds Images obtained: 6 FINDINGS: Osseous demineralization. Images demonstrate reduction of previously identified intertrochanteric fracture LEFT femur. IM nail with distal locking screw placed.  Proximal compression screw placed into the LEFT femoral head. No additional fracture or dislocation. IMPRESSION: Post ORIF of a reduced intertrochanteric fracture LEFT femur. Electronically Signed   By: Lavonia Dana M.D.   On: 09/18/2015 12:32   Dg Hip Unilat With Pelvis 2-3 Views Left  09/16/2015  CLINICAL DATA:  [place].  Injured left hip. EXAM: DG HIP (WITH OR WITHOUT PELVIS) 2-3V LEFT COMPARISON:  None. FINDINGS: There is a displaced intertrochanteric fracture of the left hip. The right hip is normal. The pubic symphysis and SI joints are intact. No pelvic fractures. IMPRESSION: Displaced intertrochanteric fracture of the left hip Electronically Signed   By: Marijo Sanes M.D.   On: 09/16/2015 10:26    Microbiology: Recent  Results (from the past 240 hour(s))  Surgical pcr screen     Status: None   Collection Time: 09/17/15  5:25 PM  Result Value Ref Range Status   MRSA, PCR NEGATIVE NEGATIVE Final   Staphylococcus aureus NEGATIVE NEGATIVE Final    Comment:        The Xpert SA Assay (FDA approved for NASAL specimens in patients over 3 years of age), is one component of a comprehensive surveillance program.  Test performance has been validated by [facility] ([place] for patients greater than or equal to 75 year old. It is not intended to diagnose infection nor to guide or monitor treatment.      Labs: Basic Metabolic Panel:  Recent Labs Lab 09/16/15 0943 09/17/15 0608 09/18/15 0705 09/19/15 0548 09/20/15 0545  NA 135 137 135 133* 135  K 3.7 3.7 4.7 4.4 4.7  CL 97* 99* 98* 98* 100*  CO2 31 32 31 30 30   GLUCOSE 102* 87 113* 79 81  BUN 9 12 9 11 12   CREATININE 0.68 0.73 0.68 0.72 0.65  CALCIUM 9.0 8.4* 8.9 8.0* 8.3*   CBC:  Recent Labs Lab 09/16/15 0943 09/17/15 0608 09/18/15 0705 09/19/15 0548 09/20/15 0545  WBC 9.6 7.2 10.1 9.5 6.9  NEUTROABS 8.4*  --   --   --   --   HGB 14.3 12.5 13.4 11.2* 11.0*  HCT 46.1* 40.8 44.9 36.4 36.7  MCV 72.9* 74.6* 74.8* 75.4* 76.1*  PLT 508* 457* 464* 446* 457*    Signed:  Kathie Dike, MD  [facility] 09/20/2015, 10:43 AM    By signing my name below, I, Rennis Harding, attest that this documentation has been prepared under the direction and in the presence of Kathie Dike, MD. Electronically signed: Rennis Harding, Scribe. 09/20/2015 10:20am   I, Dr. Kathie Dike, personally performed the services described in this documentaiton. All medical record entries made by the scribe were at my direction and in my presence. I have reviewed the chart and agree that the record reflects my personal performance and is accurate and complete  Kathie Dike, MD, 09/20/2015 10:43 AM

## 2015-09-20 NOTE — [place] FL2 (Signed)
[place] MEDICAID FL2 LEVEL OF CARE SCREENING TOOL     IDENTIFICATION  Patient Name: Taylor Cannon Birthdate: 03/26/1934 Sex: female Admission Date (Current Location): 09/16/2015  [facility] and [place] Number:      Facility and Address:  [facility] [address], [place]      Provider Number: [id number]  Attending Physician Name and Address:  Kathie Dike, MD  Relative Name and Phone Number:       Current Level of Care: Hospital Recommended Level of Care: [place] Prior Approval Number:    Date Approved/Denied:   PASRR Number:    Discharge Plan: SNF    Current Diagnoses: Patient Active Problem List   Diagnosis Date Noted  . Intertrochanteric fracture of left femur ([place]) 09/18/2015  . Closed fracture of intertrochanteric section of femur ([place])   . Protein-calorie malnutrition, severe 09/17/2015  . Closed left hip fracture ([place]) 09/16/2015  . COPD (chronic obstructive pulmonary disease) ([place]) 09/16/2015  . Tobacco abuse 09/16/2015  . GERD (gastroesophageal reflux disease) 09/16/2015  . Fall 09/16/2015  . Dysphagia, pharyngoesophageal phase 10/05/2014  . Benign paroxysmal positional vertigo 10/05/2014    Orientation RESPIRATION BLADDER Height & Weight    Self, Time, Situation, Place  O2 (2L) Continent 5\' 8"  (172.7 cm) 107 lbs.  BEHAVIORAL SYMPTOMS/MOOD NEUROLOGICAL BOWEL NUTRITION STATUS  Other (Comment) (n/a)  (n/a) Continent Diet (Low sodium heart healthy)  AMBULATORY STATUS COMMUNICATION OF NEEDS Skin   Extensive Assist Verbally Surgical wounds                       Personal Care Assistance Level of Assistance  Bathing, Feeding, Dressing Bathing Assistance: Maximum assistance Feeding assistance: Limited assistance Dressing Assistance: Maximum assistance     Functional Limitations Info  Sight, Hearing, Speech Sight Info: Adequate Hearing Info: Adequate Speech Info: Adequate    SPECIAL CARE FACTORS  FREQUENCY  PT (By licensed PT), OT (By licensed OT)     PT Frequency: 5 OT Frequency: 5            Contractures Contractures Info: Not present    Additional Factors Info  Code Status, Allergies Code Status Info: Full code Allergies Info: Other Psychotropic Info: Xanax         Current Medications (09/20/2015):  This is the current hospital active medication list Current Facility-Administered Medications  Medication Dose Route Frequency Provider Last Rate Last Dose  . 0.9 %  sodium chloride infusion   Intravenous Continuous Carole Civil, MD 75 mL/hr at 09/19/15 [phone]    . albuterol (PROVENTIL) (2.5 MG/3ML) 0.083% nebulizer solution 2.5 mg  2.5 mg Nebulization Q6H PRN Kathie Dike, MD      . ALPRAZolam Duanne Moron) tablet 0.25 mg  0.25 mg Oral BID PRN Kathie Dike, MD   0.25 mg at 09/18/15 2253  . aspirin EC tablet 325 mg  325 mg Oral Q breakfast Carole Civil, MD   325 mg at 09/20/15 0810  . feeding supplement (ENSURE ENLIVE) (ENSURE ENLIVE) liquid 237 mL  237 mL Oral BID BM Jeneen Rinks, RD   237 mL at 09/19/15 1631  . magnesium citrate solution 1 Bottle  1 Bottle Oral Once PRN Carole Civil, MD      . menthol-cetylpyridinium (CEPACOL) lozenge 3 mg  1 lozenge Oral PRN Carole Civil, MD       Or  . phenol (CHLORASEPTIC) mouth spray 1 spray  1 spray Mouth/Throat PRN Tim Lair  Aline Brochure, MD      . metoCLOPramide [facility]) tablet 5-10 mg  5-10 mg Oral Q8H PRN Carole Civil, MD       Or  . metoCLOPramide (REGLAN) injection 5-10 mg  5-10 mg Intravenous Q8H PRN Carole Civil, MD      . multivitamin with minerals tablet 1 tablet  1 tablet Oral Daily Jeneen Rinks, RD   1 tablet at 09/20/15 1007  . nicotine (NICODERM CQ - dosed in mg/24 hours) patch 21 mg  21 mg Transdermal Daily Kathie Dike, MD   21 mg at 09/20/15 1009  . ondansetron (ZOFRAN) tablet 4 mg  4 mg Oral Q6H PRN Carole Civil, MD   4 mg at 09/20/15 1007   Or  . ondansetron (ZOFRAN)  injection 4 mg  4 mg Intravenous Q6H PRN Carole Civil, MD   4 mg at 09/19/15 1455  . oxyCODONE (Oxy IR/ROXICODONE) immediate release tablet 5-10 mg  5-10 mg Oral Q6H PRN Kathie Dike, MD   10 mg at 09/19/15 2153  . pantoprazole (PROTONIX) EC tablet 40 mg  40 mg Oral Daily Kathie Dike, MD   40 mg at 09/20/15 1007  . polyethylene glycol (MIRALAX / GLYCOLAX) packet 17 g  17 g Oral Daily PRN Kathie Dike, MD      . promethazine (PHENERGAN) injection 6.25 mg  6.25 mg Intravenous Q6H PRN Kathie Dike, MD   6.25 mg at 09/19/15 1754  . senna (SENOKOT) tablet 8.6 mg  1 tablet Oral BID Carole Civil, MD   8.6 mg at 09/20/15 1008  . sorbitol 70 % solution 30 mL  30 mL Oral Daily PRN Carole Civil, MD      . traMADol Veatrice Bourbon) tablet 50 mg  50 mg Oral 4 times per day Carole Civil, MD   50 mg at 09/20/15 [id number]     Discharge Medications: Please see discharge summary for a list of discharge medications.  Relevant Imaging Results:  Relevant Lab Results:   Additional Information SS#: [id number]  Salome Arnt, LCSW

## 2015-09-20 NOTE — Clinical Social Work Note (Signed)
Pt to transfer to [facility] today. Pt and facility aware and agreeable. Pt requesting transport via [facility]. Facility has received [employer].  Taylor Cannon, [place]

## 2015-09-20 NOTE — Care Management Note (Signed)
Case Management Note  Patient Details  Name: Taylor Cannon MRN: [id number] Date of Birth: Sep 13, 1933  Subjective/Objective:                  Pt is from home, admitted after hip fx. PT has recommended SNF. Pt is agreeable. CSW has arranged for placement.   Action/Plan: No CM needs.   Expected Discharge Date:  09/20/15               Expected Discharge Plan:  Skilled Nursing Facility  In-House Referral:  Clinical Social Work  Discharge planning Services  CM Consult  Post Acute Care Choice:  NA Choice offered to:  NA  DME Arranged:    DME Agency:     HH Arranged:    [place] Agency:     Status of Service:  Completed, signed off  Medicare Important Message Given:  Yes Date Medicare IM Given:    Medicare IM give by:    Date Additional Medicare IM Given:    Additional Medicare Important Message give by:     If discussed at [place] of Stay Meetings, dates discussed:    Additional Comments:  Sherald Barge, RN 09/20/2015, 11:11 AM

## 2015-09-20 NOTE — Care Management Important Message (Signed)
Important Message  Patient Details  Name: PHEONA MONTREUIL MRN: [id number] Date of Birth: 16-Jun-1934   Medicare Important Message Given:  Yes    Sherald Barge, RN 09/20/2015, 10:32 AM

## 2015-09-20 NOTE — Clinical Social Work Placement (Signed)
   CLINICAL SOCIAL WORK PLACEMENT  NOTE  Date:  09/20/2015  Patient Details  Name: Taylor Cannon MRN: [id number] Date of Birth: 10/07/1933  Clinical Social Work is seeking post-discharge placement for this patient at the [place] level of care (*CSW will initial, date and re-position this form in  chart as items are completed):  Yes   Patient/family provided with [place] Work Department's list of facilities offering this level of care within the geographic area requested by the patient (or if unable, by the patient's family).  Yes   Patient/family informed of their freedom to choose among providers that offer the needed level of care, that participate in Medicare, Medicaid or managed care program needed by the patient, have an available bed and are willing to accept the patient.  Yes   Patient/family informed of [facility]'s ownership interest in [facility] and [facility], as well as of the fact that they are under no obligation to receive care at these facilities.  PASRR submitted to EDS on       PASRR number received on       Existing PASRR number confirmed on       FL2 transmitted to all facilities in geographic area requested by pt/family on 09/19/15     FL2 transmitted to all facilities within larger geographic area on       Patient informed that his/her managed care company has contracts with or will negotiate with certain facilities, including the following:        Yes   Patient/family informed of bed offers received.  Patient chooses bed at [facility]     Physician recommends and patient chooses bed at      Patient to be transferred to [facility] on 09/20/15.  Patient to be transferred to facility by [facility]     Patient family notified on 09/20/15 of transfer.  Name of family member notified:  Pt states she has already notified family     PHYSICIAN       Additional  Comment:  [employer] authorization: [id number].  _______________________________________________ Salome Arnt, LCSW 09/20/2015, 3:28 PM [phone]

## 2015-09-20 NOTE — Progress Notes (Signed)
Report called to [facility].  All questions answered

## 2015-09-20 NOTE — Progress Notes (Signed)
Physical Therapy Treatment Patient Details Name: Taylor Cannon MRN: [id number] DOB: March 12, 1934 Today's Date: 09/20/2015    History of Present Illness 82 y.o. female with a hx of COPD, GERD, and a TIA that presented to the ED s/p a mechanical fall.  Displaced intertrochanteric fracture of the left hip. Status post ORIF of left hip on 1/15. At baseline, pt is fully indep in ADL, IADL, still driiving, llives alone with 5 steps to enter mobile. Pt reports some intermittent vertiginous episodes in the past several years, but nothing recent. Pt is presenting on POD1.     PT Comments    Pt motivated to get started this session and improve mobility, but asks to start out by AMB to BR. After voiding, pt noted to be desaturating with seated rest, and brought back to chair in room. Pt not tolerating activity well this session, limited more by sudden onset of nausea and worse tolerance to weight bearing on operative side. Pt remains motivated to participate, but more limited this session. Will continue to progress bed exercises and monitor O2 sats at subsequent sessions.   Follow Up Recommendations  SNF     Equipment Recommendations  None recommended by PT    Recommendations for Other Services       Precautions / Restrictions Precautions Precautions: None Restrictions Weight Bearing Restrictions: Yes LUE Weight Bearing: Weight bearing as tolerated    Mobility  Bed Mobility Overal bed mobility: Needs Assistance Bed Mobility: Supine to Sit     Supine to sit: HOB elevated;Max assist     General bed mobility comments: assistanc with bilat LE getting into bed, and LLE exiting bed; requires help with trunk.   Transfers Overall transfer level: Needs assistance Equipment used: Rolling walker (2 wheeled) Transfers: Sit to/from Stand Sit to Stand: Min assist         General transfer comment: very determined.   Ambulation/Gait   Ambulation Distance (Feet): 25 Feet Assistive device:  Rolling walker (2 wheeled)     Gait velocity interpretation: <1.8 ft/sec, indicative of risk for recurrent falls General Gait Details: Gait is more limited today, pt demonstrating decreased WB tolerance on surgical side. Strength and function in BUE with RW appears better controlled at the scapular level.    Stairs            Wheelchair Mobility    Modified Rankin (Stroke Patients Only)       Balance Overall balance assessment: No apparent balance deficits (not formally assessed);Needs assistance                                  Cognition Arousal/Alertness: Awake/alert Behavior During Therapy: WFL for tasks assessed/performed Overall Cognitive Status: Within Functional Limits for tasks assessed                      Exercises      General Comments        Pertinent Vitals/Pain Pain Assessment: 0-10 Pain Score: 7  Pain Location: L hip; no pain at rest.  Pain Intervention(s): Limited activity within patient's tolerance;Monitored during session;Premedicated before session;Repositioned;Patient requesting pain meds-RN notified    Home Living                      Prior Function            PT Goals (current goals can now be found in the care  plan section) Acute Rehab PT Goals Patient Stated Goal: regain independence  PT Goal Formulation: With patient Time For Goal Achievement: 10/03/15 Potential to Achieve Goals: Fair Progress towards PT goals: PT to reassess next treatment;Not progressing toward goals - comment (limited by nausea this session. )    Frequency  BID    PT Plan Current plan remains appropriate    Co-evaluation             End of Session Equipment Utilized During Treatment: Gait belt Activity Tolerance: Patient tolerated treatment well;Patient limited by pain;Other (comment) (nausea worsening with activity. ) Patient left: in chair;with call bell/phone within reach;with family/visitor present     Time:  [id number] PT Time Calculation (min) (ACUTE ONLY): 29 min  Charges:  $Gait Training: 8-22 mins                    G Codes:      Buccola,Allan C 10-16-15, 12:45 PM  12:49 PM  Etta Grandchild, PT, DPT [place] License # [id number]

## 2015-09-20 NOTE — Progress Notes (Signed)
Ok to discharge  Hip fracture instructions  Procedure open treatment internal fixation  Weightbearing status as tolerated  Staples removed postop day #  12  Followup visit. Postop day # 28th  X-rays please have x-rays performed. Postop day # 28  DVT prophylaxis x 28 days

## 2015-09-22 ENCOUNTER — Encounter (INDEPENDENT_AMBULATORY_CARE_PROVIDER_SITE_OTHER): Payer: Self-pay

## 2015-10-18 ENCOUNTER — Ambulatory Visit (INDEPENDENT_AMBULATORY_CARE_PROVIDER_SITE_OTHER): Payer: Self-pay | Admitting: Orthopedic Surgery

## 2015-10-18 ENCOUNTER — Encounter: Payer: Self-pay | Admitting: Orthopedic Surgery

## 2015-10-18 VITALS — BP 104/65 | Ht 68.0 in | Wt 107.0 lb

## 2015-10-18 DIAGNOSIS — S72142D Displaced intertrochanteric fracture of left femur, subsequent encounter for closed fracture with routine healing: Secondary | ICD-10-CM

## 2015-10-18 NOTE — Progress Notes (Signed)
Patient ID: Taylor Cannon, female   DOB: 1934-06-22, 82 y.o.   MRN: [id number]  Chief Complaint  Patient presents with  . Hip Injury    hospital follow up left hip fx, DOS 09/18/15    BP 104/65 mmHg  Ht 5\' 8"  (1.727 m)  Wt 107 lb (48.535 kg)  BMI 16.27 kg/m2  Incision looks clean dry and intact. Hip flexion 125 leg lengths are equal rotational alignment is normal. Knee range of motion normal no tenderness or swelling in the knee  2 x-ray reports are included wand knee x-ray report indicates osteoarthritis left knee. Hip x-ray report indicates stable fixation left hip  ASSESSMENT AND PLAN   Follow-up in one month x-ray in the office left hip short gamma nail.

## 2015-10-18 NOTE — Patient Instructions (Signed)
Weight bearing as tolerated  Scripts given for walker and wheelchair

## 2015-10-19 ENCOUNTER — Telehealth: Payer: Self-pay | Admitting: Orthopedic Surgery

## 2015-10-19 NOTE — Telephone Encounter (Signed)
Call received from this cardiologist office, ph [phone] - States their office received a fax from our office, and that this patient has not been seen there since 2000, so they are disregarding the fax. Patient is in [place], [place].  Please advise.

## 2015-10-20 NOTE — Telephone Encounter (Signed)
Unaware of any fax sent?

## 2015-10-24 ENCOUNTER — Inpatient Hospital Stay (HOSPITAL_COMMUNITY): Admission: RE | Admit: 2015-10-24 | Payer: Medicare Other | Source: Ambulatory Visit

## 2015-10-24 NOTE — Telephone Encounter (Signed)
If any communication needed from our clinic, we will process upon receipt.

## 2015-10-28 ENCOUNTER — Encounter (HOSPITAL_COMMUNITY): Admission: RE | Payer: Self-pay | Source: Ambulatory Visit

## 2015-10-28 ENCOUNTER — Ambulatory Visit (HOSPITAL_COMMUNITY): Admission: RE | Admit: 2015-10-28 | Payer: Medicare HMO | Source: Ambulatory Visit | Admitting: Internal Medicine

## 2015-10-28 SURGERY — ESOPHAGOGASTRODUODENOSCOPY (EGD) WITH PROPOFOL
Anesthesia: Monitor Anesthesia Care

## 2015-11-15 ENCOUNTER — Ambulatory Visit (INDEPENDENT_AMBULATORY_CARE_PROVIDER_SITE_OTHER): Payer: Self-pay | Admitting: Orthopedic Surgery

## 2015-11-15 ENCOUNTER — Ambulatory Visit (INDEPENDENT_AMBULATORY_CARE_PROVIDER_SITE_OTHER): Payer: Medicare HMO

## 2015-11-15 VITALS — BP 119/75 | HR 97 | Ht 68.0 in | Wt 101.8 lb

## 2015-11-15 DIAGNOSIS — S72142D Displaced intertrochanteric fracture of left femur, subsequent encounter for closed fracture with routine healing: Secondary | ICD-10-CM

## 2015-11-15 MED ORDER — HYDROCODONE-ACETAMINOPHEN 5-325 MG PO TABS: 1.0000 | ORAL_TABLET | Freq: Four times a day (QID) | ORAL | Status: DC | PRN

## 2015-11-15 NOTE — Patient Instructions (Signed)
Call [facility] to arrange PT

## 2015-11-15 NOTE — Progress Notes (Signed)
Patient ID: Taylor Cannon, female   DOB: [birth year], 82 y.o.   MRN: [id number]  Chief Complaint  Patient presents with  . Follow-up    Left gamma nail DOS 09/18/15    BP 119/75 mmHg  Pulse 97  Ht 5\' 8"  (1.727 m)  Wt 101 lb 12.8 oz (46.176 kg)  BMI 15.48 kg/m2  Left hip inotrope fracture gamma nail 8 weeks postop x-rays today show fractures healing well there is some heterotopic bone around the left hip  Otherwise exam is normal    ASSESSMENT AND PLAN   X-ray in 4 weeks start therapy a DOA are x-rays next visit

## 2015-11-16 ENCOUNTER — Other Ambulatory Visit: Payer: Self-pay | Admitting: *Deleted

## 2015-11-16 DIAGNOSIS — S72142D Displaced intertrochanteric fracture of left femur, subsequent encounter for closed fracture with routine healing: Secondary | ICD-10-CM

## 2015-12-20 ENCOUNTER — Encounter: Payer: Self-pay | Admitting: Orthopedic Surgery

## 2015-12-20 ENCOUNTER — Encounter: Payer: Self-pay | Admitting: *Deleted

## 2015-12-20 ENCOUNTER — Ambulatory Visit (INDEPENDENT_AMBULATORY_CARE_PROVIDER_SITE_OTHER): Payer: Medicare HMO

## 2015-12-20 ENCOUNTER — Ambulatory Visit (INDEPENDENT_AMBULATORY_CARE_PROVIDER_SITE_OTHER): Payer: Medicare HMO | Admitting: Orthopedic Surgery

## 2015-12-20 VITALS — BP 109/59 | Ht 68.0 in | Wt 99.0 lb

## 2015-12-20 DIAGNOSIS — M545 Low back pain, unspecified: Secondary | ICD-10-CM

## 2015-12-20 DIAGNOSIS — S72142D Displaced intertrochanteric fracture of left femur, subsequent encounter for closed fracture with routine healing: Secondary | ICD-10-CM

## 2015-12-20 MED ORDER — HYDROCODONE-ACETAMINOPHEN 5-325 MG PO TABS: 1.0000 | ORAL_TABLET | Freq: Four times a day (QID) | ORAL | Status: DC | PRN

## 2015-12-20 NOTE — Progress Notes (Signed)
Postop visit surgery date 09/18/2015 13 weeks 2 days post gamma nail fixation left hip  Patient is in therapy complaining that her hip gives out  Review of Systems  Constitutional: Negative for fever and chills.  Musculoskeletal: Positive for back pain and joint pain.     On exam today her leg lengths are equal. She has normal hip flexion. She has painless range of motion in the hip normal hip stability tests including push pull in flexion and extension muscle tone and strength are normal skin is intact she is a good distal pulse no edema and normal sensation. Mood and affect are normal she is oriented 3  X-ray shows fracture is healing appropriately hardware is in good position (independent review and interpretation)  She has tenderness in her lower back and this is the cause of her hip feeling like it's locking up and getting or going out of place  Recommend add on physical therapy for her back for lumbar stabilization  Follow-up in 2 months

## 2015-12-20 NOTE — Addendum Note (Signed)
Addended by: Baldomero Lamy B on: 12/20/2015 04:10 PM   Modules accepted: Orders

## 2016-02-21 ENCOUNTER — Ambulatory Visit (INDEPENDENT_AMBULATORY_CARE_PROVIDER_SITE_OTHER): Payer: Medicare HMO | Admitting: Orthopedic Surgery

## 2016-02-21 ENCOUNTER — Ambulatory Visit (INDEPENDENT_AMBULATORY_CARE_PROVIDER_SITE_OTHER): Payer: Medicare HMO

## 2016-02-21 VITALS — BP 137/73 | HR 105 | Ht 68.0 in | Wt 93.6 lb

## 2016-02-21 DIAGNOSIS — M545 Low back pain, unspecified: Secondary | ICD-10-CM

## 2016-02-21 DIAGNOSIS — S72142D Displaced intertrochanteric fracture of left femur, subsequent encounter for closed fracture with routine healing: Secondary | ICD-10-CM

## 2016-02-21 NOTE — Progress Notes (Signed)
Chief Complaint  Patient presents with  . Follow-up    Left hip fracture and back    The patient had gamma nail fixation left hip January 2017 complains of lower back pain and left hip pain  She says she's always had trouble with her lower back. We sent her for some physical therapy but she couldn't afford it so she had to stop she presents back for reevaluation of ongoing back pain.  The pain appears to be in the lower back radiates to the left hip and she has mild pain over the left greater trochanter  Review of systems reveals that the urinary and GI tract are normal she has no fever or significant weight gain. She has some loss of appetite from that after the surgery and has not regained  BP 137/73 mmHg  Pulse 105  Ht 5\' 8"  (1.727 m)  Wt 93 lb 9.6 oz (42.457 kg)  BMI 14.24 kg/m2 Physical Exam  Constitutional: She is oriented to person, place, and time. She appears well-developed and well-nourished. No distress.  Cardiovascular: Intact distal pulses.   Neurological: She is alert and oriented to person, place, and time. She exhibits normal muscle tone. Coordination normal.  Skin: Skin is warm and dry. No rash noted. She is not diaphoretic. No erythema. No pallor.  Psychiatric: She has a normal mood and affect. Her behavior is normal. Judgment and thought content normal.  Gait is supported by a walker. I do not detect any leg length discrepancy but there is spinal deformity. This gives the appearance of a hip height difference when measuring the iliac crest  The lumbar spine remains tender in the L4-L5 L5-S1 central and left lateral position.  Motor exam shows no weakness in the right or left lower extremity and she has normal hip flexion in the right and left hip  Today's plain films of lumbar spine were ordered  I have read the films and healed the following report L5-S1 disc space narrowing osteopenia calcification of the pelvic vessels  After discussing this with her it seems  that she would like to proceed with home exercises and if the symptoms worsen or she gets radicular symptoms she will call us back for reevaluation.

## 2016-04-10 ENCOUNTER — Encounter (INDEPENDENT_AMBULATORY_CARE_PROVIDER_SITE_OTHER): Payer: Self-pay | Admitting: Internal Medicine

## 2016-04-10 ENCOUNTER — Ambulatory Visit (INDEPENDENT_AMBULATORY_CARE_PROVIDER_SITE_OTHER): Payer: Medicare HMO | Admitting: Internal Medicine

## 2016-04-10 VITALS — BP 116/68 | HR 72 | Temp 98.1°F | Resp 16 | Ht 68.0 in | Wt 97.1 lb

## 2016-04-10 DIAGNOSIS — E611 Iron deficiency: Secondary | ICD-10-CM

## 2016-04-10 DIAGNOSIS — K222 Esophageal obstruction: Secondary | ICD-10-CM

## 2016-04-10 MED ORDER — FLINTSTONES PLUS IRON PO CHEW: 1.0000 | CHEWABLE_TABLET | Freq: Two times a day (BID) | ORAL | Status: DC

## 2016-04-10 NOTE — Progress Notes (Signed)
Presenting complaint;  Follow-up for esophageal stricture. Patient recently found to have iron deficiency.  Database and Subjective:  Patient is 82 year old Caucasian female who has chronic GERD and history of recalcitrant esophageal stricture requiring frequent dilation. This stricture was last dilated in January 2017 to 16.5 mm. I recommended that she return in few weeks so the stricture could be dilated further to 18 mm. A few days later she tripped over nasal O2 tubing fell and broke her left hip and underwent surgery at [facility] on 09/18/2015. She feels she has fully recovered from it. She had blood work by Dr. Elyn Aquas and noted to have low MCV mild thrombocytopenia and low serum ferritin. Dr. Salli Real felt patient may need a colonoscopy. Patient denies melena or rectal bleeding. She also denies hematuria or vaginal bleeding. Last colonoscopy was several years ago and it was on pleasant experience. She continues to experience dysphagia with solids and some of her pills. She denies heartburn. She states appetite has improved since she was begun on Megace. She feels she may have gained a few pounds in the last few weeks. She has not had Hemoccults recently. She uses nasal O2 only at night.    Current Medications: Outpatient Encounter Prescriptions as of 04/10/2016  Medication Sig  . ALPRAZolam (XANAX) 0.25 MG tablet Take 1 tablet (0.25 mg total) by mouth 2 (two) times daily as needed for anxiety.  Marland Kitchen aspirin EC 81 MG tablet Take 81 mg by mouth daily.  Marland Kitchen HYDROcodone-acetaminophen (NORCO/VICODIN) 5-325 MG tablet Take 1 tablet by mouth every 6 (six) hours as needed for moderate pain.  . Iron-FA-B Cmp-C-Biot-Probiotic (FUSION PLUS) CAPS TK 1 C PO QD  . megestrol (MEGACE) 40 MG tablet TK 1 T PO  BID TO IMPROVE APPETITE  . mirtazapine (REMERON) 30 MG tablet Take 30 mg by mouth at bedtime.   . NON FORMULARY Thrive - 1 package a day. Patient states that she takes 1/2 pack a day when she takes this.   Marland Kitchen omeprazole (PRILOSEC) 20 MG capsule Take 20 mg by mouth daily.  . vitamin C (ASCORBIC ACID) 500 MG tablet Take 500 mg by mouth daily.  . [DISCONTINUED] aspirin EC 325 MG EC tablet Take 1 tablet (325 mg total) by mouth daily with breakfast.  . [DISCONTINUED] calcium-vitamin D (OSCAL WITH D) 500-200 MG-UNIT per tablet Take 1 tablet by mouth daily with breakfast.   No facility-administered encounter medications on file as of 04/10/2016.      Objective: Blood pressure 116/68, pulse 72, temperature 98.1 F (36.7 C), temperature source Oral, resp. rate 16, height 5\' 8"  (1.727 m), weight 97 lb 1.6 oz (44 kg). Well-developed thin Caucasian female in NAD. Conjunctiva is pink. Sclera is nonicteric Oropharyngeal mucosa is normal. No neck masses or thyromegaly noted. Cardiac exam with regular rhythm normal S1 and S2. No murmur or gallop noted. Lungs are clear to auscultation. Abdomen is flat and soft without organomegaly masses or tenderness.  Patient declined rectal exam. No LE edema or clubbing noted.  Labs/studies Results: Lab data from 02/28/2016  WBC 6.5, H&H 14 and 45.2 and platelet count 516K  MCV 75  BUN 8 and creatinine 0.79  Serum calcium 9.2  Bilirubin 0.3, AP 68, AST 30, ALT 11, total protein 6.3 and albumin 3.8   Serum ferritin 10 Serum B12 599 and folate greater than 20   Assessment:  #1. Distal esophageal stricture felt to be secondary to chronic GERD and difficult to keep open. Stricture was lost dilated to  16.5 mm in January 2017 and plan was her to come back in few weeks for dilation to 18 mm but this did not materialize because she fell and broke her hip. Her esophagus needs to be redilated. #2. Weight loss. Weight loss appears to be multifactorial. Dysphagia is contradictory. She also has anorexia and appetite has improved with Megace. #3. Iron deficiency without anemia. She may have impaired iron absorption because of chronic PPI therapy. She is reluctant to consider  colonoscopy at this time. If stool is heme positive she may change her mind.   Plan:  Hemoccult 1. Requests copy of blood work that she had yesterday by Dr. Salli Real. Esophagogastroduodenoscopy with esophageal dilation under monitored anesthesia care. She will have CBC prior to EGD. Will hold off colonoscopy for now. Office visit in 6 months.

## 2016-04-10 NOTE — Patient Instructions (Addendum)
Hemoccult 1. Esophagogastroduodenoscopy and esophageal dilation to be scheduled. Request copy of recent blood work for review.

## 2016-04-11 ENCOUNTER — Encounter (INDEPENDENT_AMBULATORY_CARE_PROVIDER_SITE_OTHER): Payer: Self-pay | Admitting: *Deleted

## 2016-04-11 ENCOUNTER — Other Ambulatory Visit (INDEPENDENT_AMBULATORY_CARE_PROVIDER_SITE_OTHER): Payer: Self-pay | Admitting: *Deleted

## 2016-04-11 DIAGNOSIS — D509 Iron deficiency anemia, unspecified: Secondary | ICD-10-CM

## 2016-04-11 DIAGNOSIS — K222 Esophageal obstruction: Secondary | ICD-10-CM

## 2016-04-11 HISTORY — DX: Esophageal obstruction: K22.2

## 2016-04-25 ENCOUNTER — Encounter (INDEPENDENT_AMBULATORY_CARE_PROVIDER_SITE_OTHER): Payer: Self-pay

## 2016-04-30 NOTE — Patient Instructions (Signed)
Taylor Cannon  04/30/2016     @PREFPERIOPPHARMACY @   Your procedure is scheduled on 05/04/2016.  Report to [facility] at 8:00 A.M.  Call this number if you have problems the morning of surgery:  [phone]   Remember:  Do not eat food or drink liquids after midnight.  Take these medicines the morning of surgery with A SIP OF WATER : XANAX AND PRILOSEC   Do not wear jewelry, make-up or nail polish.  Do not wear lotions, powders, or perfumes, or deoderant.  Do not shave 48 hours prior to surgery.  Men may shave face and neck.  Do not bring valuables to the hospital.  [facility] is not responsible for any belongings or valuables.  Contacts, dentures or bridgework may not be worn into surgery.  Leave your suitcase in the car.  After surgery it may be brought to your room.  For patients admitted to the hospital, discharge time will be determined by your treatment team.  Patients discharged the day of surgery will not be allowed to drive home.   Name and phone number of your driver:   FAMILY Special instructions:  N/A  Please read over the following fact sheets that you were given. Care and Recovery After Surgery    Esophagogastroduodenoscopy Esophagogastroduodenoscopy (EGD) is a procedure that is used to examine the lining of the esophagus, stomach, and first part of the small intestine (duodenum). A long, flexible, lighted tube with a camera attached (endoscope) is inserted down the throat to view these organs. This procedure is done to detect problems or abnormalities, such as inflammation, bleeding, ulcers, or growths, in order to treat them. The procedure lasts 5-20 minutes. It is usually an outpatient procedure, but it may need to be performed in a hospital in emergency cases. LET [facility] CARE PROVIDER KNOW ABOUT:  Any allergies you have.  All medicines you are taking, including vitamins, herbs, eye drops, creams, and over-the-counter medicines.  Previous problems  you or members of your family have had with the use of anesthetics.  Any blood disorders you have.  Previous surgeries you have had.  Medical conditions you have. RISKS AND COMPLICATIONS Generally, this is a safe procedure. However, problems can occur and include:  Infection.  Bleeding.  Tearing (perforation) of the esophagus, stomach, or duodenum.  Difficulty breathing or not being able to breathe.  Excessive sweating.  Spasms of the larynx.  Slowed heartbeat.  Low blood pressure. BEFORE THE PROCEDURE  Do not eat or drink anything after midnight on the night before the procedure or as directed by your health care provider.  Do not take your regular medicines before the procedure if your health care provider asks you not to. Ask your health care provider about changing or stopping those medicines.  If you wear dentures, be prepared to remove them before the procedure.  Arrange for someone to drive you home after the procedure. PROCEDURE  A numbing medicine (local anesthetic) may be sprayed in your throat for comfort and to stop you from gagging or coughing.  You will have an IV tube inserted in a vein in your hand or arm. You will receive medicines and fluids through this tube.  You will be given a medicine to relax you (sedative).  A pain reliever will be given through the IV tube.  A mouth guard may be placed in your mouth to protect your teeth and to keep you from biting on the endoscope.  You will be  asked to lie on your left side.  The endoscope will be inserted down your throat and into your esophagus, stomach, and duodenum.  Air will be put through the endoscope to allow your health care provider to clearly view the lining of your esophagus.  The lining of your esophagus, stomach, and duodenum will be examined. During the exam, your health care provider may:  Remove tissue to be examined under a microscope (biopsy) for inflammation, infection, or other  medical problems.  Remove growths.  Remove objects (foreign bodies) that are stuck.  Treat any bleeding with medicines or other devices that stop tissues from bleeding (hot cautery, clipping devices).  Widen (dilate) or stretch narrowed areas of your esophagus and stomach.  The endoscope will be withdrawn. AFTER THE PROCEDURE  You will be taken to a recovery area for observation. Your blood pressure, heart rate, breathing rate, and blood oxygen level will be monitored often until the medicines you were given have worn off.  Do not eat or drink anything until the numbing medicine has worn off and your gag reflex has returned. You may choke.  Your health care provider should be able to discuss his or her findings with you. It will take longer to discuss the test results if any biopsies were taken.   This information is not intended to replace advice given to you by your health care provider. Make sure you discuss any questions you have with your health care provider.   Document Released: 12/21/2004 Document Revised: 09/10/2014 Document Reviewed: 07/23/2012 Elsevier Interactive Patient Education [employer].

## 2016-05-01 ENCOUNTER — Encounter (HOSPITAL_COMMUNITY): Payer: Self-pay

## 2016-05-01 ENCOUNTER — Encounter (HOSPITAL_COMMUNITY)
Admission: RE | Admit: 2016-05-01 | Discharge: 2016-05-01 | Disposition: A | Discharge status: Home / Self Care | Payer: Medicare HMO | Source: Ambulatory Visit | Attending: Internal Medicine | Admitting: Internal Medicine

## 2016-05-01 DIAGNOSIS — K222 Esophageal obstruction: Secondary | ICD-10-CM | POA: Diagnosis not present

## 2016-05-01 DIAGNOSIS — Z79818 Long term (current) use of other agents affecting estrogen receptors and estrogen levels: Secondary | ICD-10-CM | POA: Diagnosis not present

## 2016-05-01 DIAGNOSIS — Z7982 Long term (current) use of aspirin: Secondary | ICD-10-CM | POA: Diagnosis not present

## 2016-05-01 DIAGNOSIS — F17210 Nicotine dependence, cigarettes, uncomplicated: Secondary | ICD-10-CM | POA: Diagnosis not present

## 2016-05-01 DIAGNOSIS — K449 Diaphragmatic hernia without obstruction or gangrene: Secondary | ICD-10-CM | POA: Diagnosis not present

## 2016-05-01 DIAGNOSIS — J449 Chronic obstructive pulmonary disease, unspecified: Secondary | ICD-10-CM | POA: Diagnosis not present

## 2016-05-01 DIAGNOSIS — K219 Gastro-esophageal reflux disease without esophagitis: Secondary | ICD-10-CM | POA: Diagnosis not present

## 2016-05-01 DIAGNOSIS — F419 Anxiety disorder, unspecified: Secondary | ICD-10-CM | POA: Diagnosis not present

## 2016-05-01 DIAGNOSIS — Z8673 Personal history of transient ischemic attack (TIA), and cerebral infarction without residual deficits: Secondary | ICD-10-CM | POA: Diagnosis not present

## 2016-05-01 DIAGNOSIS — Z862 Personal history of diseases of the blood and blood-forming organs and certain disorders involving the immune mechanism: Secondary | ICD-10-CM | POA: Diagnosis not present

## 2016-05-01 DIAGNOSIS — D473 Essential (hemorrhagic) thrombocythemia: Secondary | ICD-10-CM | POA: Diagnosis not present

## 2016-05-01 DIAGNOSIS — Z79899 Other long term (current) drug therapy: Secondary | ICD-10-CM | POA: Diagnosis not present

## 2016-05-01 DIAGNOSIS — Z9981 Dependence on supplemental oxygen: Secondary | ICD-10-CM | POA: Diagnosis not present

## 2016-05-01 LAB — CBC WITH DIFFERENTIAL/PLATELET
Basophils Absolute: 0 10*3/uL (ref 0.0–0.1)
Basophils Relative: 0 %
Eosinophils Absolute: 0.2 10*3/uL (ref 0.0–0.7)
Eosinophils Relative: 2 %
HCT: 45 % (ref 36.0–46.0)
Hemoglobin: 15 g/dL (ref 12.0–15.0)
Lymphocytes Relative: 9 %
Lymphs Abs: 0.8 10*3/uL (ref 0.7–4.0)
MCH: 25.6 pg — ABNORMAL LOW (ref 26.0–34.0)
MCHC: 33.3 g/dL (ref 30.0–36.0)
MCV: 76.9 fL — ABNORMAL LOW (ref 78.0–100.0)
Monocytes Absolute: 0.9 10*3/uL (ref 0.1–1.0)
Monocytes Relative: 9 %
Neutro Abs: 7.4 10*3/uL (ref 1.7–7.7)
Neutrophils Relative %: 80 %
Platelets: 661 10*3/uL — ABNORMAL HIGH (ref 150–400)
RBC: 5.85 MIL/uL — ABNORMAL HIGH (ref 3.87–5.11)
RDW: 22.3 % — ABNORMAL HIGH (ref 11.5–15.5)
WBC: 9.2 10*3/uL (ref 4.0–10.5)

## 2016-05-01 LAB — BASIC METABOLIC PANEL
Anion gap: 6 (ref 5–15)
BUN: 16 mg/dL (ref 6–20)
CO2: 29 mmol/L (ref 22–32)
Calcium: 9.6 mg/dL (ref 8.9–10.3)
Chloride: 98 mmol/L — ABNORMAL LOW (ref 101–111)
Creatinine, Ser: 0.81 mg/dL (ref 0.44–1.00)
GFR calc Af Amer: >60 mL/min (ref >60)
GFR calc non Af Amer: >60 mL/min (ref >60)
Glucose, Bld: 100 mg/dL — ABNORMAL HIGH (ref 65–99)
Potassium: 5.5 mmol/L — ABNORMAL HIGH (ref 3.5–5.1)
Sodium: 133 mmol/L — ABNORMAL LOW (ref 135–145)

## 2016-05-01 NOTE — Pre-Procedure Instructions (Signed)
Patient given information to sign up for my chart at home. 

## 2016-05-02 NOTE — Pre-Procedure Instructions (Signed)
Potassium 5.5 shown to Dr Patsey Berthold. Will do Istat on arrival day of surgery.

## 2016-05-04 ENCOUNTER — Ambulatory Visit (HOSPITAL_COMMUNITY): Payer: Medicare HMO | Admitting: Anesthesiology

## 2016-05-04 ENCOUNTER — Ambulatory Visit (HOSPITAL_COMMUNITY)
Admission: RE | Admit: 2016-05-04 | Discharge: 2016-05-04 | Disposition: A | Discharge status: Home / Self Care | Payer: Medicare HMO | Source: Ambulatory Visit | Attending: Internal Medicine | Admitting: Internal Medicine

## 2016-05-04 ENCOUNTER — Encounter (HOSPITAL_COMMUNITY): Payer: Self-pay | Admitting: *Deleted

## 2016-05-04 ENCOUNTER — Encounter (HOSPITAL_COMMUNITY)
Admission: RE | Disposition: A | Discharge status: Home / Self Care | Payer: Self-pay | Source: Ambulatory Visit | Attending: Internal Medicine

## 2016-05-04 DIAGNOSIS — D509 Iron deficiency anemia, unspecified: Secondary | ICD-10-CM | POA: Diagnosis not present

## 2016-05-04 DIAGNOSIS — K449 Diaphragmatic hernia without obstruction or gangrene: Secondary | ICD-10-CM

## 2016-05-04 DIAGNOSIS — F17210 Nicotine dependence, cigarettes, uncomplicated: Secondary | ICD-10-CM | POA: Insufficient documentation

## 2016-05-04 DIAGNOSIS — Z8673 Personal history of transient ischemic attack (TIA), and cerebral infarction without residual deficits: Secondary | ICD-10-CM | POA: Insufficient documentation

## 2016-05-04 DIAGNOSIS — Z9981 Dependence on supplemental oxygen: Secondary | ICD-10-CM | POA: Insufficient documentation

## 2016-05-04 DIAGNOSIS — K222 Esophageal obstruction: Secondary | ICD-10-CM | POA: Diagnosis not present

## 2016-05-04 DIAGNOSIS — K219 Gastro-esophageal reflux disease without esophagitis: Secondary | ICD-10-CM | POA: Diagnosis not present

## 2016-05-04 DIAGNOSIS — Z7982 Long term (current) use of aspirin: Secondary | ICD-10-CM | POA: Insufficient documentation

## 2016-05-04 DIAGNOSIS — R1314 Dysphagia, pharyngoesophageal phase: Secondary | ICD-10-CM | POA: Diagnosis not present

## 2016-05-04 DIAGNOSIS — Z79818 Long term (current) use of other agents affecting estrogen receptors and estrogen levels: Secondary | ICD-10-CM | POA: Insufficient documentation

## 2016-05-04 DIAGNOSIS — F419 Anxiety disorder, unspecified: Secondary | ICD-10-CM | POA: Insufficient documentation

## 2016-05-04 DIAGNOSIS — Z79899 Other long term (current) drug therapy: Secondary | ICD-10-CM | POA: Insufficient documentation

## 2016-05-04 DIAGNOSIS — Z862 Personal history of diseases of the blood and blood-forming organs and certain disorders involving the immune mechanism: Secondary | ICD-10-CM | POA: Insufficient documentation

## 2016-05-04 DIAGNOSIS — D473 Essential (hemorrhagic) thrombocythemia: Secondary | ICD-10-CM | POA: Insufficient documentation

## 2016-05-04 DIAGNOSIS — J449 Chronic obstructive pulmonary disease, unspecified: Secondary | ICD-10-CM | POA: Insufficient documentation

## 2016-05-04 HISTORY — PX: ESOPHAGOGASTRODUODENOSCOPY (EGD) WITH PROPOFOL: SHX5813

## 2016-05-04 HISTORY — PX: ESOPHAGEAL DILATION: SHX303

## 2016-05-04 LAB — POCT I-STAT 4, (NA,K, GLUC, HGB,HCT)
Glucose, Bld: 93 mg/dL (ref 65–99)
HCT: 45 % (ref 36.0–46.0)
Hemoglobin: 15.3 g/dL — ABNORMAL HIGH (ref 12.0–15.0)
Potassium: 3.9 mmol/L (ref 3.5–5.1)
Sodium: 137 mmol/L (ref 135–145)

## 2016-05-04 SURGERY — ESOPHAGOGASTRODUODENOSCOPY (EGD) WITH PROPOFOL
Anesthesia: Monitor Anesthesia Care

## 2016-05-04 MED ORDER — LACTATED RINGERS IV SOLN
INTRAVENOUS | Status: DC
  Administered 2016-05-04: 09:00:00 via INTRAVENOUS

## 2016-05-04 MED ORDER — MIDAZOLAM HCL 2 MG/2ML IJ SOLN
INTRAMUSCULAR | Status: AC
  Filled 2016-05-04: qty 2

## 2016-05-04 MED ORDER — BUTAMBEN-TETRACAINE-BENZOCAINE 2-2-14 % EX AERO: 1.0000 | INHALATION_SPRAY | Freq: Once | CUTANEOUS | Status: DC

## 2016-05-04 MED ORDER — HYDROMORPHONE HCL 1 MG/ML IJ SOLN: 0.2500 mg | INTRAMUSCULAR | Status: DC | PRN

## 2016-05-04 MED ORDER — LIDOCAINE HCL (CARDIAC) 10 MG/ML IV SOLN
INTRAVENOUS | Status: DC | PRN
  Administered 2016-05-04: 30 mg via INTRAVENOUS

## 2016-05-04 MED ORDER — SIMETHICONE 40 MG/0.6ML PO SUSP
ORAL | Status: DC | PRN
  Administered 2016-05-04: 100 mL

## 2016-05-04 MED ORDER — PROPOFOL 10 MG/ML IV BOLUS
INTRAVENOUS | Status: DC | PRN
  Administered 2016-05-04 (×4): 10 mg via INTRAVENOUS

## 2016-05-04 MED ORDER — FENTANYL CITRATE (PF) 100 MCG/2ML IJ SOLN
INTRAMUSCULAR | Status: AC
  Filled 2016-05-04: qty 2

## 2016-05-04 MED ORDER — MIDAZOLAM HCL 2 MG/2ML IJ SOLN
1.0000 mg | INTRAMUSCULAR | Status: DC | PRN
  Administered 2016-05-04: 2 mg via INTRAVENOUS

## 2016-05-04 MED ORDER — PROPOFOL 500 MG/50ML IV EMUL
INTRAVENOUS | Status: DC | PRN
  Administered 2016-05-04: 50 ug/kg/min via INTRAVENOUS

## 2016-05-04 MED ORDER — SODIUM CHLORIDE 0.9 % IV SOLN: INTRAVENOUS | Status: DC

## 2016-05-04 MED ORDER — FENTANYL CITRATE (PF) 100 MCG/2ML IJ SOLN
25.0000 ug | INTRAMUSCULAR | Status: AC | PRN
  Administered 2016-05-04 (×2): 25 ug via INTRAVENOUS

## 2016-05-04 NOTE — Anesthesia Preprocedure Evaluation (Signed)
Anesthesia Evaluation  Patient identified by MRN, date of birth, ID band Patient awake    Reviewed: Allergy & Precautions, NPO status , Patient's Chart, lab work & pertinent test results, reviewed documented beta blocker date and time   Airway Mallampati: I   Neck ROM: Full    Dental  (+) Edentulous Upper, Edentulous Lower   Pulmonary shortness of breath, with exertion and Long-Term Oxygen Therapy, COPD,  oxygen dependent, Current Smoker,    breath sounds clear to auscultation (-) decreased breath sounds      Cardiovascular Exercise Tolerance: Good  Rhythm:Regular Rate:Normal     Neuro/Psych PSYCHIATRIC DISORDERS Anxiety Depression TIA   GI/Hepatic GERD  Controlled,  Endo/Other    Renal/GU      Musculoskeletal   Abdominal (+)  Abdomen: soft. Bowel sounds: normal.  Peds  Hematology  (+) anemia ,   Anesthesia Other Findings   Reproductive/Obstetrics                             Anesthesia Physical Anesthesia Plan  ASA: III  Anesthesia Plan: MAC   Post-op Pain Management:    Induction: Intravenous  Airway Management Planned: Simple Face Mask  Additional Equipment:   Intra-op Plan:   Post-operative Plan:   Informed Consent: I have reviewed the patients History and Physical, chart, labs and discussed the procedure including the risks, benefits and alternatives for the proposed anesthesia with the patient or authorized representative who has indicated his/her understanding and acceptance.     Plan Discussed with:   Anesthesia Plan Comments:         Anesthesia Quick Evaluation

## 2016-05-04 NOTE — Anesthesia Postprocedure Evaluation (Signed)
Anesthesia Post Note  Patient: Taylor Cannon  Procedure(s) Performed: Procedure(s) (LRB): ESOPHAGOGASTRODUODENOSCOPY (EGD) WITH PROPOFOL (N/A) ESOPHAGEAL DILATION (N/A)  Patient location during evaluation: PACU Anesthesia Type: MAC Level of consciousness: awake Pain management: pain level controlled Vital Signs Assessment: post-procedure vital signs reviewed and stable Respiratory status: spontaneous breathing Cardiovascular status: stable Anesthetic complications: no    Last Vitals:  Vitals:   05/04/16 0920 05/04/16 1007  BP: 125/68   Pulse:    Resp: (!) 25   Temp:  36.6 C    Last Pain:  Vitals:   05/04/16 0848  TempSrc: Oral                 Hena Ewalt

## 2016-05-04 NOTE — Discharge Instructions (Signed)
No aspirin or NSAIDs for 3 days. Resume other medications and diet as before. No driving for 24 hours. Repeat dilation in 3-4 weeks. Office will call you.  Esophagogastroduodenoscopy, Care After Refer to this sheet in the next few weeks. These instructions provide you with information about caring for yourself after your procedure. Your health care provider may also give you more specific instructions. Your treatment has been planned according to current medical practices, but problems sometimes occur. Call your health care provider if you have any problems or questions after your procedure. WHAT TO EXPECT AFTER THE PROCEDURE After your procedure, it is typical to feel:  Soreness in your throat.  Pain with swallowing.  Sick to your stomach (nauseous).  Bloated.  Dizzy.  Fatigued. HOME CARE INSTRUCTIONS  Do not eat or drink anything until the numbing medicine (local anesthetic) has worn off and your gag reflex has returned. You will know that the local anesthetic has worn off when you can swallow comfortably.  Do not drive or operate machinery until directed by your health care provider.  Take medicines only as directed by your health care provider. SEEK MEDICAL CARE IF:   You cannot stop coughing.  You are not urinating at all or less than usual. SEEK IMMEDIATE MEDICAL CARE IF:  You have difficulty swallowing.  You cannot eat or drink.  You have worsening throat or chest pain.  You have dizziness or lightheadedness or you faint.  You have nausea or vomiting.  You have chills.  You have a fever.  You have severe abdominal pain.  You have black, tarry, or bloody stools.   This information is not intended to replace advice given to you by your health care provider. Make sure you discuss any questions you have with your health care provider.   Document Released: 08/06/2012 Document Revised: 09/10/2014 Document Reviewed: 08/06/2012 Elsevier Interactive Patient  Education [employer].

## 2016-05-04 NOTE — Transfer of Care (Signed)
Immediate Anesthesia Transfer of Care Note  Patient: Taylor Cannon  Procedure(s) Performed: Procedure(s) with comments: ESOPHAGOGASTRODUODENOSCOPY (EGD) WITH PROPOFOL (N/A) - 9:30 ESOPHAGEAL DILATION (N/A)  Patient Location: PACU  Anesthesia Type:MAC  Level of Consciousness: awake and patient cooperative  Airway & Oxygen Therapy: Patient Spontanous Breathing and non-rebreather face mask  Post-op Assessment: Report given to RN and Post -op Vital signs reviewed and stable  Post vital signs: stable  Last Vitals:  Vitals:   05/04/16 0915 05/04/16 0920  BP: 119/73 125/68  Pulse:    Resp: 20 (!) 25  Temp:      Last Pain:  Vitals:   05/04/16 0848  TempSrc: Oral      Patients Stated Pain Goal: 7 [phone])  Complications: No apparent anesthesia complications

## 2016-05-04 NOTE — Op Note (Signed)
[facility] Patient Name: Taylor Cannon Procedure Date: 05/04/2016 9:39 AM MRN: [id number] Date of Birth: September 30, 1933 Attending MD: Hildred Laser , MD CSN: [id number] Age: 82 Admit Type: Outpatient Procedure:                Upper GI endoscopy Indications:              For therapy of esophageal stricture. History of                            iron deficiency anemia(Hgb 15 today). Providers:                Hildred Laser, MD, Tammy Vaught, RN, Purcell Nails.                            [place], Technician Referring MD:             Elyn Aquas MD, MD Medicines:                Cetacaine spray, Propofol per Anesthesia Complications:            No immediate complications. Estimated Blood Loss:     Estimated blood loss was minimal. Procedure:                Pre-Anesthesia Assessment:                           - Prior to the procedure, a History and Physical                            was performed, and patient medications and                            allergies were reviewed. The patient's tolerance of                            previous anesthesia was also reviewed. The risks                            and benefits of the procedure and the sedation                            options and risks were discussed with the patient.                            All questions were answered, and informed consent                            was obtained. Prior Anticoagulants: The patient                            last took aspirin 2 days prior to the procedure.                            ASA Grade Assessment: III - A patient with severe  systemic disease. After reviewing the risks and                            benefits, the patient was deemed in satisfactory                            condition to undergo the procedure.                           After obtaining informed consent, the endoscope was                            passed under direct vision. Throughout the              procedure, the patient's blood pressure, pulse, and                            oxygen saturations were monitored continuously. The                            [id number]) scope was introduced through the                            mouth, and advanced to the second part of duodenum.                            The upper GI endoscopy was accomplished without                            difficulty. The patient tolerated the procedure                            well. Scope In: 9:44:00 AM Scope Out: 10:00:35 AM Total Procedure Duration: 0 hours 16 minutes 35 seconds  Findings:      The examined esophagus was normal.      One severe benign-appearing, intrinsic stenosis was found at GEJ; 32 cm       from the incisors. This measured 7 mm (inner diameter) x less than one       cm (in length) and was traversed after dilation. A TTS dilator was       passed through the scope. Dilation with a 12-13.5-15 mm balloon dilator       was performed to 12 mm, 13.5 mm and 15 mm. The dilation site was       examined and showed moderate improvement in luminal narrowing.      A large hiatal hernia was present.      The entire examined stomach was normal.      The duodenal bulb and second portion of the duodenum were normal. Impression:               - Normal esophagus.                           - Benign-appearing esophageal stenosis aty GEJ.  Dilated.                           - Large hiatal hernia.                           - Normal stomach.                           - Normal duodenal bulb and second portion of the                            duodenum.                           - No specimens collected. Moderate Sedation:      Per Anesthesia Care Recommendation:           - Patient has a contact number available for                            emergencies. The signs and symptoms of potential                            delayed complications were discussed with the                             patient. Return to normal activities tomorrow.                            Written discharge instructions were provided to the                            patient.                           - Patient has a contact number available for                            emergencies. The signs and symptoms of potential                            delayed complications were discussed with the                            patient. Return to normal activities tomorrow.                            Written discharge instructions were provided to the                            patient.                           - Mechanical soft diet today.                           - Continue  present medications.                           - No aspirin, ibuprofen, naproxen, or other                            non-steroidal anti-inflammatory drugs for 3 days.                           - Repeat upper endoscopy in 4 weeks for retreatment. Procedure Code(s):        --- Professional ---                           [phone], Esophagogastroduodenoscopy, flexible,                            transoral; with transendoscopic balloon dilation of                            esophagus (less than 30 mm diameter) Diagnosis Code(s):        --- Professional ---                           K22.2, Esophageal obstruction                           K44.9, Diaphragmatic hernia without obstruction or                            gangrene CPT copyright 2016 American Medical Association. All rights reserved. The codes documented in this report are preliminary and upon coder review may  be revised to meet current compliance requirements. Hildred Laser, MD Hildred Laser, MD 05/04/2016 10:14:25 AM This report has been signed electronically. Number of Addenda: 0

## 2016-05-04 NOTE — H&P (Addendum)
Taylor Cannon is an 82 y.o. female.   Chief Complaint: Patient is here for EGD and ED. HPI: Patient is 82 year old Caucasian female with multiple medical problems including chronic GERD complicated by high-grade distal esophageal stricture difficult to keep open. This stricture was dilated in January this year to 16.5 mm. A she was supposed to return in 6-8 weeks for repeat dilation but she fell and broke her hip and had to have surgery. She remains with dysphagia to solids. She has difficulty with pills but not with liquids. She feels heartburns controlled with therapy. She was found to have iron deficiency anemia on routine blood work by her PCP Dr. Elyn Cannon. There is no history of melena or rectal bleeding. She had CBC earlier this week and her H&H is 15 and 45.0. Platelet count is 661K.  Patient brought Hemoccult card today and it is negative(expires July 2018)  Past Medical History:  Diagnosis Date  . Anxiety   . COPD (chronic obstructive pulmonary disease) ([place])   . Depression   . GERD (gastroesophageal reflux disease)   . On home oxygen therapy    2L via  at night  . TIA (transient ischemic attack)     Past Surgical History:  Procedure Laterality Date  . APPENDECTOMY    . BALLOON DILATION N/A 07/24/2013   Procedure: BALLOON DILATION to 13.57mm;  Surgeon: Taylor Houston, MD;  Location: [facility];  Service: Endoscopy;  Laterality: N/A;  . BALLOON DILATION N/A 11/11/2014   Procedure: BALLOON DILATION;  Surgeon: Taylor Houston, MD;  Location: [facility];  Service: Endoscopy;  Laterality: N/A;  . CARDIAC ELECTROPHYSIOLOGY STUDY AND ABLATION    . CATARACT EXTRACTION Bilateral   . CERVICAL CONE BIOPSY    . ESOPHAGEAL DILATION N/A 11/30/2014   Procedure: ESOPHAGEAL DILATION;  Surgeon: Taylor Houston, MD;  Location: [facility];  Service: Endoscopy;  Laterality: N/A;  Balloon, 15, 16.5  . ESOPHAGEAL DILATION N/A 06/03/2015   Procedure: ESOPHAGEAL DILATION WITH 16.5 MM BALLOON ;   Surgeon: Taylor Houston, MD;  Location: [facility];  Service: Endoscopy;  Laterality: N/A;  . ESOPHAGEAL DILATION N/A 09/09/2015   Procedure: ESOPHAGEAL DILATION;  Surgeon: Taylor Houston, MD;  Location: [facility];  Service: Endoscopy;  Laterality: N/A;  . ESOPHAGOGASTRODUODENOSCOPY N/A 11/11/2014   Procedure: ESOPHAGOGASTRODUODENOSCOPY (EGD);  Surgeon: Taylor Houston, MD;  [facility]: [facility];  Service: Endoscopy;  Laterality: N/A;  125-rescheduled 11/11/14 @ 10:30am Ann notified pt  . ESOPHAGOGASTRODUODENOSCOPY (EGD) WITH ESOPHAGEAL DILATION N/A 08/20/2013   Procedure: ESOPHAGOGASTRODUODENOSCOPY (EGD) WITH ESOPHAGEAL DILATION;  Surgeon: Taylor Houston, MD;  [facility]: [facility];  Service: Endoscopy;  Laterality: N/A;  345-moved to 730 Ann notified pt  . ESOPHAGOGASTRODUODENOSCOPY (EGD) WITH PROPOFOL N/A 07/24/2013   Procedure: ESOPHAGOGASTRODUODENOSCOPY (EGD) WITH PROPOFOL UNDER FLUOROSCOPY;  Surgeon: Taylor Houston, MD;  Location: [facility];  Service: Endoscopy;  Laterality: N/A;  . ESOPHAGOGASTRODUODENOSCOPY (EGD) WITH PROPOFOL N/A 11/30/2014   Procedure: ESOPHAGOGASTRODUODENOSCOPY (EGD) WITH PROPOFOL;  Surgeon: Taylor Houston, MD;  Location: [facility];  Service: Endoscopy;  Laterality: N/A;  fluoro not needed  . ESOPHAGOGASTRODUODENOSCOPY (EGD) WITH PROPOFOL N/A 06/03/2015   Procedure: ESOPHAGOGASTRODUODENOSCOPY (EGD) WITH PROPOFOL; HIATUS AT 35 CM AND GE JUNCTION 40 CM;  Surgeon: Taylor Houston, MD;  Location: [facility];  Service: Endoscopy;  Laterality: N/A;  . ESOPHAGOGASTRODUODENOSCOPY (EGD) WITH PROPOFOL N/A 09/09/2015   Procedure: ESOPHAGOGASTRODUODENOSCOPY (EGD) WITH PROPOFOL;  Surgeon: Taylor Houston, MD;  [facility]: [facility];  Service: Endoscopy;  Laterality: N/A;  8:15  . INTRAMEDULLARY (IM) NAIL INTERTROCHANTERIC Left 09/18/2015   Procedure: INTRAMEDULLARY (IM) NAIL INTERTROCHANTRIC;  Surgeon: Taylor Civil, MD;  Location: [facility];  Service: Orthopedics;  Laterality: Left;  .  TUBAL LIGATION      History reviewed. No pertinent family history. Social History:  reports that she has been smoking Cigarettes.  She has a 55.00 pack-year smoking history. She has never used smokeless tobacco. She reports that she drinks about 8.4 oz of alcohol per week . She reports that she does not use drugs.  Allergies:  Allergies  Allergen Reactions  . Other Nausea Only    Steroids    Medications Prior to Admission  Medication Sig Dispense Refill  . ALPRAZolam (XANAX) 0.25 MG tablet Take 1 tablet (0.25 mg total) by mouth 2 (two) times daily as needed for anxiety. 30 tablet 0  . aspirin EC 81 MG tablet Take 81 mg by mouth daily.    Marland Kitchen HYDROcodone-acetaminophen (NORCO/VICODIN) 5-325 MG tablet Take 1 tablet by mouth every 6 (six) hours as needed for moderate pain. 120 tablet 0  . megestrol (MEGACE) 40 MG tablet TK 1 T PO  BID TO IMPROVE APPETITE  0  . mirtazapine (REMERON) 30 MG tablet Take 30 mg by mouth at bedtime.     . NON FORMULARY Thrive - 1 package a day. Patient states that she takes 1/2 pack a day when she takes this.    Marland Kitchen omeprazole (PRILOSEC) 20 MG capsule Take 20 mg by mouth daily.    . Pediatric Multivitamins-Iron (FLINTSTONES PLUS IRON) chewable tablet Chew 1 tablet by mouth 2 (two) times daily.    . vitamin C (ASCORBIC ACID) 500 MG tablet Take 500 mg by mouth daily.      Results for orders placed or performed during the hospital encounter of 05/04/16 (from the past 48 hour(s))  I-STAT 4, (NA,K, GLUC, HGB,HCT)     Status: Abnormal   Collection Time: 05/04/16  8:27 AM  Result Value Ref Range   Sodium 137 135 - 145 mmol/L   Potassium 3.9 3.5 - 5.1 mmol/L   Glucose, Bld 93 65 - 99 mg/dL   HCT 45.0 36.0 - 46.0 %   Hemoglobin 15.3 (H) 12.0 - 15.0 g/dL   No results found.  ROS  Blood pressure 122/63, pulse 92, temperature 98.1 F (36.7 C), temperature source Oral, resp. rate (!) 24, height 5\' 8"  (1.727 m), weight 98 lb (44.5 kg), SpO2 93 %. Physical Exam   Constitutional:  Very thin emaciated Caucasian female in NAD.  HENT:  Mouth/Throat: Oropharynx is clear and moist.  Eyes: Conjunctivae are normal. No scleral icterus.  Neck: No thyromegaly present.  Cardiovascular: Normal rate, regular rhythm and normal heart sounds.   No murmur heard. Respiratory: Effort normal and breath sounds normal.  GI:  Abdomen is flat soft and nontender without organomegaly or masses.  Lymphadenopathy:    She has no cervical adenopathy.     Assessment/Plan Dysphagia secondary to high-grade esophageal stricture. History of iron deficiency anemia possibly secondary to impaired iron absorption. Thrombocytosis. She will need follow-up CBC in month. Hemoccult is negative. EGD with ED under monitored anesthesia care.  Hildred Laser, MD 05/04/2016, 9:20 AM

## 2016-05-04 NOTE — Anesthesia Procedure Notes (Signed)
Procedure Name: MAC Date/Time: 05/04/2016 9:34 AM Performed by: Vista Deck Pre-anesthesia Checklist: Patient identified, Emergency Drugs available, Suction available, Timeout performed and Patient being monitored Patient Re-evaluated:Patient Re-evaluated prior to inductionOxygen Delivery Method: Non-rebreather mask

## 2016-05-09 ENCOUNTER — Other Ambulatory Visit (INDEPENDENT_AMBULATORY_CARE_PROVIDER_SITE_OTHER): Payer: Self-pay | Admitting: Internal Medicine

## 2016-05-09 ENCOUNTER — Encounter (INDEPENDENT_AMBULATORY_CARE_PROVIDER_SITE_OTHER): Payer: Self-pay | Admitting: *Deleted

## 2016-05-09 DIAGNOSIS — K222 Esophageal obstruction: Secondary | ICD-10-CM

## 2016-05-11 ENCOUNTER — Encounter (HOSPITAL_COMMUNITY): Payer: Self-pay | Admitting: Internal Medicine

## 2016-06-18 NOTE — Patient Instructions (Signed)
Taylor Cannon  06/18/2016     @PREFPERIOPPHARMACY @   Your procedure is scheduled on  06/22/2016   Report to [facility] at  40  A.M.  Call this number if you have problems the morning of surgery:  [phone]   Remember:  Do not eat food or drink liquids after midnight.  Take these medicines the morning of surgery with A SIP OF WATER  Xanax, hydrocodone, remeron, prilosec.   Do not wear jewelry, make-up or nail polish.  Do not wear lotions, powders, or perfumes, or deoderant.  Do not shave 48 hours prior to surgery.  Men may shave face and neck.  Do not bring valuables to the hospital.  [facility] is not responsible for any belongings or valuables.  Contacts, dentures or bridgework may not be worn into surgery.  Leave your suitcase in the car.  After surgery it may be brought to your room.  For patients admitted to the hospital, discharge time will be determined by your treatment team.  Patients discharged the day of surgery will not be allowed to drive home.   Name and phone number of your driver:   family Special instructions:  Follow the diet instructions given to you by Dr Olevia Perches office.  Please read over the following fact sheets that you were given. Anesthesia Post-op Instructions and Care and Recovery After Surgery      Esophagogastroduodenoscopy Esophagogastroduodenoscopy (EGD) is a procedure that is used to examine the lining of the esophagus, stomach, and first part of the small intestine (duodenum). A long, flexible, lighted tube with a camera attached (endoscope) is inserted down the throat to view these organs. This procedure is done to detect problems or abnormalities, such as inflammation, bleeding, ulcers, or growths, in order to treat them. The procedure lasts 5-20 minutes. It is usually an outpatient procedure, but it may need to be performed in a hospital in emergency cases. LET [facility] CARE PROVIDER KNOW ABOUT:  Any  allergies you have.  All medicines you are taking, including vitamins, herbs, eye drops, creams, and over-the-counter medicines.  Previous problems you or members of your family have had with the use of anesthetics.  Any blood disorders you have.  Previous surgeries you have had.  Medical conditions you have. RISKS AND COMPLICATIONS Generally, this is a safe procedure. However, problems can occur and include:  Infection.  Bleeding.  Tearing (perforation) of the esophagus, stomach, or duodenum.  Difficulty breathing or not being able to breathe.  Excessive sweating.  Spasms of the larynx.  Slowed heartbeat.  Low blood pressure. BEFORE THE PROCEDURE  Do not eat or drink anything after midnight on the night before the procedure or as directed by your health care provider.  Do not take your regular medicines before the procedure if your health care provider asks you not to. Ask your health care provider about changing or stopping those medicines.  If you wear dentures, be prepared to remove them before the procedure.  Arrange for someone to drive you home after the procedure. PROCEDURE  A numbing medicine (local anesthetic) may be sprayed in your throat for comfort and to stop you from gagging or coughing.  You will have an IV tube inserted in a vein in your hand or arm. You will receive medicines and fluids through this tube.  You will be given a medicine to relax you (sedative).  A pain reliever will be given  through the IV tube.  A mouth guard may be placed in your mouth to protect your teeth and to keep you from biting on the endoscope.  You will be asked to lie on your left side.  The endoscope will be inserted down your throat and into your esophagus, stomach, and duodenum.  Air will be put through the endoscope to allow your health care provider to clearly view the lining of your esophagus.  The lining of your esophagus, stomach, and duodenum will be  examined. During the exam, your health care provider may:  Remove tissue to be examined under a microscope (biopsy) for inflammation, infection, or other medical problems.  Remove growths.  Remove objects (foreign bodies) that are stuck.  Treat any bleeding with medicines or other devices that stop tissues from bleeding (hot cautery, clipping devices).  Widen (dilate) or stretch narrowed areas of your esophagus and stomach.  The endoscope will be withdrawn. AFTER THE PROCEDURE  You will be taken to a recovery area for observation. Your blood pressure, heart rate, breathing rate, and blood oxygen level will be monitored often until the medicines you were given have worn off.  Do not eat or drink anything until the numbing medicine has worn off and your gag reflex has returned. You may choke.  Your health care provider should be able to discuss his or her findings with you. It will take longer to discuss the test results if any biopsies were taken.   This information is not intended to replace advice given to you by your health care provider. Make sure you discuss any questions you have with your health care provider.   Document Released: 12/21/2004 Document Revised: 09/10/2014 Document Reviewed: 07/23/2012 Elsevier Interactive Patient Education 2016 [employer]  Esophageal Dilatation Esophageal dilatation is a procedure to open a blocked or narrowed part of the esophagus. The esophagus is the long tube in your throat that carries food and liquid from your mouth to your stomach. The procedure is also called esophageal dilation.  You may need this procedure if you have a buildup of scar tissue in your esophagus that makes it difficult, painful, or even impossible to swallow. This can be caused by gastroesophageal reflux disease (GERD). In rare cases, people need this procedure because they have cancer of the esophagus or a problem with the way food moves through the esophagus. Sometimes  you may need to have another dilatation to enlarge the opening of the esophagus gradually. LET [facility] CARE PROVIDER KNOW ABOUT:   Any allergies you have.  All medicines you are taking, including vitamins, herbs, eye drops, creams, and over-the-counter medicines.  Previous problems you or members of your family have had with the use of anesthetics.  Any blood disorders you have.  Previous surgeries you have had.  Medical conditions you have.  Any antibiotic medicines you are required to take before dental procedures. RISKS AND COMPLICATIONS Generally, this is a safe procedure. However, problems can occur and include:  Bleeding from a tear in the lining of the esophagus.  A hole (perforation) in the esophagus. BEFORE THE PROCEDURE  Do not eat or drink anything after midnight on the night before the procedure or as directed by your health care provider.  Ask your health care provider about changing or stopping your regular medicines. This is especially important if you are taking diabetes medicines or blood thinners.  Plan to have someone take you home after the procedure. PROCEDURE   You will be given  a medicine that makes you relaxed and sleepy (sedative).  A medicine may be sprayed or gargled to numb the back of the throat.  Your health care provider can use various instruments to do an esophageal dilatation. During the procedure, the instrument used will be placed in your mouth and passed down into your esophagus. Options include:  Simple dilators. This instrument is carefully placed in the esophagus to stretch it.  Guided wire bougies. In this method, a flexible tube (endoscope) is used to insert a wire into the esophagus. The dilator is passed over this wire to enlarge the esophagus. Then the wire is removed.  Balloon dilators. An endoscope with a small balloon at the end is passed down into the esophagus. Inflating the balloon gently stretches the esophagus and opens  it up. AFTER THE PROCEDURE  Your blood pressure, heart rate, breathing rate, and blood oxygen level will be monitored often until the medicines you were given have worn off.  Your throat may feel slightly sore and will probably still feel numb. This will improve slowly over time.  You will not be allowed to eat or drink until the throat numbness has resolved.  If this is a same-day procedure, you may be allowed to go home once you have been able to drink, urinate, and sit on the edge of the bed without nausea or dizziness.  If this is a same-day procedure, you should have a friend or family member with you for the next 24 hours after the procedure.   This information is not intended to replace advice given to you by your health care provider. Make sure you discuss any questions you have with your health care provider.   Document Released: 10/11/2005 Document Revised: 09/10/2014 Document Reviewed: 12/30/2013 Elsevier Interactive Patient Education 2016 [place]. Esophagogastroduodenoscopy, Care After Refer to this sheet in the next few weeks. These instructions provide you with information about caring for yourself after your procedure. Your health care provider may also give you more specific instructions. Your treatment has been planned according to current medical practices, but problems sometimes occur. Call your health care provider if you have any problems or questions after your procedure. WHAT TO EXPECT AFTER THE PROCEDURE After your procedure, it is typical to feel:  Soreness in your throat.  Pain with swallowing.  Sick to your stomach (nauseous).  Bloated.  Dizzy.  Fatigued. HOME CARE INSTRUCTIONS  Do not eat or drink anything until the numbing medicine (local anesthetic) has worn off and your gag reflex has returned. You will know that the local anesthetic has worn off when you can swallow comfortably.  Do not drive or operate machinery until directed by your health  care provider.  Take medicines only as directed by your health care provider. SEEK MEDICAL CARE IF:   You cannot stop coughing.  You are not urinating at all or less than usual. SEEK IMMEDIATE MEDICAL CARE IF:  You have difficulty swallowing.  You cannot eat or drink.  You have worsening throat or chest pain.  You have dizziness or lightheadedness or you faint.  You have nausea or vomiting.  You have chills.  You have a fever.  You have severe abdominal pain.  You have black, tarry, or bloody stools.   This information is not intended to replace advice given to you by your health care provider. Make sure you discuss any questions you have with your health care provider.   Document Released: 08/06/2012 Document Revised: 09/10/2014 Document Reviewed: 08/06/2012  Elsevier Interactive Patient Education 2016 [place] Monitored anesthesia care is an anesthesia service for a medical procedure. Anesthesia is the loss of the ability to feel pain. It is produced by medicines called anesthetics. It may affect a small area of your body (local anesthesia), a large area of your body (regional anesthesia), or your entire body (general anesthesia). The need for monitored anesthesia care depends your procedure, your condition, and the potential need for regional or general anesthesia. It is often provided during procedures where:   General anesthesia may be needed if there are complications. This is because you need special care when you are under general anesthesia.   You will be under local or regional anesthesia. This is so that you are able to have higher levels of anesthesia if needed.   You will receive calming medicines (sedatives). This is especially the case if sedatives are given to put you in a semi-conscious state of relaxation (deep sedation). This is because the amount of sedative needed to produce this state can be hard to predict. Too much of a  sedative can produce general anesthesia. Monitored anesthesia care is performed by one or more health care providers who have special training in all types of anesthesia. You will need to meet with these health care providers before your procedure. During this meeting, they will ask you about your medical history. They will also give you instructions to follow. (For example, you will need to stop eating and drinking before your procedure. You may also need to stop or change medicines you are taking.) During your procedure, your health care providers will stay with you. They will:   Watch your condition. This includes watching your blood pressure, breathing, and level of pain.   Diagnose and treat problems that occur.   Give medicines if they are needed. These may include calming medicines (sedatives) and anesthetics.   Make sure you are comfortable.  Having monitored anesthesia care does not necessarily mean that you will be under anesthesia. It does mean that your health care providers will be able to manage anesthesia if you need it or if it occurs. It also means that you will be able to have a different type of anesthesia than you are having if you need it. When your procedure is complete, your health care providers will continue to watch your condition. They will make sure any medicines wear off before you are allowed to go home.    This information is not intended to replace advice given to you by your health care provider. Make sure you discuss any questions you have with your health care provider.   Document Released: 05/16/2005 Document Revised: 09/10/2014 Document Reviewed: 10/01/2012 Elsevier Interactive Patient Education 2016 [employer] Inc[employer] PATIENT INSTRUCTIONS POST-ANESTHESIA  IMMEDIATELY FOLLOWING SURGERY:  Do not drive or operate machinery for the first twenty four hours after surgery.  Do not make any important decisions for twenty four hours after surgery or while taking  narcotic pain medications or sedatives.  If you develop intractable nausea and vomiting or a severe headache please notify your doctor immediately.  FOLLOW-UP:  Please make an appointment with your surgeon as instructed. You do not need to follow up with anesthesia unless specifically instructed to do so.  WOUND CARE INSTRUCTIONS (if applicable):  Keep a dry clean dressing on the anesthesia/puncture wound site if there is drainage.  Once the wound has quit draining you may leave it open to air.  Generally you should leave  the bandage intact for twenty four hours unless there is drainage.  If the epidural site drains for more than 36-48 hours please call the anesthesia department.  QUESTIONS?:  Please feel free to call your physician or the hospital operator if you have any questions, and they will be happy to assist you.

## 2016-06-19 ENCOUNTER — Encounter (HOSPITAL_COMMUNITY)
Admission: RE | Admit: 2016-06-19 | Discharge: 2016-06-19 | Disposition: A | Discharge status: Home / Self Care | Payer: Medicare HMO | Source: Ambulatory Visit | Attending: Internal Medicine | Admitting: Internal Medicine

## 2016-06-19 DIAGNOSIS — J449 Chronic obstructive pulmonary disease, unspecified: Secondary | ICD-10-CM | POA: Diagnosis not present

## 2016-06-19 DIAGNOSIS — F329 Major depressive disorder, single episode, unspecified: Secondary | ICD-10-CM | POA: Diagnosis not present

## 2016-06-19 DIAGNOSIS — F17210 Nicotine dependence, cigarettes, uncomplicated: Secondary | ICD-10-CM | POA: Diagnosis not present

## 2016-06-19 DIAGNOSIS — Z9981 Dependence on supplemental oxygen: Secondary | ICD-10-CM | POA: Diagnosis not present

## 2016-06-19 DIAGNOSIS — Z8673 Personal history of transient ischemic attack (TIA), and cerebral infarction without residual deficits: Secondary | ICD-10-CM | POA: Diagnosis not present

## 2016-06-19 DIAGNOSIS — K222 Esophageal obstruction: Secondary | ICD-10-CM

## 2016-06-19 DIAGNOSIS — F419 Anxiety disorder, unspecified: Secondary | ICD-10-CM | POA: Diagnosis not present

## 2016-06-19 DIAGNOSIS — K449 Diaphragmatic hernia without obstruction or gangrene: Secondary | ICD-10-CM | POA: Diagnosis not present

## 2016-06-19 DIAGNOSIS — Z7982 Long term (current) use of aspirin: Secondary | ICD-10-CM | POA: Diagnosis not present

## 2016-06-19 DIAGNOSIS — K219 Gastro-esophageal reflux disease without esophagitis: Secondary | ICD-10-CM | POA: Diagnosis not present

## 2016-06-19 LAB — BASIC METABOLIC PANEL
Anion gap: 7 (ref 5–15)
BUN: 6 mg/dL (ref 6–20)
CO2: 27 mmol/L (ref 22–32)
Calcium: 9.2 mg/dL (ref 8.9–10.3)
Chloride: 97 mmol/L — ABNORMAL LOW (ref 101–111)
Creatinine, Ser: 0.8 mg/dL (ref 0.44–1.00)
GFR calc Af Amer: >60 mL/min (ref >60)
GFR calc non Af Amer: >60 mL/min (ref >60)
Glucose, Bld: 107 mg/dL — ABNORMAL HIGH (ref 65–99)
Potassium: 4.7 mmol/L (ref 3.5–5.1)
Sodium: 131 mmol/L — ABNORMAL LOW (ref 135–145)

## 2016-06-19 LAB — CBC WITH DIFFERENTIAL/PLATELET
Basophils Absolute: 0 10*3/uL (ref 0.0–0.1)
Basophils Relative: 0 %
Eosinophils Absolute: 0.1 10*3/uL (ref 0.0–0.7)
Eosinophils Relative: 2 %
HCT: 47.2 % — ABNORMAL HIGH (ref 36.0–46.0)
Hemoglobin: 16 g/dL — ABNORMAL HIGH (ref 12.0–15.0)
Lymphocytes Relative: 8 %
Lymphs Abs: 0.8 10*3/uL (ref 0.7–4.0)
MCH: 28.4 pg (ref 26.0–34.0)
MCHC: 33.9 g/dL (ref 30.0–36.0)
MCV: 83.7 fL (ref 78.0–100.0)
Monocytes Absolute: 1 10*3/uL (ref 0.1–1.0)
Monocytes Relative: 10 %
Neutro Abs: 7.5 10*3/uL (ref 1.7–7.7)
Neutrophils Relative %: 80 %
Platelets: 629 10*3/uL — ABNORMAL HIGH (ref 150–400)
RBC: 5.64 MIL/uL — ABNORMAL HIGH (ref 3.87–5.11)
RDW: 18.7 % — ABNORMAL HIGH (ref 11.5–15.5)
WBC: 9.4 10*3/uL (ref 4.0–10.5)

## 2016-06-22 ENCOUNTER — Ambulatory Visit (HOSPITAL_COMMUNITY): Payer: Medicare HMO | Admitting: Anesthesiology

## 2016-06-22 ENCOUNTER — Encounter (HOSPITAL_COMMUNITY): Payer: Self-pay | Admitting: *Deleted

## 2016-06-22 ENCOUNTER — Encounter (HOSPITAL_COMMUNITY)
Admission: RE | Disposition: A | Discharge status: Home / Self Care | Payer: Self-pay | Source: Ambulatory Visit | Attending: Internal Medicine

## 2016-06-22 ENCOUNTER — Ambulatory Visit (HOSPITAL_COMMUNITY)
Admission: RE | Admit: 2016-06-22 | Discharge: 2016-06-22 | Disposition: A | Discharge status: Home / Self Care | Payer: Medicare HMO | Source: Ambulatory Visit | Attending: Internal Medicine | Admitting: Internal Medicine

## 2016-06-22 DIAGNOSIS — K222 Esophageal obstruction: Secondary | ICD-10-CM | POA: Diagnosis not present

## 2016-06-22 DIAGNOSIS — F419 Anxiety disorder, unspecified: Secondary | ICD-10-CM | POA: Insufficient documentation

## 2016-06-22 DIAGNOSIS — K219 Gastro-esophageal reflux disease without esophagitis: Secondary | ICD-10-CM | POA: Insufficient documentation

## 2016-06-22 DIAGNOSIS — F17210 Nicotine dependence, cigarettes, uncomplicated: Secondary | ICD-10-CM | POA: Insufficient documentation

## 2016-06-22 DIAGNOSIS — Z7982 Long term (current) use of aspirin: Secondary | ICD-10-CM | POA: Insufficient documentation

## 2016-06-22 DIAGNOSIS — F329 Major depressive disorder, single episode, unspecified: Secondary | ICD-10-CM | POA: Insufficient documentation

## 2016-06-22 DIAGNOSIS — K449 Diaphragmatic hernia without obstruction or gangrene: Secondary | ICD-10-CM | POA: Diagnosis not present

## 2016-06-22 DIAGNOSIS — J449 Chronic obstructive pulmonary disease, unspecified: Secondary | ICD-10-CM | POA: Insufficient documentation

## 2016-06-22 DIAGNOSIS — Z8673 Personal history of transient ischemic attack (TIA), and cerebral infarction without residual deficits: Secondary | ICD-10-CM | POA: Insufficient documentation

## 2016-06-22 DIAGNOSIS — Z9981 Dependence on supplemental oxygen: Secondary | ICD-10-CM | POA: Insufficient documentation

## 2016-06-22 HISTORY — PX: ESOPHAGOGASTRODUODENOSCOPY (EGD) WITH PROPOFOL: SHX5813

## 2016-06-22 HISTORY — PX: ESOPHAGEAL DILATION: SHX303

## 2016-06-22 SURGERY — ESOPHAGOGASTRODUODENOSCOPY (EGD) WITH PROPOFOL
Anesthesia: Monitor Anesthesia Care

## 2016-06-22 MED ORDER — STERILE WATER FOR IRRIGATION IR SOLN
Status: DC | PRN
  Administered 2016-06-22: 100 mL

## 2016-06-22 MED ORDER — CHLORHEXIDINE GLUCONATE CLOTH 2 % EX PADS: 6.0000 | MEDICATED_PAD | Freq: Once | CUTANEOUS | Status: DC

## 2016-06-22 MED ORDER — PROPOFOL 10 MG/ML IV BOLUS
INTRAVENOUS | Status: DC | PRN
  Administered 2016-06-22 (×3): 1 mg via INTRAVENOUS

## 2016-06-22 MED ORDER — LACTATED RINGERS IV SOLN
Freq: Once | INTRAVENOUS | Status: AC
  Administered 2016-06-22: 10:00:00 via INTRAVENOUS

## 2016-06-22 MED ORDER — MIDAZOLAM HCL 5 MG/5ML IJ SOLN
INTRAMUSCULAR | Status: DC | PRN
  Administered 2016-06-22: 0.5 mg via INTRAVENOUS

## 2016-06-22 MED ORDER — PROPOFOL 500 MG/50ML IV EMUL
INTRAVENOUS | Status: DC | PRN
  Administered 2016-06-22: 1250 ug/kg/min via INTRAVENOUS

## 2016-06-22 MED ORDER — BUTAMBEN-TETRACAINE-BENZOCAINE 2-2-14 % EX AERO
2.0000 | INHALATION_SPRAY | Freq: Once | CUTANEOUS | Status: AC
  Administered 2016-06-22: 2 via TOPICAL

## 2016-06-22 MED ORDER — OXYCODONE HCL 5 MG/5ML PO SOLN
5.0000 mg | Freq: Once | ORAL | Status: DC | PRN
  Filled 2016-06-22: qty 5

## 2016-06-22 MED ORDER — LACTATED RINGERS IV SOLN
INTRAVENOUS | Status: DC | PRN
  Administered 2016-06-22: 12:00:00 via INTRAVENOUS

## 2016-06-22 MED ORDER — MIDAZOLAM HCL 2 MG/2ML IJ SOLN
INTRAMUSCULAR | Status: AC
  Filled 2016-06-22: qty 2

## 2016-06-22 MED ORDER — ONDANSETRON HCL 4 MG/2ML IJ SOLN: 4.0000 mg | Freq: Four times a day (QID) | INTRAMUSCULAR | Status: DC | PRN

## 2016-06-22 MED ORDER — OXYCODONE HCL 5 MG PO TABS: 5.0000 mg | ORAL_TABLET | Freq: Once | ORAL | Status: DC | PRN

## 2016-06-22 MED ORDER — FENTANYL CITRATE (PF) 100 MCG/2ML IJ SOLN: 25.0000 ug | INTRAMUSCULAR | Status: DC | PRN

## 2016-06-22 MED ORDER — PROPOFOL 10 MG/ML IV BOLUS
INTRAVENOUS | Status: AC
  Filled 2016-06-22: qty 40

## 2016-06-22 NOTE — Op Note (Signed)
[facility] Patient Name: Taylor Cannon Procedure Date: 06/22/2016 12:03 PM MRN: [id number] Date of Birth: [birth year] Attending MD: Hildred Laser , MD CSN: [id number] Age: 82 Admit Type: Outpatient Procedure:                Upper GI endoscopy Indications:              For therapy of esophageal stricture Providers:                Hildred Laser, MD, Tammy Vaught, RN, Purcell Nails.                            [place], Technician, Cleda Clarks, Technologist Referring MD:             Elyn Aquas MD, MD Medicines:                Cetacaine spray, Propofol per Anesthesia Complications:            No immediate complications. Estimated Blood Loss:     Estimated blood loss was minimal. Procedure:                Pre-Anesthesia Assessment:                           - Prior to the procedure, a History and Physical                            was performed, and patient medications and                            allergies were reviewed. The patient's tolerance of                            previous anesthesia was also reviewed. The risks                            and benefits of the procedure and the sedation                            options and risks were discussed with the patient.                            All questions were answered, and informed consent                            was obtained. Prior Anticoagulants: The patient                            last took aspirin 3 days prior to the procedure.                            ASA Grade Assessment: III - A patient with severe                            systemic disease. After reviewing the risks and  benefits, the patient was deemed in satisfactory                            condition to undergo the procedure.                           After obtaining informed consent, the endoscope was                            passed under direct vision. Throughout the                            procedure, the patient's blood  pressure, pulse, and                            oxygen saturations were monitored continuously. The                            [id number]) scope was introduced through the                            mouth, and advanced to the second part of duodenum.                            The upper GI endoscopy was accomplished without                            difficulty. The patient tolerated the procedure                            well. Scope In: 12:28:04 PM Scope Out: 12:40:23 PM Total Procedure Duration: 0 hours 12 minutes 19 seconds  Findings:      The examined esophagus was normal.      One severe benign-appearing, intrinsic stenosis was found 33 cm from the       incisors. This measured 1 cm (inner diameter) x less than one cm (in       length) and was traversed after dilation. A TTS dilator was passed       through the scope. Dilation with a 15-16.5-18 mm balloon dilator was       performed to 15 mm and 16.5 mm. The dilation site was examined and       showed moderate improvement in luminal narrowing.      The Z-line was regular and was found 32 cm from the incisors.      A 7 cm hiatal hernia was present.      The entire examined stomach was normal.      The duodenal bulb and second portion of the duodenum were normal. Impression:               - Normal esophagus.                           - Benign-appearing high grade esophageal stenosis.  Dilated to 16.5 mm                           - Z-line regular, 32 cm from the incisors.                           - 7 cm hiatal hernia.                           - Normal stomach.                           - Normal duodenal bulb and second portion of the                            duodenum.                           - No specimens collected. Moderate Sedation:      Per Anesthesia Care Recommendation:           - Patient has a contact number available for                            emergencies. The signs and  symptoms of potential                            delayed complications were discussed with the                            patient. Return to normal activities tomorrow.                            Written discharge instructions were provided to the                            patient.                           - Resume previous diet today.                           - Continue present medications.                           - No aspirin, ibuprofen, naproxen, or other                            non-steroidal anti-inflammatory drugs for 3 days.                           - Return to GI clinic in 3 months. Procedure Code(s):        --- Professional ---                           [phone], Esophagogastroduodenoscopy, flexible,  transoral; with transendoscopic balloon dilation of                            esophagus (less than 30 mm diameter) Diagnosis Code(s):        --- Professional ---                           K22.2, Esophageal obstruction                           K44.9, Diaphragmatic hernia without obstruction or                            gangrene CPT copyright 2016 [employer]. All rights reserved. The codes documented in this report are preliminary and upon coder review may  be revised to meet current compliance requirements. Hildred Laser, MD Hildred Laser, MD 06/22/2016 12:49:00 PM This report has been signed electronically. Number of Addenda: 0

## 2016-06-22 NOTE — H&P (Signed)
Taylor Cannon is an 82 y.o. female.   Chief Complaint: Patient is here for EGD and ED. HPI: Patient is 82 year old Caucasian female with multiple medical problems was chronic GERD complicated by esophageal stricture which has been difficult to keep open. This stricture was high-grade to begin with. Last dilation was in 05/04/2016 when the stricture was dilated from 12-15 mm. She is returning for repeat dilation. Goal is to dilate to 18 mm if possible. She states her swallowing is near normal. Heartburns well controlled with PPI. She does not feel she has lost any more weight. She continues to smoke usually half to 1 pack per day.  Past Medical History:  Diagnosis Date  . Anxiety   . COPD (chronic obstructive pulmonary disease) ([place])   . Depression   . GERD (gastroesophageal reflux disease)   . On home oxygen therapy    2L via [place] at night  . TIA (transient ischemic attack)     Past Surgical History:  Procedure Laterality Date  . APPENDECTOMY    . BALLOON DILATION N/A 07/24/2013   Procedure: BALLOON DILATION to 13.72mm;  Surgeon: Rogene Houston, MD;  Location: [facility];  Service: Endoscopy;  Laterality: N/A;  . BALLOON DILATION N/A 11/11/2014   Procedure: BALLOON DILATION;  Surgeon: Rogene Houston, MD;  Location: [facility];  Service: Endoscopy;  Laterality: N/A;  . CARDIAC ELECTROPHYSIOLOGY STUDY AND ABLATION    . CATARACT EXTRACTION Bilateral   . CERVICAL CONE BIOPSY    . ESOPHAGEAL DILATION N/A 11/30/2014   Procedure: ESOPHAGEAL DILATION;  Surgeon: Rogene Houston, MD;  Location: [facility];  Service: Endoscopy;  Laterality: N/A;  Balloon, 15, 16.5  . ESOPHAGEAL DILATION N/A 06/03/2015   Procedure: ESOPHAGEAL DILATION WITH 16.5 MM BALLOON ;  Surgeon: Rogene Houston, MD;  Location: [facility];  Service: Endoscopy;  Laterality: N/A;  . ESOPHAGEAL DILATION N/A 09/09/2015   Procedure: ESOPHAGEAL DILATION;  Surgeon: Rogene Houston, MD;  [facility];  Service: Endoscopy;  Laterality:  N/A;  . ESOPHAGEAL DILATION N/A 05/04/2016   Procedure: ESOPHAGEAL DILATION;  Surgeon: Rogene Houston, MD;  [facility];  Service: Endoscopy;  Laterality: N/A;  . ESOPHAGOGASTRODUODENOSCOPY N/A 11/11/2014   Procedure: ESOPHAGOGASTRODUODENOSCOPY (EGD);  Surgeon: Rogene Houston, MD;  [facility];  Service: Endoscopy;  Laterality: N/A;  125-rescheduled 11/11/14 @ 10:30am Ann notified pt  . ESOPHAGOGASTRODUODENOSCOPY (EGD) WITH ESOPHAGEAL DILATION N/A 08/20/2013   Procedure: ESOPHAGOGASTRODUODENOSCOPY (EGD) WITH ESOPHAGEAL DILATION;  Surgeon: Rogene Houston, MD;  [facility];  Service: Endoscopy;  Laterality: N/A;  345-moved to 730 Ann notified pt  . ESOPHAGOGASTRODUODENOSCOPY (EGD) WITH PROPOFOL N/A 07/24/2013   Procedure: ESOPHAGOGASTRODUODENOSCOPY (EGD) WITH PROPOFOL UNDER FLUOROSCOPY;  Surgeon: Rogene Houston, MD;  Location: [facility];  Service: Endoscopy;  Laterality: N/A;  . ESOPHAGOGASTRODUODENOSCOPY (EGD) WITH PROPOFOL N/A 11/30/2014   Procedure: ESOPHAGOGASTRODUODENOSCOPY (EGD) WITH PROPOFOL;  Surgeon: Rogene Houston, MD;  Location: [facility];  Service: Endoscopy;  Laterality: N/A;  fluoro not needed  . ESOPHAGOGASTRODUODENOSCOPY (EGD) WITH PROPOFOL N/A 06/03/2015   Procedure: ESOPHAGOGASTRODUODENOSCOPY (EGD) WITH PROPOFOL; HIATUS AT 35 CM AND GE JUNCTION 40 CM;  Surgeon: Rogene Houston, MD;  Location: [facility];  Service: Endoscopy;  Laterality: N/A;  . ESOPHAGOGASTRODUODENOSCOPY (EGD) WITH PROPOFOL N/A 09/09/2015   Procedure: ESOPHAGOGASTRODUODENOSCOPY (EGD) WITH PROPOFOL;  Surgeon: Rogene Houston, MD;  [facility];  Service: Endoscopy;  Laterality: N/A;  8:15  . ESOPHAGOGASTRODUODENOSCOPY (EGD) WITH  PROPOFOL N/A 05/04/2016   Procedure: ESOPHAGOGASTRODUODENOSCOPY (EGD) WITH PROPOFOL;  Surgeon: Rogene Houston, MD;  [facility];  Service: Endoscopy;  Laterality: N/A;  9:30  . INTRAMEDULLARY (IM) NAIL INTERTROCHANTERIC Left 09/18/2015   Procedure:  INTRAMEDULLARY (IM) NAIL INTERTROCHANTRIC;  Surgeon: Carole Civil, MD;  Location: [facility];  Service: Orthopedics;  Laterality: Left;  . TUBAL LIGATION      History reviewed. No pertinent family history. Social History:  reports that she has been smoking Cigarettes.  She has a 55.00 pack-year smoking history. She has never used smokeless tobacco. She reports that she drinks about 8.4 oz of alcohol per week . She reports that she does not use drugs.  Allergies:  Allergies  Allergen Reactions  . Other Nausea Only    Steroids    Medications Prior to Admission  Medication Sig Dispense Refill  . ALPRAZolam (XANAX) 0.25 MG tablet Take 1 tablet (0.25 mg total) by mouth 2 (two) times daily as needed for anxiety. (Patient taking differently: Take 0.125-0.25 mg by mouth 2 (two) times daily as needed for anxiety or sleep. ) 30 tablet 0  . aspirin EC 81 MG tablet Take 1 tablet (81 mg total) by mouth daily.    . calcium carbonate (OSCAL) 1500 (600 Ca) MG TABS tablet Take 600 mg of elemental calcium by mouth daily.    . Iron-FA-B Cmp-C-Biot-Probiotic (FUSION PLUS) CAPS Take 1 capsule by mouth daily.  2  . megestrol (MEGACE) 40 MG tablet TAKE 1 TABLET (40 MG) BY MOUTH TWICE DAILY TO IMPROVE APPETITE  0  . mirtazapine (REMERON) 30 MG tablet Take 30 mg by mouth at bedtime as needed.     Marland Kitchen omeprazole (PRILOSEC) 20 MG capsule Take 20 mg by mouth daily.    . Pediatric Multivitamins-Iron (FLINTSTONES PLUS IRON) chewable tablet Chew 1 tablet by mouth 2 (two) times daily. (Patient taking differently: Chew 2 tablets by mouth daily. )    . vitamin C (ASCORBIC ACID) 500 MG tablet Take 500 mg by mouth daily.    Marland Kitchen HYDROcodone-acetaminophen (NORCO/VICODIN) 5-325 MG tablet Take 1 tablet by mouth every 6 (six) hours as needed for moderate pain. (Patient not taking: Reported on 06/12/2016) 120 tablet 0    No results found for this or any previous visit (from the past 48 hour(s)). No results found.  ROS  Blood  pressure 139/64, pulse 84, temperature 98.7 F (37.1 C), temperature source Oral, resp. rate (!) 31, height 5\' 8"  (1.727 m), weight 98 lb (44.5 kg), SpO2 98 %. Physical Exam  Constitutional:  Thin Caucasian female in NAD. Generalized muscle wasting.  HENT:  Mouth/Throat: Oropharynx is clear and moist.  Patient is edentulous.  Eyes: Conjunctivae are normal. No scleral icterus.  Neck: No thyromegaly present.  Cardiovascular: Normal rate, regular rhythm and normal heart sounds.   No murmur heard. Respiratory: Effort normal and breath sounds normal.  GI: Soft. She exhibits no distension and no mass.  Musculoskeletal: She exhibits no edema.  Lymphadenopathy:    She has no cervical adenopathy.  Neurological: She is alert.  Skin: Skin is warm and dry.     Assessment/Plan High-grade distal esophageal stricture secondary to chronic GERD. EGD with ED under monitored anesthesia care.  Hildred Laser, MD 06/22/2016, 12:12 PM

## 2016-06-22 NOTE — Discharge Instructions (Signed)
Resume aspirin on 06/26/2016. Resume other medications and diet as before. No driving for 24 hours. Office visit in 3 months. Call if you develop swallowing difficulty. Gastrointestinal Endoscopy, Care After Refer to this sheet in the next few weeks. These instructions provide you with information on caring for yourself after your procedure. Your caregiver may also give you more specific instructions. Your treatment has been planned according to current medical practices, but problems sometimes occur. Call your caregiver if you have any problems or questions after your procedure. HOME CARE INSTRUCTIONS  If you were given medicine to help you relax (sedative), do not drive, operate machinery, or sign important documents for 24 hours.  Avoid alcohol and hot or warm beverages for the first 24 hours after the procedure.  Only take over-the-counter or prescription medicines for pain, discomfort, or fever as directed by your caregiver. You may resume taking your normal medicines unless your caregiver tells you otherwise. Ask your caregiver when you may resume taking medicines that may cause bleeding, such as aspirin, clopidogrel, or warfarin.  You may return to your normal diet and activities on the day after your procedure, or as directed by your caregiver. Walking may help to reduce any bloated feeling in your abdomen.  Drink enough fluids to keep your urine clear or pale yellow.  You may gargle with salt water if you have a sore throat. SEEK IMMEDIATE MEDICAL CARE IF:  You have severe nausea or vomiting.  You have severe abdominal pain, abdominal cramps that last longer than 6 hours, or abdominal swelling (distention).  You have severe shoulder or back pain.  You have trouble swallowing.  You have shortness of breath, your breathing is shallow, or you are breathing faster than normal.  You have a fever or a rapid heartbeat.  You vomit blood or material that looks like coffee  grounds.  You have bloody, black, or tarry stools. MAKE SURE YOU:  Understand these instructions.  Will watch your condition.  Will get help right away if you are not doing well or get worse.   This information is not intended to replace advice given to you by your health care provider. Make sure you discuss any questions you have with your health care provider.   Document Released: 04/03/2004 Document Revised: 09/10/2014 Document Reviewed: 11/20/2011 Elsevier Interactive Patient Education 2016 Else[employer]  Esophageal Dilatation Esophageal dilatation is a procedure to open a blocked or narrowed part of the esophagus. The esophagus is the long tube in your throat that carries food and liquid from your mouth to your stomach. The procedure is also called esophageal dilation.  You may need this procedure if you have a buildup of scar tissue in your esophagus that makes it difficult, painful, or even impossible to swallow. This can be caused by gastroesophageal reflux disease (GERD). In rare cases, people need this procedure because they have cancer of the esophagus or a problem with the way food moves through the esophagus. Sometimes you may need to have another dilatation to enlarge the opening of the esophagus gradually. LET [facility] CARE PROVIDER KNOW ABOUT:   Any allergies you have.  All medicines you are taking, including vitamins, herbs, eye drops, creams, and over-the-counter medicines.  Previous problems you or members of your family have had with the use of anesthetics.  Any blood disorders you have.  Previous surgeries you have had.  Medical conditions you have.  Any antibiotic medicines you are required to take before dental procedures. RISKS AND  COMPLICATIONS Generally, this is a safe procedure. However, problems can occur and include:  Bleeding from a tear in the lining of the esophagus.  A hole (perforation) in the esophagus. BEFORE THE PROCEDURE  Do not eat  or drink anything after midnight on the night before the procedure or as directed by your health care provider.  Ask your health care provider about changing or stopping your regular medicines. This is especially important if you are taking diabetes medicines or blood thinners.  Plan to have someone take you home after the procedure. PROCEDURE   You will be given a medicine that makes you relaxed and sleepy (sedative).  A medicine may be sprayed or gargled to numb the back of the throat.  Your health care provider can use various instruments to do an esophageal dilatation. During the procedure, the instrument used will be placed in your mouth and passed down into your esophagus. Options include:  Simple dilators. This instrument is carefully placed in the esophagus to stretch it.  Guided wire bougies. In this method, a flexible tube (endoscope) is used to insert a wire into the esophagus. The dilator is passed over this wire to enlarge the esophagus. Then the wire is removed.  Balloon dilators. An endoscope with a small balloon at the end is passed down into the esophagus. Inflating the balloon gently stretches the esophagus and opens it up. AFTER THE PROCEDURE  Your blood pressure, heart rate, breathing rate, and blood oxygen level will be monitored often until the medicines you were given have worn off.  Your throat may feel slightly sore and will probably still feel numb. This will improve slowly over time.  You will not be allowed to eat or drink until the throat numbness has resolved.  If this is a same-day procedure, you may be allowed to go home once you have been able to drink, urinate, and sit on the edge of the bed without nausea or dizziness.  If this is a same-day procedure, you should have a friend or family member with you for the next 24 hours after the procedure.   This information is not intended to replace advice given to you by your health care provider. Make sure you  discuss any questions you have with your health care provider.   Document Released: 10/11/2005 Document Revised: 09/10/2014 Document Reviewed: 12/30/2013 Elsevier Interactive Patient Education [employer].

## 2016-06-22 NOTE — Anesthesia Postprocedure Evaluation (Signed)
Anesthesia Post Note  Patient: Taylor Cannon  Procedure(s) Performed: Procedure(s) (LRB): ESOPHAGOGASTRODUODENOSCOPY (EGD) WITH PROPOFOL (N/A) ESOPHAGEAL DILATION (N/A)  Patient location during evaluation: PACU Anesthesia Type: MAC Level of consciousness: awake and alert Pain management: pain level controlled Vital Signs Assessment: post-procedure vital signs reviewed and stable Respiratory status: spontaneous breathing, nonlabored ventilation, respiratory function stable and patient connected to nasal cannula oxygen Cardiovascular status: stable and blood pressure returned to baseline Anesthetic complications: no    Last Vitals:  Vitals:   06/22/16 1300 06/22/16 1311  BP: 134/81 (!) 142/72  Pulse: 81 85  Resp: 19 18  Temp:  36.8 C    Last Pain:  Vitals:   06/22/16 1311  TempSrc: Oral                 Masiel Gentzler S

## 2016-06-22 NOTE — Anesthesia Preprocedure Evaluation (Signed)
Anesthesia Evaluation  Patient identified by MRN, date of birth, ID band Patient awake    Reviewed: Allergy & Precautions, NPO status , Patient's Chart, lab work & pertinent test results  Airway Mallampati: II   Neck ROM: full    Dental   Pulmonary COPD,  oxygen dependent, Current Smoker,    breath sounds clear to auscultation       Cardiovascular negative cardio ROS   Rhythm:regular Rate:Normal     Neuro/Psych PSYCHIATRIC DISORDERS Anxiety Depression TIA   GI/Hepatic GERD  ,Esophageal stricture.   Endo/Other    Renal/GU      Musculoskeletal   Abdominal   Peds  Hematology   Anesthesia Other Findings   Reproductive/Obstetrics                             Anesthesia Physical Anesthesia Plan  ASA: III  Anesthesia Plan: MAC   Post-op Pain Management:    Induction: Intravenous  Airway Management Planned: Nasal Cannula  Additional Equipment:   Intra-op Plan:   Post-operative Plan:   Informed Consent: I have reviewed the patients History and Physical, chart, labs and discussed the procedure including the risks, benefits and alternatives for the proposed anesthesia with the patient or authorized representative who has indicated his/her understanding and acceptance.     Plan Discussed with: CRNA, Anesthesiologist and Surgeon  Anesthesia Plan Comments:         Anesthesia Quick Evaluation

## 2016-06-22 NOTE — Transfer of Care (Signed)
Immediate Anesthesia Transfer of Care Note  Patient: Dakotah C Omdahl  Procedure(s) Performed: Procedure(s) with comments: ESOPHAGOGASTRODUODENOSCOPY (EGD) WITH PROPOFOL (N/A) - 11:20 ESOPHAGEAL DILATION (N/A)  Patient Location: PACU  Anesthesia Type:MAC  Level of Consciousness: awake, alert  and oriented  Airway & Oxygen Therapy: Patient Spontanous Breathing and Patient connected to nasal cannula oxygen  Post-op Assessment: Report given to RN and Post -op Vital signs reviewed and stable  Post vital signs: Reviewed and stable  Last Vitals:  Vitals:   06/22/16 1205 06/22/16 1210  BP: 128/64 139/64  Pulse:    Resp: (!) 27 (!) 31  Temp:      Last Pain:  Vitals:   06/22/16 1034  TempSrc: Oral         Complications: No apparent anesthesia complications

## 2016-06-25 ENCOUNTER — Encounter (INDEPENDENT_AMBULATORY_CARE_PROVIDER_SITE_OTHER): Payer: Self-pay | Admitting: *Deleted

## 2016-06-25 NOTE — Addendum Note (Signed)
Addendum  created 06/25/16 1539 by Vista Deck, CRNA   Charge Capture section accepted

## 2016-06-27 ENCOUNTER — Encounter (HOSPITAL_COMMUNITY): Payer: Self-pay | Admitting: Internal Medicine

## 2016-09-09 IMAGING — RF DG HIP (WITH PELVIS) OPERATIVE*L*
1 series · 6 of 6 positions shown · non-contrast
Comparison: Radiographs 09/16/2015

FLUOROSCOPY TIME:  1 minutes 43 seconds

Images obtained: 6

CLINICAL DATA: Intertrochanteric fracture LEFT hip

EXAM:
OPERATIVE LEFT HIP (WITH PELVIS IF PERFORMED) 6 VIEWS
TECHNIQUE: Fluoroscopic spot image(s) were submitted for interpretation
post-operatively.

[Series 1: run · 6 of 6 slices shown]
[im 1/6]
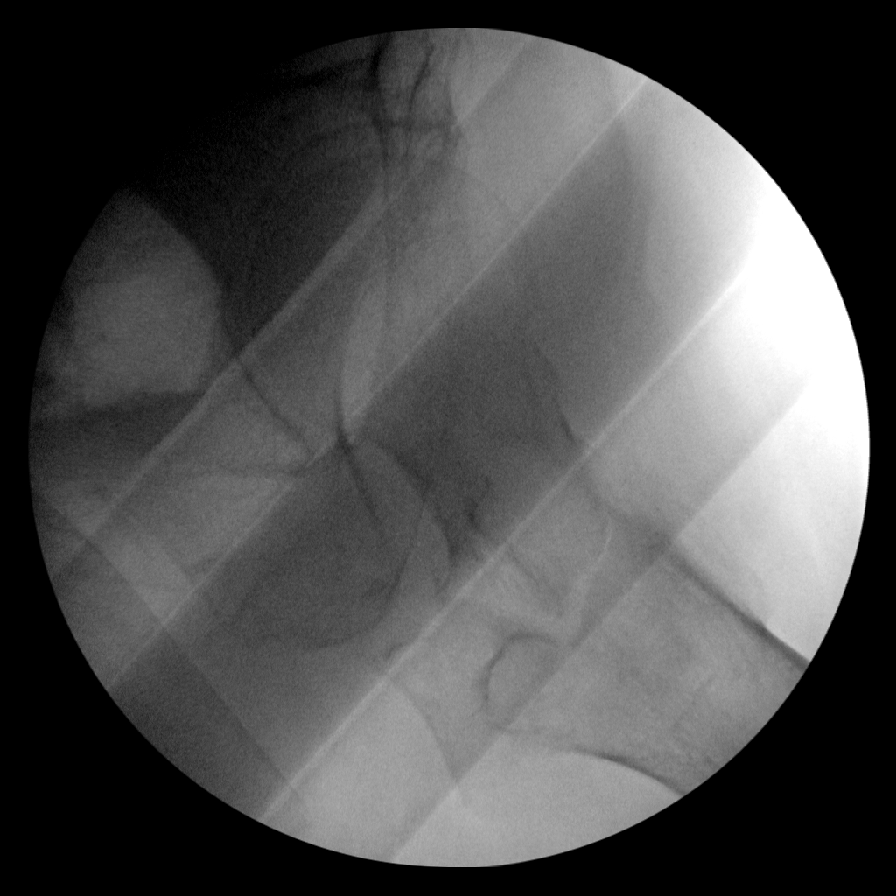
[im 2/6]
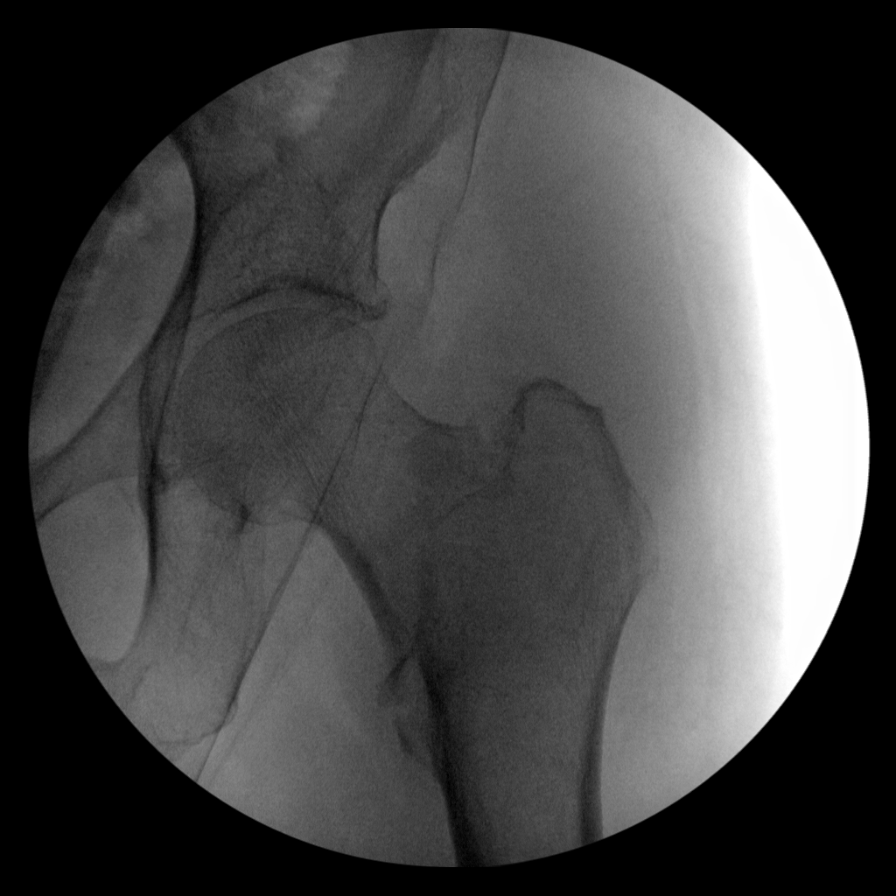
[im 3/6]
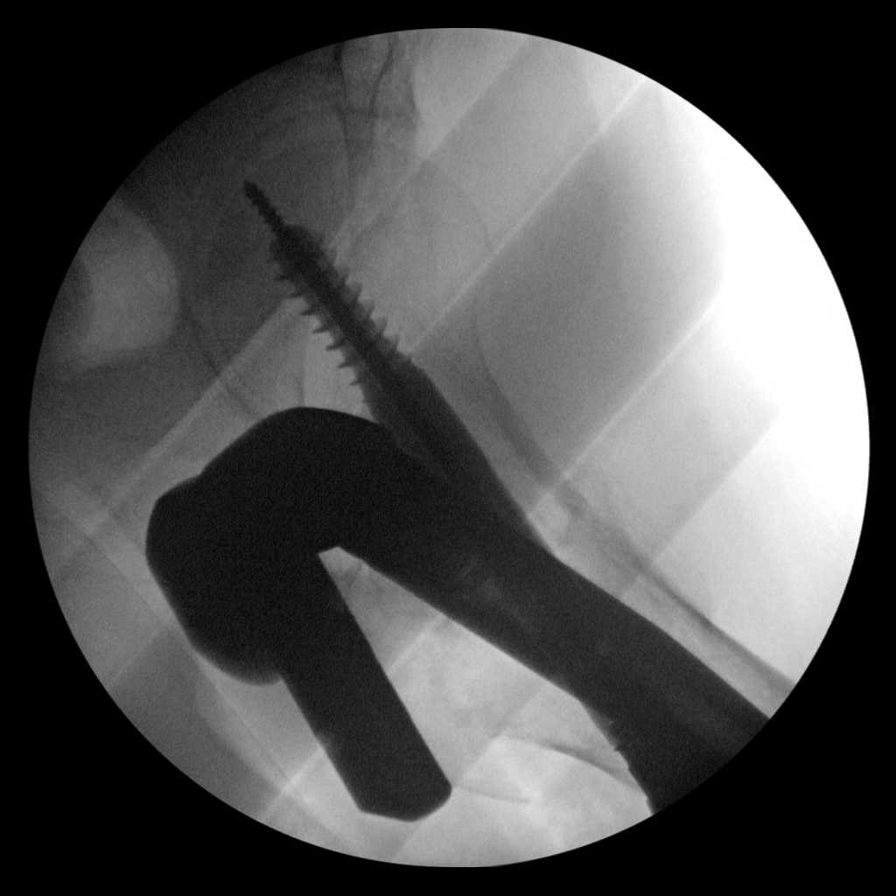
[im 4/6]
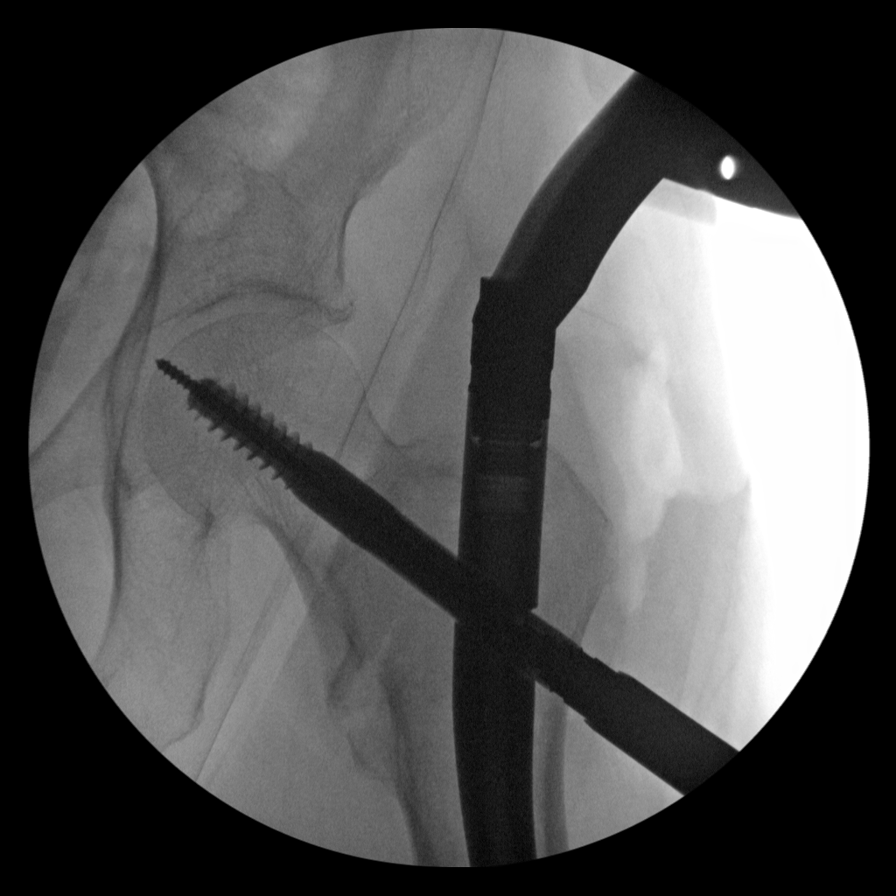
[im 5/6]
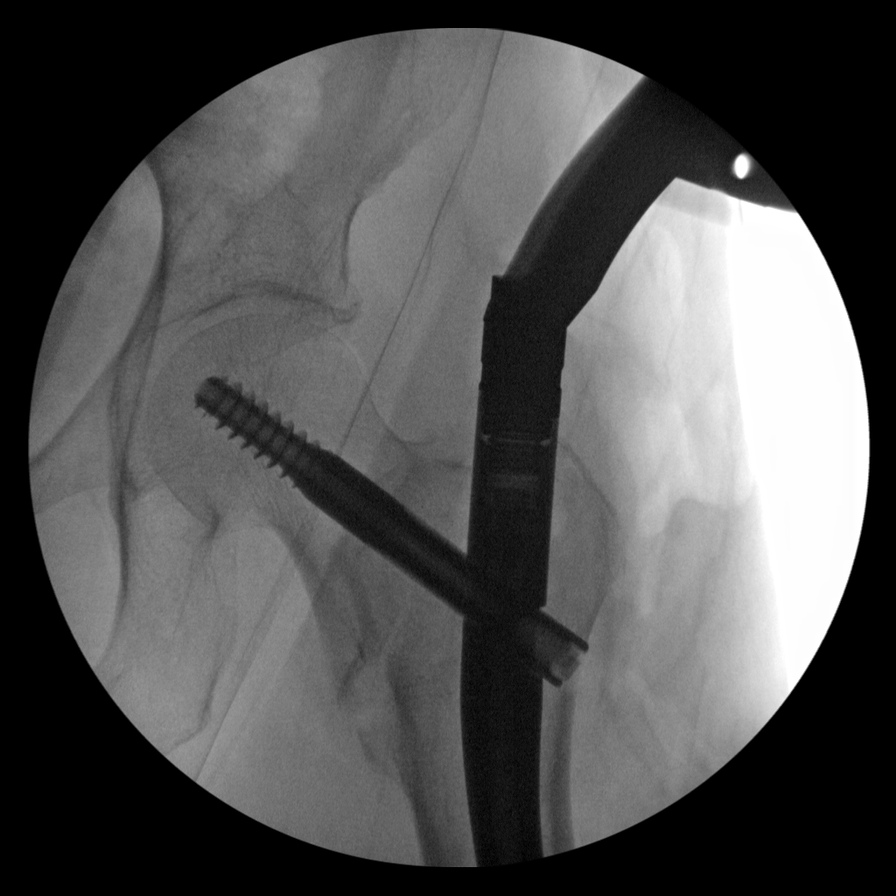
[im 6/6]
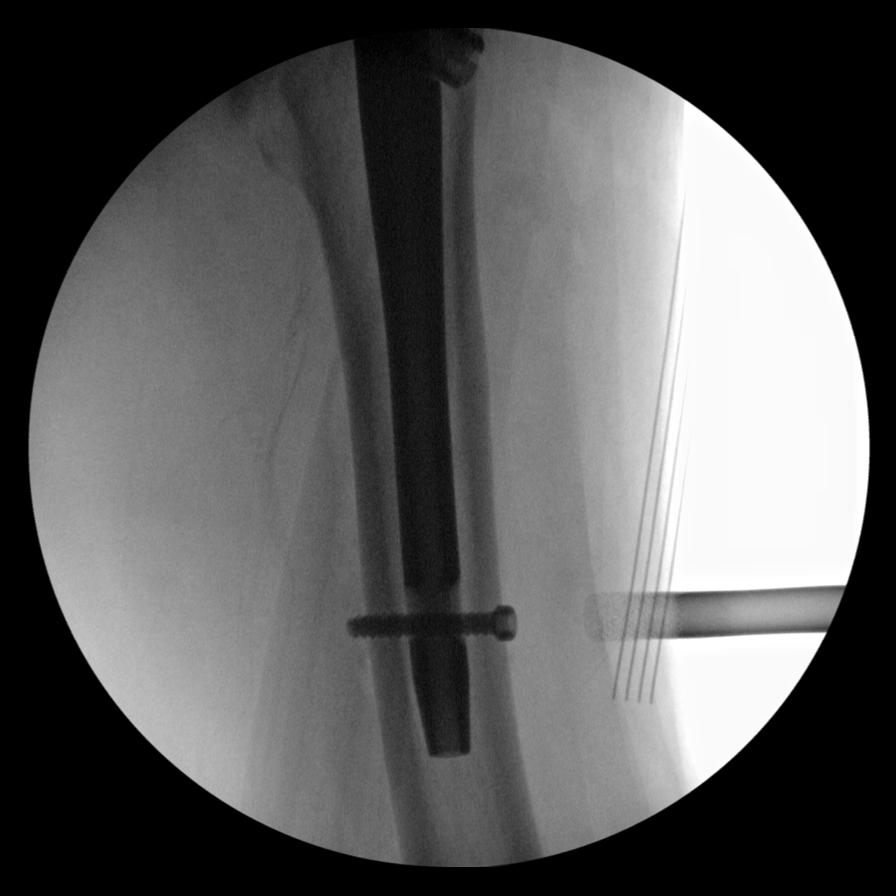

[6 of 6 positions shown; findings below may reference images not displayed]

FINDINGS: Osseous demineralization.

Images demonstrate reduction of previously identified
intertrochanteric fracture LEFT femur.

IM nail with distal locking screw placed.

Proximal compression screw placed into the LEFT femoral head.

No additional fracture or dislocation.
IMPRESSION: Post ORIF of a reduced intertrochanteric fracture LEFT femur.

## 2016-09-25 ENCOUNTER — Encounter (INDEPENDENT_AMBULATORY_CARE_PROVIDER_SITE_OTHER): Payer: Self-pay

## 2016-09-25 ENCOUNTER — Ambulatory Visit (INDEPENDENT_AMBULATORY_CARE_PROVIDER_SITE_OTHER): Payer: Medicare HMO | Admitting: Internal Medicine

## 2016-09-25 ENCOUNTER — Encounter (INDEPENDENT_AMBULATORY_CARE_PROVIDER_SITE_OTHER): Payer: Self-pay | Admitting: Internal Medicine

## 2016-09-25 VITALS — BP 140/82 | HR 72 | Temp 97.5°F | Resp 18 | Ht 68.0 in | Wt 99.5 lb

## 2016-09-25 DIAGNOSIS — K222 Esophageal obstruction: Secondary | ICD-10-CM

## 2016-09-25 DIAGNOSIS — K219 Gastro-esophageal reflux disease without esophagitis: Secondary | ICD-10-CM | POA: Diagnosis not present

## 2016-09-25 MED ORDER — OMEPRAZOLE 20 MG PO CPDR: 20.0000 mg | DELAYED_RELEASE_CAPSULE | Freq: Every day | ORAL | 3 refills | Status: DC

## 2016-09-25 NOTE — Progress Notes (Signed)
Presenting complaint;  Follow-up for GERD and esophageal stricture.  Subjective:  Patient is 83 year old Caucasian female who was chronic GERD complicated by high-grade distal esophageal stricture and known hiatal hernia. She has undergone multiple esophageal dilations in the past. Last dilation was on 06/22/2016 when esophageal stricture was dilated to 18 mm for the first time. She states she has been able to swallow much better since her last dilation. She has had some difficulty when she drinks cold liquids. She has only gained 2 pounds in the last 5 months. She says Megace was discontinued because she started spotting. He continues to smoke cigarettes. She smoking but a pack a day. She also complains of painful feet particularly when she walks on a cold surface. She had noninvasive evaluation and was told she had peripheral vascular disease. She has heartburn and nocturnal regurgitation occasionally.   Current Medications: Outpatient Encounter Prescriptions as of 09/25/2016  Medication Sig  . ALPRAZolam (XANAX) 0.25 MG tablet Take 1 tablet (0.25 mg total) by mouth 2 (two) times daily as needed for anxiety. (Patient taking differently: Take 0.125-0.25 mg by mouth 2 (two) times daily as needed for anxiety or sleep. )  . aspirin EC 81 MG tablet Take 1 tablet (81 mg total) by mouth daily.  . calcium carbonate (OSCAL) 1500 (600 Ca) MG TABS tablet Take 600 mg of elemental calcium by mouth daily.  Marland Kitchen HYDROcodone-acetaminophen (NORCO/VICODIN) 5-325 MG tablet Take 1 tablet by mouth as needed for moderate pain.  Marland Kitchen omeprazole (PRILOSEC) 20 MG capsule Take 20 mg by mouth daily.  . vitamin C (ASCORBIC ACID) 500 MG tablet Take 500 mg by mouth daily.  . Pediatric Multivitamins-Iron (FLINTSTONES PLUS IRON) chewable tablet Chew 1 tablet by mouth 2 (two) times daily. (Patient not taking: Reported on 09/25/2016)  . [DISCONTINUED] Iron-FA-B Cmp-C-Biot-Probiotic (FUSION PLUS) CAPS Take 1 capsule by mouth daily.  .  [DISCONTINUED] megestrol (MEGACE) 40 MG tablet TAKE 1 TABLET (40 MG) BY MOUTH TWICE DAILY TO IMPROVE APPETITE  . [DISCONTINUED] mirtazapine (REMERON) 30 MG tablet Take 30 mg by mouth at bedtime as needed.    No facility-administered encounter medications on file as of 09/25/2016.      Objective: Blood pressure 140/82, pulse 72, temperature 97.5 F (36.4 C), temperature source Oral, resp. rate 18, height 5\' 8"  (1.727 m), weight 99 lb 8 oz (45.1 kg). Patient is alert and in no acute distress. Conjunctiva is pink. Sclera is nonicteric Oropharyngeal mucosa is normal. No neck masses or thyromegaly noted. Cardiac exam with regular rhythm normal S1 and S2. No murmur or gallop noted. Lungs are clear to auscultation. Abdomen is flat soft and nontender. No organomegaly or masses. No LE edema or clubbing noted.    Assessment:  #1.  Benign distal esophageal stricture secondary to chronic GERD last dilated to 18 mm in October 2017. She is still able to swallow much better. #2. Chronic GERD. She has known large hiatal hernia. Symptoms are well controlled with therapy. I'm afraid benefit of using PPI outweighs the risk. #3.  Low BMI. She has not lost any more weight. If she is able to quit cigarette smoking weight loss should reverse.    Plan:  Discontinue by mouth iron. Continue omeprazole 20 mg by mouth every morning. Call for dysphagia. Office visit in one year.

## 2016-09-25 NOTE — Patient Instructions (Addendum)
You must quit cigarette smoking. If you are not able to do it on your own please review options with Dr. Salli Real. Call for swallowing difficulty.

## 2017-02-28 ENCOUNTER — Emergency Department (HOSPITAL_COMMUNITY): Payer: Medicare HMO

## 2017-02-28 ENCOUNTER — Observation Stay (HOSPITAL_COMMUNITY)
Admission: EM | Admit: 2017-02-28 | Discharge: 2017-03-02 | Disposition: A | Discharge status: Home / Self Care | Payer: Medicare HMO | Attending: Internal Medicine | Admitting: Internal Medicine

## 2017-02-28 ENCOUNTER — Encounter (HOSPITAL_COMMUNITY): Payer: Self-pay | Admitting: Cardiology

## 2017-02-28 DIAGNOSIS — R52 Pain, unspecified: Secondary | ICD-10-CM

## 2017-02-28 DIAGNOSIS — W19XXXA Unspecified fall, initial encounter: Secondary | ICD-10-CM

## 2017-02-28 DIAGNOSIS — M25552 Pain in left hip: Secondary | ICD-10-CM | POA: Insufficient documentation

## 2017-02-28 DIAGNOSIS — T148XXA Other injury of unspecified body region, initial encounter: Secondary | ICD-10-CM | POA: Diagnosis not present

## 2017-02-28 DIAGNOSIS — Y999 Unspecified external cause status: Secondary | ICD-10-CM | POA: Diagnosis not present

## 2017-02-28 DIAGNOSIS — F17210 Nicotine dependence, cigarettes, uncomplicated: Secondary | ICD-10-CM | POA: Diagnosis not present

## 2017-02-28 DIAGNOSIS — Y92009 Unspecified place in unspecified non-institutional (private) residence as the place of occurrence of the external cause: Secondary | ICD-10-CM

## 2017-02-28 DIAGNOSIS — Z87891 Personal history of nicotine dependence: Secondary | ICD-10-CM | POA: Diagnosis present

## 2017-02-28 DIAGNOSIS — Z7982 Long term (current) use of aspirin: Secondary | ICD-10-CM | POA: Insufficient documentation

## 2017-02-28 DIAGNOSIS — S79912A Unspecified injury of left hip, initial encounter: Secondary | ICD-10-CM | POA: Diagnosis present

## 2017-02-28 DIAGNOSIS — Y939 Activity, unspecified: Secondary | ICD-10-CM | POA: Diagnosis not present

## 2017-02-28 DIAGNOSIS — Z79899 Other long term (current) drug therapy: Secondary | ICD-10-CM | POA: Insufficient documentation

## 2017-02-28 DIAGNOSIS — Y92000 Kitchen of unspecified non-institutional (private) residence as  the place of occurrence of the external cause: Secondary | ICD-10-CM | POA: Insufficient documentation

## 2017-02-28 DIAGNOSIS — W010XXA Fall on same level from slipping, tripping and stumbling without subsequent striking against object, initial encounter: Secondary | ICD-10-CM | POA: Diagnosis not present

## 2017-02-28 DIAGNOSIS — Z8673 Personal history of transient ischemic attack (TIA), and cerebral infarction without residual deficits: Secondary | ICD-10-CM | POA: Diagnosis not present

## 2017-02-28 DIAGNOSIS — S300XXA Contusion of lower back and pelvis, initial encounter: Principal | ICD-10-CM | POA: Insufficient documentation

## 2017-02-28 DIAGNOSIS — J449 Chronic obstructive pulmonary disease, unspecified: Secondary | ICD-10-CM | POA: Diagnosis not present

## 2017-02-28 DIAGNOSIS — Z720 Tobacco use: Secondary | ICD-10-CM | POA: Diagnosis present

## 2017-02-28 LAB — CBC WITH DIFFERENTIAL/PLATELET
Basophils Absolute: 0 10*3/uL (ref 0.0–0.1)
Basophils Relative: 0 %
Eosinophils Absolute: 0.1 10*3/uL (ref 0.0–0.7)
Eosinophils Relative: 1 %
HCT: 44.8 % (ref 36.0–46.0)
Hemoglobin: 14.8 g/dL (ref 12.0–15.0)
Lymphocytes Relative: 4 %
Lymphs Abs: 0.5 10*3/uL — ABNORMAL LOW (ref 0.7–4.0)
MCH: 26.5 pg (ref 26.0–34.0)
MCHC: 33 g/dL (ref 30.0–36.0)
MCV: 80.1 fL (ref 78.0–100.0)
Monocytes Absolute: 0.9 10*3/uL (ref 0.1–1.0)
Monocytes Relative: 7 %
Neutro Abs: 10.3 10*3/uL — ABNORMAL HIGH (ref 1.7–7.7)
Neutrophils Relative %: 88 %
Platelets: 584 10*3/uL — ABNORMAL HIGH (ref 150–400)
RBC: 5.59 MIL/uL — ABNORMAL HIGH (ref 3.87–5.11)
RDW: 19.6 % — ABNORMAL HIGH (ref 11.5–15.5)
WBC: 11.8 10*3/uL — ABNORMAL HIGH (ref 4.0–10.5)

## 2017-02-28 LAB — COMPREHENSIVE METABOLIC PANEL
ALT: 21 U/L (ref 14–54)
AST: 39 U/L (ref 15–41)
Albumin: 3.8 g/dL (ref 3.5–5.0)
Alkaline Phosphatase: 60 U/L (ref 38–126)
Anion gap: 9 (ref 5–15)
BUN: 9 mg/dL (ref 6–20)
CO2: 26 mmol/L (ref 22–32)
Calcium: 8.9 mg/dL (ref 8.9–10.3)
Chloride: 94 mmol/L — ABNORMAL LOW (ref 101–111)
Creatinine, Ser: 0.71 mg/dL (ref 0.44–1.00)
GFR calc Af Amer: >60 mL/min (ref >60)
GFR calc non Af Amer: >60 mL/min (ref >60)
Glucose, Bld: 159 mg/dL — ABNORMAL HIGH (ref 65–99)
Potassium: 4.3 mmol/L (ref 3.5–5.1)
Sodium: 129 mmol/L — ABNORMAL LOW (ref 135–145)
Total Bilirubin: 0.8 mg/dL (ref 0.3–1.2)
Total Protein: 6.4 g/dL — ABNORMAL LOW (ref 6.5–8.1)

## 2017-02-28 LAB — PROTIME-INR
INR: 1.18
Prothrombin Time: 15 seconds (ref 11.4–15.2)

## 2017-02-28 MED ORDER — HYDROMORPHONE HCL 1 MG/ML IJ SOLN
0.5000 mg | Freq: Once | INTRAMUSCULAR | Status: AC
  Administered 2017-02-28: 0.5 mg via INTRAVENOUS
  Filled 2017-02-28: qty 1

## 2017-02-28 MED ORDER — ASPIRIN EC 81 MG PO TBEC
81.0000 mg | DELAYED_RELEASE_TABLET | Freq: Every day | ORAL | Status: DC
  Administered 2017-03-01: 81 mg via ORAL
  Filled 2017-02-28: qty 1

## 2017-02-28 MED ORDER — PANTOPRAZOLE SODIUM 40 MG PO TBEC
40.0000 mg | DELAYED_RELEASE_TABLET | Freq: Every day | ORAL | Status: DC
  Administered 2017-03-01 – 2017-03-02 (×2): 40 mg via ORAL
  Filled 2017-02-28 (×2): qty 1

## 2017-02-28 MED ORDER — ONDANSETRON HCL 4 MG/2ML IJ SOLN
4.0000 mg | Freq: Four times a day (QID) | INTRAMUSCULAR | Status: DC | PRN
  Administered 2017-02-28 – 2017-03-02 (×4): 4 mg via INTRAVENOUS
  Filled 2017-02-28 (×5): qty 2

## 2017-02-28 MED ORDER — HYDROMORPHONE HCL 1 MG/ML IJ SOLN
0.5000 mg | INTRAMUSCULAR | Status: DC | PRN
  Administered 2017-02-28 – 2017-03-01 (×4): 0.5 mg via INTRAVENOUS
  Filled 2017-02-28 (×4): qty 0.5

## 2017-02-28 MED ORDER — FENTANYL CITRATE (PF) 100 MCG/2ML IJ SOLN
50.0000 ug | Freq: Once | INTRAMUSCULAR | Status: AC
  Administered 2017-02-28: 50 ug via INTRAVENOUS
  Filled 2017-02-28: qty 2

## 2017-02-28 MED ORDER — SODIUM CHLORIDE 0.9% FLUSH
3.0000 mL | Freq: Two times a day (BID) | INTRAVENOUS | Status: DC
  Administered 2017-03-02: 3 mL via INTRAVENOUS

## 2017-02-28 MED ORDER — DEXTROSE-NACL 5-0.9 % IV SOLN
INTRAVENOUS | Status: DC
  Administered 2017-02-28 – 2017-03-01 (×2): via INTRAVENOUS

## 2017-02-28 MED ORDER — ONDANSETRON HCL 4 MG PO TABS: 4.0000 mg | ORAL_TABLET | Freq: Four times a day (QID) | ORAL | Status: DC | PRN

## 2017-02-28 MED ORDER — HYDROCODONE-ACETAMINOPHEN 5-325 MG PO TABS: 0.5000 | ORAL_TABLET | ORAL | Status: DC | PRN

## 2017-02-28 MED ORDER — BISACODYL 5 MG PO TBEC: 5.0000 mg | DELAYED_RELEASE_TABLET | Freq: Every day | ORAL | Status: DC | PRN

## 2017-02-28 NOTE — H&P (Signed)
History and Physical    Taylor Cannon NWG:[id number] DOB: [birth year] DOA: 02/28/2017  PCP: Elyn Aquas  Patient coming from: Home.    Chief Complaint:  [place], can't walk.  HPI: Taylor Cannon is an 83 y.o. female with hx of tobacco abuse, COPD on home oxygen, hx of anxiety, weight loss, s/p ORIF of the left hip, fell (mechanical fall), and hurt her right hip.  CT of the hip showed a large hematoma (17 x 10 cm) involving the gluteus maximus, and smaller one involving the periformis muscle.  There is no evidence of any Fx or dislocation, and prior hardware was seen.  Her Hb is stable at 14.8 g per dL.  She is not hypotensive, and her Cr is normal.  She has a NA of 129.  Unfortunately, her pain is severe, and she cannot ambulate.  She lives alone with family nearby.  Hospitalist was asked to admit her for pain management, and in consideration of MRI imaging to be sure there is stability of her prior ORIF.       ED Course:  See above.  Rewiew of Systems:  Constitutional: Negative for malaise, fever and chills. No significant weight loss or weight gain Eyes: Negative for eye pain, redness and discharge, diplopia, visual changes, or flashes of light. ENMT: Negative for ear pain, hoarseness, nasal congestion, sinus pressure and sore throat. No headaches; tinnitus, drooling, or problem swallowing. Cardiovascular: Negative for chest pain, palpitations, diaphoresis, dyspnea and peripheral edema. ; No orthopnea, PND Respiratory: Negative for cough, hemoptysis, wheezing and stridor. No pleuritic chestpain. Gastrointestinal: Negative for diarrhea, constipation,  melena, blood in stool, hematemesis, jaundice and rectal bleeding.    Genitourinary: Negative for frequency, dysuria, incontinence,flank pain and hematuria; Musculoskeletal: Negative for back pain and neck pain.  Skin: . Negative for pruritus, rash, abrasions, bruising and skin lesion.; ulcerations Neuro: Negative for headache, lightheadedness  and neck stiffness. Negative for weakness, altered level of consciousness , altered mental status, extremity weakness, burning feet, involuntary movement, seizure and syncope.  Psych: negative for anxiety, depression, insomnia, tearfulness, panic attacks, hallucinations, paranoia, suicidal or homicidal ideation    Past Medical History:  Diagnosis Date  . Anxiety   . COPD (chronic obstructive pulmonary disease) ([place])   . Depression   . GERD (gastroesophageal reflux disease)   . On home oxygen therapy    2L via [place] at night  . TIA (transient ischemic attack)     Past Surgical History:  Procedure Laterality Date  . APPENDECTOMY    . BALLOON DILATION N/A 07/24/2013   Procedure: BALLOON DILATION to 13.63mm;  Surgeon: Rogene Houston, MD;  Location: [facility];  Service: Endoscopy;  Laterality: N/A;  . BALLOON DILATION N/A 11/11/2014   Procedure: BALLOON DILATION;  Surgeon: Rogene Houston, MD;  Location: [facility];  Service: Endoscopy;  Laterality: N/A;  . CARDIAC ELECTROPHYSIOLOGY STUDY AND ABLATION    . CATARACT EXTRACTION Bilateral   . CERVICAL CONE BIOPSY    . ESOPHAGEAL DILATION N/A 11/30/2014   Procedure: ESOPHAGEAL DILATION;  Surgeon: Rogene Houston, MD;  Location: [facility];  Service: Endoscopy;  Laterality: N/A;  Balloon, 15, 16.5  . ESOPHAGEAL DILATION N/A 06/03/2015   Procedure: ESOPHAGEAL DILATION WITH 16.5 MM BALLOON ;  Surgeon: Rogene Houston, MD;  Location: [facility];  Service: Endoscopy;  Laterality: N/A;  . ESOPHAGEAL DILATION N/A 09/09/2015   Procedure: ESOPHAGEAL DILATION;  Surgeon: Rogene Houston, MD;  [facility];  Service: Endoscopy;  Laterality: N/A;  . ESOPHAGEAL DILATION N/A 05/04/2016   Procedure: ESOPHAGEAL DILATION;  Surgeon: Rogene Houston, MD;  [facility];  Service: Endoscopy;  Laterality: N/A;  . ESOPHAGEAL DILATION N/A 06/22/2016   Procedure: ESOPHAGEAL DILATION;  Surgeon: Rogene Houston, MD;  [facility];  Service: Endoscopy;   Laterality: N/A;  . ESOPHAGOGASTRODUODENOSCOPY N/A 11/11/2014   Procedure: ESOPHAGOGASTRODUODENOSCOPY (EGD);  Surgeon: Rogene Houston, MD;  [facility];  Service: Endoscopy;  Laterality: N/A;  125-rescheduled 11/11/14 @ 10:30am Ann notified pt  . ESOPHAGOGASTRODUODENOSCOPY (EGD) WITH ESOPHAGEAL DILATION N/A 08/20/2013   Procedure: ESOPHAGOGASTRODUODENOSCOPY (EGD) WITH ESOPHAGEAL DILATION;  Surgeon: Rogene Houston, MD;  [facility];  Service: Endoscopy;  Laterality: N/A;  345-moved to 730 Ann notified pt  . ESOPHAGOGASTRODUODENOSCOPY (EGD) WITH PROPOFOL N/A 07/24/2013   Procedure: ESOPHAGOGASTRODUODENOSCOPY (EGD) WITH PROPOFOL UNDER FLUOROSCOPY;  Surgeon: Rogene Houston, MD;  Location: [facility];  Service: Endoscopy;  Laterality: N/A;  . ESOPHAGOGASTRODUODENOSCOPY (EGD) WITH PROPOFOL N/A 11/30/2014   Procedure: ESOPHAGOGASTRODUODENOSCOPY (EGD) WITH PROPOFOL;  Surgeon: Rogene Houston, MD;  Location: [facility];  Service: Endoscopy;  Laterality: N/A;  fluoro not needed  . ESOPHAGOGASTRODUODENOSCOPY (EGD) WITH PROPOFOL N/A 06/03/2015   Procedure: ESOPHAGOGASTRODUODENOSCOPY (EGD) WITH PROPOFOL; HIATUS AT 35 CM AND GE JUNCTION 40 CM;  Surgeon: Rogene Houston, MD;  Location: [facility];  Service: Endoscopy;  Laterality: N/A;  . ESOPHAGOGASTRODUODENOSCOPY (EGD) WITH PROPOFOL N/A 09/09/2015   Procedure: ESOPHAGOGASTRODUODENOSCOPY (EGD) WITH PROPOFOL;  Surgeon: Rogene Houston, MD;  [facility];  Service: Endoscopy;  Laterality: N/A;  8:15  . ESOPHAGOGASTRODUODENOSCOPY (EGD) WITH PROPOFOL N/A 05/04/2016   Procedure: ESOPHAGOGASTRODUODENOSCOPY (EGD) WITH PROPOFOL;  Surgeon: Rogene Houston, MD;  [facility];  Service: Endoscopy;  Laterality: N/A;  9:30  . ESOPHAGOGASTRODUODENOSCOPY (EGD) WITH PROPOFOL N/A 06/22/2016   Procedure: ESOPHAGOGASTRODUODENOSCOPY (EGD) WITH PROPOFOL;  Surgeon: Rogene Houston, MD;  [facility];  Service: Endoscopy;  Laterality: N/A;  11:20    . INTRAMEDULLARY (IM) NAIL INTERTROCHANTERIC Left 09/18/2015   Procedure: INTRAMEDULLARY (IM) NAIL INTERTROCHANTRIC;  Surgeon: Carole Civil, MD;  Location: [facility];  Service: Orthopedics;  Laterality: Left;  . TUBAL LIGATION       reports that she has been smoking Cigarettes.  She has a 55.00 pack-year smoking history. She has never used smokeless tobacco. She reports that she drinks about 8.4 oz of alcohol per week . She reports that she does not use drugs.  Allergies  Allergen Reactions  . Other Nausea Only    Steroids  . Oxycodone Nausea And Vomiting    History reviewed. No pertinent family history.   Prior to Admission medications   Medication Sig Start Date End Date Taking? Authorizing Provider  ALPRAZolam (XANAX) 0.25 MG tablet Take 1 tablet (0.25 mg total) by mouth 2 (two) times daily as needed for anxiety. Patient taking differently: Take 0.125-0.25 mg by mouth 2 (two) times daily as needed for anxiety or sleep.  09/20/15  Yes Kathie Dike, MD  aspirin EC 81 MG tablet Take 1 tablet (81 mg total) by mouth daily. 06/26/16  Yes Rehman, Mechele Dawley, MD  calcium carbonate (OSCAL) 1500 (600 Ca) MG TABS tablet Take 600 mg of elemental calcium by mouth daily.   Yes [provider]  HYDROcodone-acetaminophen (NORCO/VICODIN) 5-325 MG tablet Take 0.5-1 tablets by mouth as needed for moderate pain.    Yes [provider]  omeprazole (PRILOSEC) 20 MG capsule Take  1 capsule (20 mg total) by mouth daily. 09/25/16  Yes Rehman, Mechele Dawley, MD  OXYGEN Inhale 2 L into the lungs at bedtime.   Yes [provider]  vitamin C (ASCORBIC ACID) 500 MG tablet Take 500 mg by mouth daily.   Yes [provider]    Physical Exam: Vitals:   02/28/17 1619 02/28/17 1622 02/28/17 1800 02/28/17 2037  BP:  121/73 134/72 126/78  Pulse:  92 99 (!) 105  Resp:  16 16 (!) 21  Temp:  98 F (36.7 C)    TempSrc:  Oral    SpO2:  92% 93% 95%  Weight: 46.7 kg (103 lb)      Height: 5\' 8"  (1.727 m)         Constitutional: NAD, calm, comfortable Vitals:   02/28/17 1619 02/28/17 1622 02/28/17 1800 02/28/17 2037  BP:  121/73 134/72 126/78  Pulse:  92 99 (!) 105  Resp:  16 16 (!) 21  Temp:  98 F (36.7 C)    TempSrc:  Oral    SpO2:  92% 93% 95%  Weight: 46.7 kg (103 lb)     Height: 5\' 8"  (1.727 m)      Eyes: PERRL, lids and conjunctivae normal ENMT: Mucous membranes are moist. Posterior pharynx clear of any exudate or lesions.Normal dentition.  Neck: normal, supple, no masses, no thyromegaly Respiratory: clear to auscultation bilaterally, no wheezing, no crackles. Normal respiratory effort. No accessory muscle use.  Cardiovascular: Regular rate and rhythm, no murmurs / rubs / gallops. No extremity edema. 2+ pedal pulses. No carotid bruits.  Abdomen: no tenderness, no masses palpated. No hepatosplenomegaly. Bowel sounds positive.  Musculoskeletal: no clubbing / cyanosis. No joint deformity upper and lower extremities. Good ROM, no contractures. Normal muscle tone.  Skin: no rashes, lesions, ulcers. No induration Neurologic: CN 2-12 grossly intact. Sensation intact, DTR normal. Strength 5/5 in all 4.  Psychiatric: Normal judgment and insight. Alert and oriented x 3. Normal mood.   Labs on Admission: I have personally reviewed following labs and imaging studies  CBC:  Recent Labs Lab 02/28/17 1709  WBC 11.8*  NEUTROABS 10.3*  HGB 14.8  HCT 44.8  MCV 80.1  PLT 998*   Basic Metabolic Panel:  Recent Labs Lab 02/28/17 1709  NA 129*  K 4.3  CL 94*  CO2 26  GLUCOSE 159*  BUN 9  CREATININE 0.71  CALCIUM 8.9   GFR: Estimated Creatinine Clearance: 41.3 mL/min (by C-G formula based on SCr of 0.71 mg/dL). Liver Function Tests:  Recent Labs Lab 02/28/17 1709  AST 39  ALT 21  ALKPHOS 60  BILITOT 0.8  PROT 6.4*  ALBUMIN 3.8   Coagulation Profile:  Recent Labs Lab 02/28/17 1903  INR 1.18   Urine analysis:    Component Value  Date/Time   COLORURINE YELLOW 09/16/2015 0930   APPEARANCEUR CLEAR 09/16/2015 0930   LABSPEC <1.005 (L) 09/16/2015 0930   PHURINE 5.5 09/16/2015 0930   GLUCOSEU NEGATIVE 09/16/2015 0930   HGBUR NEGATIVE 09/16/2015 0930   BILIRUBINUR NEGATIVE 09/16/2015 0930   KETONESUR NEGATIVE 09/16/2015 0930   PROTEINUR NEGATIVE 09/16/2015 0930   NITRITE NEGATIVE 09/16/2015 0930   LEUKOCYTESUR NEGATIVE 09/16/2015 0930    Radiological Exams on Admission: Ct Head Wo Contrast  Result Date: 02/28/2017 CLINICAL DATA:  Fall yesterday.  Left hip pain. EXAM: CT HEAD WITHOUT CONTRAST TECHNIQUE: Contiguous axial images were obtained from the base of the skull through the vertex without intravenous contrast. COMPARISON:  September 16, 2015 FINDINGS: Brain: No evidence of acute infarction, hemorrhage, hydrocephalus, extra-axial collection or mass lesion/mass effect. Vascular: No hyperdense vessel or unexpected calcification. Skull: Normal. Negative for fracture or focal lesion. Sinuses/Orbits: Debris is seen in the posterior right ethmoid air cell. Mucous retention cysts are seen in the maxillary sinuses. No acute paranasal sinus abnormalities. Mastoid air cells and middle ears are well aerated. Other: None. IMPRESSION: No acute abnormalities are identified. Electronically Signed   By: Dorise Bullion III M.D   On: 02/28/2017 18:08   Ct Pelvis Wo Contrast  Result Date: 02/28/2017 CLINICAL DATA:  Left hip pain following a fall today. EXAM: CT PELVIS WITHOUT CONTRAST TECHNIQUE: Multidetector CT imaging of the pelvis was performed following the standard protocol without intravenous contrast. COMPARISON:  Left hip radiographs obtained earlier today. FINDINGS: Urinary Tract:  No abnormality visualized. Bowel:  Multiple colonic diverticula. Vascular/Lymphatic: Extensive atheromatous arterial calcifications. Reproductive:  Unremarkable uterus and ovaries. Other: Very large left gluteus maximus muscle hematoma. This measures 17.2 x  10.0 cm in maximum dimensions on coronal images 66 and 65 respectively. This measures 10.1 cm in AP dimension on axial image number 83 of series 4. There is a significantly smaller hematoma in the adjacent piriformis muscle on the left. Calcified granuloma in the liver. Musculoskeletal: Previously noted left hip fixation hardware. No fracture or dislocation seen. Lower lumbar spine degenerative changes. IMPRESSION: 1. No fracture or dislocation. 2. Very large left gluteus maximus muscle hematoma and smaller left piriformis muscle hematoma. 3. Extensive colonic diverticulosis. Electronically Signed   By: Claudie Revering M.D.   On: 02/28/2017 18:27   Dg Hip Unilat With Pelvis 2-3 Views Left  Result Date: 02/28/2017 CLINICAL DATA:  Left hip pain after falling EXAM: DG HIP (WITH OR WITHOUT PELVIS) 2-3V LEFT COMPARISON:  Left hip radiograph 12/20/2015 FINDINGS: There are is an antegrade left femoral intramedullary nail with interlocking component in the femoral neck. No periprosthetic fracture or abnormal lucency. The left hip remains approximated. Right hip is unremarkable. No pelvic diastasis. IMPRESSION: No acute fracture or dislocation of the left hip. Electronically Signed   By: Ulyses Jarred M.D.   On: 02/28/2017 17:01    EKG: Independently reviewed.  Assessment/Plan Principal Problem:   Hematoma Active Problems:   COPD (chronic obstructive pulmonary disease) ([place])   Tobacco abuse   Fall at home, initial encounter    PLAN:   Fall: Mechanical fall.    Large hematoma:  Will provide IV pain meds.  Obtain PT for evaluation.  Will obtain MRI of the left hip to check for stability of her hardware with prior ORIF.  I my opinion, she likely will need STR, as she will unlikely to be able to manage quickly.    COPD:  Will continue with nebs as needed.  Continue with oxygen.  Advised stop smoking.  DVT prophylaxis: SCD. Code Status: FULL CODE.  Family Communication: daugher at bedside.  Disposition  Plan: STR likely.  Consults called: None.  Admission status: inpatient.    Geral Tuch MD FACP. [facility]  If 7PM-7AM, please contact night-coverage [email or web address] Password [email or web address]  02/28/2017, 9:30 PM

## 2017-02-28 NOTE — ED Notes (Signed)
Pt to CT. Will get EKG upon return from CT

## 2017-02-28 NOTE — ED Triage Notes (Signed)
Fall around 330 today.  States she tripped in the kitchen.  C/o pain to left hip.  Also states she hit her head.

## 2017-02-28 NOTE — ED Provider Notes (Signed)
[facility] DEPT Provider Note   CSN: [id number] Arrival date & time: 02/28/17  1617     History   Chief Complaint Chief Complaint  Patient presents with  . Fall    HPI Taylor Cannon is a 83 y.o. female.   Fall  This is a new problem. The current episode started less than 1 hour ago. The problem has not changed since onset.Pertinent negatives include no chest pain, no abdominal pain and no shortness of breath. Exacerbated by: movement. Nothing relieves the symptoms. She has tried nothing for the symptoms.    Past Medical History:  Diagnosis Date  . Anxiety   . COPD (chronic obstructive pulmonary disease) ([place])   . Depression   . GERD (gastroesophageal reflux disease)   . On home oxygen therapy    2L via [place] at night  . TIA (transient ischemic attack)     Patient Active Problem List   Diagnosis Date Noted  . Fall at home, initial encounter 02/28/2017  . Hematoma 02/28/2017  . Esophageal stricture 04/11/2016  . IDA (iron deficiency anemia) 04/11/2016  . Intertrochanteric fracture of left femur ([place]) 09/18/2015  . Closed fracture of intertrochanteric section of femur ([place])   . Protein-calorie malnutrition, severe 09/17/2015  . Closed left hip fracture ([place]) 09/16/2015  . COPD (chronic obstructive pulmonary disease) ([place]) 09/16/2015  . Tobacco abuse 09/16/2015  . GERD (gastroesophageal reflux disease) 09/16/2015  . Fall 09/16/2015  . Dysphagia, pharyngoesophageal phase 10/05/2014  . Benign paroxysmal positional vertigo 10/05/2014    Past Surgical History:  Procedure Laterality Date  . APPENDECTOMY    . BALLOON DILATION N/A 07/24/2013   Procedure: BALLOON DILATION to 13.110mm;  Surgeon: Rogene Houston, MD;  Location: [facility];  Service: Endoscopy;  Laterality: N/A;  . BALLOON DILATION N/A 11/11/2014   Procedure: BALLOON DILATION;  Surgeon: Rogene Houston, MD;  Location: [facility];  Service: Endoscopy;  Laterality: N/A;  . CARDIAC ELECTROPHYSIOLOGY STUDY AND  ABLATION    . CATARACT EXTRACTION Bilateral   . CERVICAL CONE BIOPSY    . ESOPHAGEAL DILATION N/A 11/30/2014   Procedure: ESOPHAGEAL DILATION;  Surgeon: Rogene Houston, MD;  Location: [facility];  Service: Endoscopy;  Laterality: N/A;  Balloon, 15, 16.5  . ESOPHAGEAL DILATION N/A 06/03/2015   Procedure: ESOPHAGEAL DILATION WITH 16.5 MM BALLOON ;  Surgeon: Rogene Houston, MD;  Location: [facility];  Service: Endoscopy;  Laterality: N/A;  . ESOPHAGEAL DILATION N/A 09/09/2015   Procedure: ESOPHAGEAL DILATION;  Surgeon: Rogene Houston, MD;  Location: [facility];  Service: Endoscopy;  Laterality: N/A;  . ESOPHAGEAL DILATION N/A 05/04/2016   Procedure: ESOPHAGEAL DILATION;  Surgeon: Rogene Houston, MD;  [facility]: [facility];  Service: Endoscopy;  Laterality: N/A;  . ESOPHAGEAL DILATION N/A 06/22/2016   Procedure: ESOPHAGEAL DILATION;  Surgeon: Rogene Houston, MD;  [facility];  Service: Endoscopy;  Laterality: N/A;  . ESOPHAGOGASTRODUODENOSCOPY N/A 11/11/2014   Procedure: ESOPHAGOGASTRODUODENOSCOPY (EGD);  Surgeon: Rogene Houston, MD;  [facility];  Service: Endoscopy;  Laterality: N/A;  125-rescheduled 11/11/14 @ 10:30am Ann notified pt  . ESOPHAGOGASTRODUODENOSCOPY (EGD) WITH ESOPHAGEAL DILATION N/A 08/20/2013   Procedure: ESOPHAGOGASTRODUODENOSCOPY (EGD) WITH ESOPHAGEAL DILATION;  Surgeon: Rogene Houston, MD;  [facility];  Service: Endoscopy;  Laterality: N/A;  345-moved to 730 Ann notified pt  . ESOPHAGOGASTRODUODENOSCOPY (EGD) WITH PROPOFOL N/A 07/24/2013   Procedure: ESOPHAGOGASTRODUODENOSCOPY (EGD) WITH PROPOFOL UNDER FLUOROSCOPY;  Surgeon: Rogene Houston, MD;  Location: [facility];  Service: Endoscopy;  Laterality: N/A;  . ESOPHAGOGASTRODUODENOSCOPY (EGD) WITH PROPOFOL N/A 11/30/2014   Procedure: ESOPHAGOGASTRODUODENOSCOPY (EGD) WITH PROPOFOL;  Surgeon: Rogene Houston, MD;  Location: [facility];  Service: Endoscopy;  Laterality: N/A;  fluoro not needed  .  ESOPHAGOGASTRODUODENOSCOPY (EGD) WITH PROPOFOL N/A 06/03/2015   Procedure: ESOPHAGOGASTRODUODENOSCOPY (EGD) WITH PROPOFOL; HIATUS AT 35 CM AND GE JUNCTION 40 CM;  Surgeon: Rogene Houston, MD;  Location: [facility];  Service: Endoscopy;  Laterality: N/A;  . ESOPHAGOGASTRODUODENOSCOPY (EGD) WITH PROPOFOL N/A 09/09/2015   Procedure: ESOPHAGOGASTRODUODENOSCOPY (EGD) WITH PROPOFOL;  Surgeon: Rogene Houston, MD;  [facility];  Service: Endoscopy;  Laterality: N/A;  8:15  . ESOPHAGOGASTRODUODENOSCOPY (EGD) WITH PROPOFOL N/A 05/04/2016   Procedure: ESOPHAGOGASTRODUODENOSCOPY (EGD) WITH PROPOFOL;  Surgeon: Rogene Houston, MD;  [facility];  Service: Endoscopy;  Laterality: N/A;  9:30  . ESOPHAGOGASTRODUODENOSCOPY (EGD) WITH PROPOFOL N/A 06/22/2016   Procedure: ESOPHAGOGASTRODUODENOSCOPY (EGD) WITH PROPOFOL;  Surgeon: Rogene Houston, MD;  [facility];  Service: Endoscopy;  Laterality: N/A;  11:20  . INTRAMEDULLARY (IM) NAIL INTERTROCHANTERIC Left 09/18/2015   Procedure: INTRAMEDULLARY (IM) NAIL INTERTROCHANTRIC;  Surgeon: Carole Civil, MD;  Location: [facility];  Service: Orthopedics;  Laterality: Left;  . TUBAL LIGATION      OB History    No data available       Home Medications    Prior to Admission medications   Medication Sig Start Date End Date Taking? Authorizing Provider  ALPRAZolam (XANAX) 0.25 MG tablet Take 1 tablet (0.25 mg total) by mouth 2 (two) times daily as needed for anxiety. Patient taking differently: Take 0.125-0.25 mg by mouth 2 (two) times daily as needed for anxiety or sleep.  09/20/15  Yes Kathie Dike, MD  aspirin EC 81 MG tablet Take 1 tablet (81 mg total) by mouth daily. 06/26/16  Yes Rehman, Mechele Dawley, MD  calcium carbonate (OSCAL) 1500 (600 Ca) MG TABS tablet Take 600 mg of elemental calcium by mouth daily.   Yes [provider]  HYDROcodone-acetaminophen (NORCO/VICODIN) 5-325 MG tablet Take 0.5-1 tablets by mouth as needed for  moderate pain.    Yes [provider]  omeprazole (PRILOSEC) 20 MG capsule Take 1 capsule (20 mg total) by mouth daily. 09/25/16  Yes Rehman, Mechele Dawley, MD  OXYGEN Inhale 2 L into the lungs at bedtime.   Yes [provider]  vitamin C (ASCORBIC ACID) 500 MG tablet Take 500 mg by mouth daily.   Yes [provider]    Family History History reviewed. No pertinent family history.  Social History Social History  Substance Use Topics  . Smoking status: Current Every Day Smoker    Packs/day: 1.00    Years: 55.00    Types: Cigarettes  . Smokeless tobacco: Never Used     Comment: 1 pack a day x since early twenties.  . Alcohol use 8.4 oz/week    14 Shots of liquor per week     Comment: 2 drinks nightly      Allergies   Other and Oxycodone   Review of Systems Review of Systems  Respiratory: Negative for shortness of breath.   Cardiovascular: Negative for chest pain.  Gastrointestinal: Negative for abdominal pain.  All other systems reviewed and are negative.    Physical Exam Updated Vital Signs BP 126/78 (BP Location: Right Arm)   Pulse (!) 105   Temp 98 F (36.7 C) (Oral)   Resp Marland Kitchen)  21   Ht 5\' 8"  (1.727 m)   Wt 46.7 kg (103 lb)   SpO2 95%   BMI 15.66 kg/m   Physical Exam  Constitutional: She is oriented to person, place, and time. She appears well-developed and well-nourished.  HENT:  Head: Normocephalic and atraumatic.  Eyes: Conjunctivae and EOM are normal.  Neck: Normal range of motion.  Cardiovascular: Normal rate and regular rhythm.   Pulmonary/Chest: No stridor. No respiratory distress.  Abdominal: Soft. She exhibits no distension.  Musculoskeletal: She exhibits tenderness and deformity (left lateral hip).  Neurological: She is alert and oriented to person, place, and time. No cranial nerve deficit. Coordination normal.  Skin: Skin is warm and dry.  Nursing note and vitals reviewed.    ED Treatments / Results  Labs (all labs  ordered are listed, but only abnormal results are displayed) Labs Reviewed  CBC WITH DIFFERENTIAL/PLATELET - Abnormal; Notable for the following:       Result Value   WBC 11.8 (*)    RBC 5.59 (*)    RDW 19.6 (*)    Platelets 584 (*)    Neutro Abs 10.3 (*)    Lymphs Abs 0.5 (*)    All other components within normal limits  COMPREHENSIVE METABOLIC PANEL - Abnormal; Notable for the following:    Sodium 129 (*)    Chloride 94 (*)    Glucose, Bld 159 (*)    Total Protein 6.4 (*)    All other components within normal limits  PROTIME-INR    EKG  EKG Interpretation None       Radiology Ct Head Wo Contrast  Result Date: 02/28/2017 CLINICAL DATA:  Fall yesterday.  Left hip pain. EXAM: CT HEAD WITHOUT CONTRAST TECHNIQUE: Contiguous axial images were obtained from the base of the skull through the vertex without intravenous contrast. COMPARISON:  September 16, 2015 FINDINGS: Brain: No evidence of acute infarction, hemorrhage, hydrocephalus, extra-axial collection or mass lesion/mass effect. Vascular: No hyperdense vessel or unexpected calcification. Skull: Normal. Negative for fracture or focal lesion. Sinuses/Orbits: Debris is seen in the posterior right ethmoid air cell. Mucous retention cysts are seen in the maxillary sinuses. No acute paranasal sinus abnormalities. Mastoid air cells and middle ears are well aerated. Other: None. IMPRESSION: No acute abnormalities are identified. Electronically Signed   By: Dorise Bullion III M.D   On: 02/28/2017 18:08   Ct Pelvis Wo Contrast  Result Date: 02/28/2017 CLINICAL DATA:  Left hip pain following a fall today. EXAM: CT PELVIS WITHOUT CONTRAST TECHNIQUE: Multidetector CT imaging of the pelvis was performed following the standard protocol without intravenous contrast. COMPARISON:  Left hip radiographs obtained earlier today. FINDINGS: Urinary Tract:  No abnormality visualized. Bowel:  Multiple colonic diverticula. Vascular/Lymphatic: Extensive  atheromatous arterial calcifications. Reproductive:  Unremarkable uterus and ovaries. Other: Very large left gluteus maximus muscle hematoma. This measures 17.2 x 10.0 cm in maximum dimensions on coronal images 66 and 65 respectively. This measures 10.1 cm in AP dimension on axial image number 83 of series 4. There is a significantly smaller hematoma in the adjacent piriformis muscle on the left. Calcified granuloma in the liver. Musculoskeletal: Previously noted left hip fixation hardware. No fracture or dislocation seen. Lower lumbar spine degenerative changes. IMPRESSION: 1. No fracture or dislocation. 2. Very large left gluteus maximus muscle hematoma and smaller left piriformis muscle hematoma. 3. Extensive colonic diverticulosis. Electronically Signed   By: Claudie Revering M.D.   On: 02/28/2017 18:27   Dg Hip Unilat With Pelvis  2-3 Views Left  Result Date: 02/28/2017 CLINICAL DATA:  Left hip pain after falling EXAM: DG HIP (WITH OR WITHOUT PELVIS) 2-3V LEFT COMPARISON:  Left hip radiograph 12/20/2015 FINDINGS: There are is an antegrade left femoral intramedullary nail with interlocking component in the femoral neck. No periprosthetic fracture or abnormal lucency. The left hip remains approximated. Right hip is unremarkable. No pelvic diastasis. IMPRESSION: No acute fracture or dislocation of the left hip. Electronically Signed   By: Ulyses Jarred M.D.   On: 02/28/2017 17:01    Procedures Procedures (including critical care time)  Medications Ordered in ED Medications  fentaNYL (SUBLIMAZE) injection 50 mcg (50 mcg Intravenous Given 02/28/17 1707)  fentaNYL (SUBLIMAZE) injection 50 mcg (50 mcg Intravenous Given 02/28/17 1916)  HYDROmorphone (DILAUDID) injection 0.5 mg (0.5 mg Intravenous Given 02/28/17 2059)     Initial Impression / Assessment and Plan / ED Course  I have reviewed the triage vital signs and the nursing notes.  Pertinent labs & imaging results that were available during my care of  the patient were reviewed by me and considered in my medical decision making (see chart for details).     Patient with a mechanical fall and significant left hip pain. X-rays were negative so I discussed with the radiologist next best imaging study he suggested CT scan to eval wait for periprosthetic fracture. This was negative for any new fractures but did show a large 17 x 10 x 10 hematoma which in rest dress affect is likely the cause of her tenderness in her lateral hip and the obvious deformity. Patient given 3 doses of IV pain medication still not able to move her leg secondary to pain and definitely unable to sit up or ambulate. I discussed case with the hospitalist who will admit for further pain control and likely physical therapy and possible rehabilitation. We'll also consider getting an MRI to evaluate for the cause of the large hematoma as she has a normal INR and no h/o anticoagulants.  Final Clinical Impressions(s) / ED Diagnoses   Final diagnoses:  Hematoma  Fall, initial encounter    New Prescriptions New Prescriptions   No medications on file     Quinnlyn Hearns, Corene Cornea, MD 02/28/17 2142

## 2017-02-28 NOTE — ED Notes (Signed)
Pt not able to ambulate at all. Tried to sit pt up in bed and she was not able to due to extreme pain.

## 2017-03-01 ENCOUNTER — Encounter (HOSPITAL_COMMUNITY): Payer: Self-pay | Admitting: *Deleted

## 2017-03-01 ENCOUNTER — Observation Stay (HOSPITAL_COMMUNITY): Payer: Medicare HMO

## 2017-03-01 DIAGNOSIS — T148XXA Other injury of unspecified body region, initial encounter: Secondary | ICD-10-CM | POA: Diagnosis not present

## 2017-03-01 DIAGNOSIS — Y92009 Unspecified place in unspecified non-institutional (private) residence as the place of occurrence of the external cause: Secondary | ICD-10-CM

## 2017-03-01 DIAGNOSIS — W19XXXA Unspecified fall, initial encounter: Secondary | ICD-10-CM

## 2017-03-01 LAB — CBC
HCT: 39.6 % (ref 36.0–46.0)
HCT: 37.6 % (ref 36.0–46.0)
Hemoglobin: 12.9 g/dL (ref 12.0–15.0)
Hemoglobin: 12.2 g/dL (ref 12.0–15.0)
MCH: 26.4 pg (ref 26.0–34.0)
MCH: 26.5 pg (ref 26.0–34.0)
MCHC: 32.6 g/dL (ref 30.0–36.0)
MCHC: 32.4 g/dL (ref 30.0–36.0)
MCV: 81.1 fL (ref 78.0–100.0)
MCV: 81.6 fL (ref 78.0–100.0)
Platelets: 1175 10*3/uL (ref 150–400)
Platelets: 1090 10*3/uL (ref 150–400)
RBC: 4.88 MIL/uL (ref 3.87–5.11)
RBC: 4.61 MIL/uL (ref 3.87–5.11)
RDW: 19.8 % — ABNORMAL HIGH (ref 11.5–15.5)
RDW: 20 % — ABNORMAL HIGH (ref 11.5–15.5)
WBC: 25 10*3/uL — ABNORMAL HIGH (ref 4.0–10.5)
WBC: 26.8 10*3/uL — ABNORMAL HIGH (ref 4.0–10.5)

## 2017-03-01 LAB — BASIC METABOLIC PANEL
Anion gap: 9 (ref 5–15)
BUN: 12 mg/dL (ref 6–20)
CO2: 30 mmol/L (ref 22–32)
Calcium: 8.7 mg/dL — ABNORMAL LOW (ref 8.9–10.3)
Chloride: 92 mmol/L — ABNORMAL LOW (ref 101–111)
Creatinine, Ser: 1.17 mg/dL — ABNORMAL HIGH (ref 0.44–1.00)
GFR calc Af Amer: 50 mL/min — ABNORMAL LOW (ref >60)
GFR calc non Af Amer: 43 mL/min — ABNORMAL LOW (ref >60)
Glucose, Bld: 165 mg/dL — ABNORMAL HIGH (ref 65–99)
Potassium: 4.6 mmol/L (ref 3.5–5.1)
Sodium: 131 mmol/L — ABNORMAL LOW (ref 135–145)

## 2017-03-01 MED ORDER — NICOTINE 14 MG/24HR TD PT24
14.0000 mg | MEDICATED_PATCH | Freq: Every day | TRANSDERMAL | Status: DC
  Administered 2017-03-01 – 2017-03-02 (×2): 14 mg via TRANSDERMAL
  Filled 2017-03-01 (×3): qty 1

## 2017-03-01 MED ORDER — OXYCODONE HCL 5 MG PO TABS
5.0000 mg | ORAL_TABLET | ORAL | Status: DC | PRN
  Administered 2017-03-01 (×2): 5 mg via ORAL
  Filled 2017-03-01 (×2): qty 1

## 2017-03-01 MED ORDER — HYDROCODONE-ACETAMINOPHEN 5-325 MG PO TABS
1.0000 | ORAL_TABLET | ORAL | Status: DC | PRN
  Administered 2017-03-01 – 2017-03-02 (×4): 1 via ORAL
  Filled 2017-03-01 (×4): qty 1

## 2017-03-01 MED ORDER — HYDROMORPHONE HCL 1 MG/ML IJ SOLN: 0.5000 mg | INTRAMUSCULAR | Status: DC | PRN

## 2017-03-01 MED ORDER — ALPRAZOLAM 0.25 MG PO TABS
0.2500 mg | ORAL_TABLET | Freq: Every evening | ORAL | Status: DC | PRN
  Administered 2017-03-01 (×2): 0.25 mg via ORAL
  Filled 2017-03-01 (×2): qty 1

## 2017-03-01 MED ORDER — ONDANSETRON HCL 4 MG/2ML IJ SOLN
4.0000 mg | Freq: Once | INTRAMUSCULAR | Status: AC
Start: 2017-03-01 — End: 2017-03-01
  Administered 2017-03-01: 4 mg via INTRAVENOUS

## 2017-03-01 NOTE — Progress Notes (Signed)
PROGRESS NOTE    Taylor Cannon  WER:[id number] DOB: [birth year] DOA: 02/28/2017 PCP: Elyn Aquas     Brief Narrative:  83 year old woman admitted to the hospital on 6/28 following a mechanical fall that resulted in a large left gluteal hematoma with significant pain requiring frequent IV pain medication. Has been seen by physical therapy with recommendations for SNF.   Assessment & Plan:   Principal Problem:   Hematoma Active Problems:   COPD (chronic obstructive pulmonary disease) ([place])   Tobacco abuse   Fall at home, initial encounter   Large gluteal hematoma -Still with significant pain, have discussed with patient and daughters that we will plan on transitioning her over to oral medications 2-gauge need for pain medication at home, we can still use IV narcotics if she still has pain that is uncontrolled with oral narcotics. -Has been seen by physical therapy with recommendations for SNF. -Will hold aspirin for now.  Thrombocytosis -It appears that as far back as 2016 patient has had a platelet count in the high 400s to 600s. She and her family are not aware of this. -Her platelet count however on admission is 1175. Have discussed this via phone with oncology, they recommend nothing to be done at present other than outpatient follow-up with them.   DVT prophylaxis: SCDs Code Status: Full code Family Communication: Discussed with 2 daughters at bedside Disposition Plan: SNF versus home in 24-48 hours  Consultants:   None  Procedures:   None  Antimicrobials:  Anti-infectives    None       Subjective: Lying in bed, in significant distress due to left gluteal pain and nausea  Objective: Vitals:   02/28/17 2300 03/01/17 0400 03/01/17 1045 03/01/17 1512  BP: 139/87 (!) 146/90  (!) 95/52  Pulse: (!) 111 (!) 19 (!) 110 (!) 102  Resp: 20 20  18   Temp: 98.2 F (36.8 C) 98.1 F (36.7 C)  98.3 F (36.8 C)  TempSrc: Oral Oral  Oral  SpO2: 96% 96%  96%    Weight: 47.5 kg (104 lb 12.8 oz)     Height:       No intake or output data in the 24 hours ending 03/01/17 1607 Filed Weights   02/28/17 1619 02/28/17 2300  Weight: 46.7 kg (103 lb) 47.5 kg (104 lb 12.8 oz)    Examination:  General exam: Alert, awake, oriented x 3 Respiratory system: Clear to auscultation. Respiratory effort normal. Cardiovascular system:RRR. No murmurs, rubs, gallops. Gastrointestinal system: Abdomen is nondistended, soft and nontender. No organomegaly or masses felt. Normal bowel sounds heard. Central nervous system: Alert and oriented. No focal neurological deficits.A little drowsy due to narcotics but awakens easily and answers questions appropriately. Extremities: No C/C/E, +pedal pulses Skin: No rashes, lesions or ulcers Psychiatry: Judgement and insight appear normal. Mood & affect appropriate.     Data Reviewed: I have personally reviewed following labs and imaging studies  CBC:  Recent Labs Lab 02/28/17 1709 03/01/17 0543 03/01/17 1111  WBC 11.8* 25.0* 26.8*  NEUTROABS 10.3*  --   --   HGB 14.8 12.9 12.2  HCT 44.8 39.6 37.6  MCV 80.1 81.1 81.6  PLT 584* 1,175* 1,950*   Basic Metabolic Panel:  Recent Labs Lab 02/28/17 1709 03/01/17 0543  NA 129* 131*  K 4.3 4.6  CL 94* 92*  CO2 26 30  GLUCOSE 159* 165*  BUN 9 12  CREATININE 0.71 1.17*  CALCIUM 8.9 8.7*   GFR: Estimated Creatinine Clearance: 28.8 mL/min (  A) (by C-G formula based on SCr of 1.17 mg/dL (H)). Liver Function Tests:  Recent Labs Lab 02/28/17 1709  AST 39  ALT 21  ALKPHOS 60  BILITOT 0.8  PROT 6.4*  ALBUMIN 3.8   No results for input(s): LIPASE, AMYLASE in the last 168 hours. No results for input(s): AMMONIA in the last 168 hours. Coagulation Profile:  Recent Labs Lab 02/28/17 1903  INR 1.18   Cardiac Enzymes: No results for input(s): CKTOTAL, CKMB, CKMBINDEX, TROPONINI in the last 168 hours. BNP (last 3 results) No results for input(s): PROBNP in the  last 8760 hours. HbA1C: No results for input(s): HGBA1C in the last 72 hours. CBG: No results for input(s): GLUCAP in the last 168 hours. Lipid Profile: No results for input(s): CHOL, HDL, LDLCALC, TRIG, CHOLHDL, LDLDIRECT in the last 72 hours. Thyroid Function Tests: No results for input(s): TSH, T4TOTAL, FREET4, T3FREE, THYROIDAB in the last 72 hours. Anemia Panel: No results for input(s): VITAMINB12, FOLATE, FERRITIN, TIBC, IRON, RETICCTPCT in the last 72 hours. Urine analysis:    Component Value Date/Time   COLORURINE YELLOW 09/16/2015 0930   APPEARANCEUR CLEAR 09/16/2015 0930   LABSPEC <1.005 (L) 09/16/2015 0930   PHURINE 5.5 09/16/2015 0930   GLUCOSEU NEGATIVE 09/16/2015 0930   HGBUR NEGATIVE 09/16/2015 0930   BILIRUBINUR NEGATIVE 09/16/2015 0930   KETONESUR NEGATIVE 09/16/2015 0930   PROTEINUR NEGATIVE 09/16/2015 0930   NITRITE NEGATIVE 09/16/2015 0930   LEUKOCYTESUR NEGATIVE 09/16/2015 0930   Sepsis Labs: @LABRCNTIP (procalcitonin:4,lacticidven:4)  )No results found for this or any previous visit (from the past 240 hour(s)).       Radiology Studies: Ct Head Wo Contrast  Result Date: 02/28/2017 CLINICAL DATA:  Fall yesterday.  Left hip pain. EXAM: CT HEAD WITHOUT CONTRAST TECHNIQUE: Contiguous axial images were obtained from the base of the skull through the vertex without intravenous contrast. COMPARISON:  September 16, 2015 FINDINGS: Brain: No evidence of acute infarction, hemorrhage, hydrocephalus, extra-axial collection or mass lesion/mass effect. Vascular: No hyperdense vessel or unexpected calcification. Skull: Normal. Negative for fracture or focal lesion. Sinuses/Orbits: Debris is seen in the posterior right ethmoid air cell. Mucous retention cysts are seen in the maxillary sinuses. No acute paranasal sinus abnormalities. Mastoid air cells and middle ears are well aerated. Other: None. IMPRESSION: No acute abnormalities are identified. Electronically Signed   By:  Dorise Bullion III M.D   On: 02/28/2017 18:08   Ct Pelvis Wo Contrast  Result Date: 02/28/2017 CLINICAL DATA:  Left hip pain following a fall today. EXAM: CT PELVIS WITHOUT CONTRAST TECHNIQUE: Multidetector CT imaging of the pelvis was performed following the standard protocol without intravenous contrast. COMPARISON:  Left hip radiographs obtained earlier today. FINDINGS: Urinary Tract:  No abnormality visualized. Bowel:  Multiple colonic diverticula. Vascular/Lymphatic: Extensive atheromatous arterial calcifications. Reproductive:  Unremarkable uterus and ovaries. Other: Very large left gluteus maximus muscle hematoma. This measures 17.2 x 10.0 cm in maximum dimensions on coronal images 66 and 65 respectively. This measures 10.1 cm in AP dimension on axial image number 83 of series 4. There is a significantly smaller hematoma in the adjacent piriformis muscle on the left. Calcified granuloma in the liver. Musculoskeletal: Previously noted left hip fixation hardware. No fracture or dislocation seen. Lower lumbar spine degenerative changes. IMPRESSION: 1. No fracture or dislocation. 2. Very large left gluteus maximus muscle hematoma and smaller left piriformis muscle hematoma. 3. Extensive colonic diverticulosis. Electronically Signed   By: Claudie Revering M.D.   On: 02/28/2017 18:27  Dg Hip Unilat With Pelvis 2-3 Views Left  Result Date: 02/28/2017 CLINICAL DATA:  Left hip pain after falling EXAM: DG HIP (WITH OR WITHOUT PELVIS) 2-3V LEFT COMPARISON:  Left hip radiograph 12/20/2015 FINDINGS: There are is an antegrade left femoral intramedullary nail with interlocking component in the femoral neck. No periprosthetic fracture or abnormal lucency. The left hip remains approximated. Right hip is unremarkable. No pelvic diastasis. IMPRESSION: No acute fracture or dislocation of the left hip. Electronically Signed   By: Ulyses Jarred M.D.   On: 02/28/2017 17:01        Scheduled Meds: . nicotine  14 mg  Transdermal Daily  . pantoprazole  40 mg Oral Daily  . sodium chloride flush  3 mL Intravenous Q12H   Continuous Infusions: . dextrose 5 % and 0.9% NaCl 50 mL/hr at 02/28/17 2338     LOS: 0 days    Time spent: 25 minutes. Greater than 50% of this time was spent in direct contact with the patient coordinating care.     Lelon Frohlich, MD [facility] Pager [phone]  If 7PM-7AM, please contact night-coverage [email or web address] Password [email or web address] 03/01/2017, 4:07 PM

## 2017-03-01 NOTE — Clinical Social Work Note (Signed)
Clinical Social Work Assessment  Patient Details  Name: Taylor Cannon MRN: [id number] Date of Birth: 1934-03-19  Date of referral:  03/01/17               Reason for consult:  Discharge Planning                Permission sought to share information with:    Permission granted to share information::     Name::        Agency::     Relationship::     Contact Information:  [facility] Sonia [facility]   Housing/Transportation Living arrangements for the past 2 months:  Single Family Home Source of Information:  Patient, Adult Children Patient Interpreter Needed:  None Criminal Activity/Legal Involvement Pertinent to Current Situation/Hospitalization:  No - Comment as needed Significant Relationships:  Adult Children Lives with:  Self Do you feel safe going back to the place where you live?  No Need for family participation in patient care:  No (Coment)  Care giving concerns:  None identified at baseline.   Social Worker assessment / plan:  Patient lives alone. Her daughter [name] lives about twelve steps from patient. Patient is independent at baseline. Patient is agreeable to SNF and is interested in [place] and [facility] both in [place]. LCSW discussed insurance authorization may be a lengthy process.   Employment status:  Retired Nurse, adult PT Recommendations:  [place] / Referral to community resources:  [place]  Patient/Family's Response to care:  Patient and family are agreeable.   Patient/Family's Understanding of and Emotional Response to Diagnosis, Current Treatment, and Prognosis:  Patient understands his diagnosis, treatment and prognosis.   Emotional Assessment Appearance:  Appears stated age Attitude/Demeanor/Rapport:    Affect (typically observed):  Accepting, Calm Orientation:  Oriented to Self, Oriented to Place, Oriented to  Time, Oriented to Situation Alcohol / Substance use:  Not  Applicable Psych involvement (Current and /or in the community):  No (Comment)  Discharge Needs  Concerns to be addressed:  No discharge needs identified Readmission within the last 30 days:  No Current discharge risk:  Lives alone Barriers to Discharge:  Insurance Authorization   Ihor Gully, LCSW 03/01/2017, 12:42 PM

## 2017-03-01 NOTE — [place] FL2 (Signed)
[place] MEDICAID FL2 LEVEL OF CARE SCREENING TOOL     IDENTIFICATION  Patient Name: Taylor Cannon Birthdate: 01-02-1934 Sex: female Admission Date (Current Location): 02/28/2017  [facility] and [place] Number:  [employer] and Address:  [place] [address] [place]      Provider Number: [id number]  Attending Physician Name and Address:  Isaac Bliss, [place]  Relative Name and Phone Number:       Current Level of Care: Hospital Recommended Level of Care: [place] Prior Approval Number:    Date Approved/Denied:   PASRR Number:    Discharge Plan: SNF    Current Diagnoses: Patient Active Problem List   Diagnosis Date Noted  . Fall at home, initial encounter 02/28/2017  . Hematoma 02/28/2017  . Esophageal stricture 04/11/2016  . IDA (iron deficiency anemia) 04/11/2016  . Intertrochanteric fracture of left femur ([place]) 09/18/2015  . Closed fracture of intertrochanteric section of femur ([place])   . Protein-calorie malnutrition, severe 09/17/2015  . Closed left hip fracture ([place]) 09/16/2015  . COPD (chronic obstructive pulmonary disease) ([place]) 09/16/2015  . Tobacco abuse 09/16/2015  . GERD (gastroesophageal reflux disease) 09/16/2015  . Fall 09/16/2015  . Dysphagia, pharyngoesophageal phase 10/05/2014  . Benign paroxysmal positional vertigo 10/05/2014    Orientation RESPIRATION BLADDER Height & Weight     Self, Time, Situation, Place  Normal Continent Weight: 104 lb 12.8 oz (47.5 kg) Height:  5\' 8"  (172.7 cm)  BEHAVIORAL SYMPTOMS/MOOD NEUROLOGICAL BOWEL NUTRITION STATUS      Continent Diet (Regular)  AMBULATORY STATUS COMMUNICATION OF NEEDS Skin   Limited Assist Verbally Normal                       Personal Care Assistance Level of Assistance  Bathing, Feeding, Dressing Bathing Assistance: Limited assistance Feeding assistance: Independent Dressing Assistance: Limited assistance      Functional Limitations Info  Sight, Hearing, Speech Sight Info: Adequate Hearing Info: Adequate Speech Info: Adequate    SPECIAL CARE FACTORS FREQUENCY  PT (By licensed PT)     PT Frequency: 5x/week              Contractures Contractures Info: Not present    Additional Factors Info  Code Status, Allergies, Psychotropic Code Status Info: Full Code Allergies Info: Other, Oxycodone Psychotropic Info: Xanax         Current Medications (03/01/2017):  This is the current hospital active medication list Current Facility-Administered Medications  Medication Dose Route Frequency Provider Last Rate Last Dose  . ALPRAZolam Duanne Moron) tablet 0.25 mg  0.25 mg Oral QHS PRN Oswald Hillock, MD   0.25 mg at 03/01/17 0205  . bisacodyl (DULCOLAX) EC tablet 5 mg  5 mg Oral Daily PRN Orvan Falconer, MD      . dextrose 5 %-0.9 % sodium chloride infusion   Intravenous Continuous Orvan Falconer, MD 50 mL/hr at 02/28/17 2338    . HYDROmorphone (DILAUDID) injection 0.5 mg  0.5 mg Intravenous Q4H PRN Isaac Bliss, Rayford Halsted, MD      . nicotine (NICODERM CQ - dosed in mg/24 hours) patch 14 mg  14 mg Transdermal Daily Oswald Hillock, MD   14 mg at 03/01/17 0955  . ondansetron (ZOFRAN) tablet 4 mg  4 mg Oral Q6H PRN Orvan Falconer, MD       Or  . ondansetron [facility]) injection 4 mg  4 mg Intravenous Q6H PRN Orvan Falconer, MD  4 mg at 03/01/17 0610  . oxyCODONE (Oxy IR/ROXICODONE) immediate release tablet 5 mg  5 mg Oral Q4H PRN Isaac Bliss, Rayford Halsted, MD   5 mg at 03/01/17 1252  . pantoprazole (PROTONIX) EC tablet 40 mg  40 mg Oral Daily Orvan Falconer, MD   40 mg at 03/01/17 0954  . sodium chloride flush (NS) 0.9 % injection 3 mL  3 mL Intravenous Q12H Orvan Falconer, MD         Discharge Medications: Please see discharge summary for a list of discharge medications.  Relevant Imaging Results:  Relevant Lab Results:   Additional Information SS#: [id number]  Ihor Gully, LCSW

## 2017-03-01 NOTE — Evaluation (Signed)
Physical Therapy Evaluation Patient Details Name: Taylor Cannon MRN: [id number] DOB: [birth year] Today's Date: 03/01/2017   History of Present Illness  Emilie Stotts is an 83yo white female who comes to ED on 6/28 after fall at home noted to have large hematoma of Left glutemax and Left piriformis.Pelvis CT negative for acute abnormality, Hip CT demonstrates no changes in ORIF hardward placement, negative for acute fracture. PMH: hip ORIF (17MA), COPD on home O2, GAD.   Clinical Impression  Pt admitted with above diagnosis. Pt currently with functional limitations due to the deficits listed below (see "PT Problem List"). Upon entry, the patient is received semirecumbent in bed, two daughters present. The pt is awake and agreeable to participate, although in significant pain and nauseated. Pt remains tachycardic throughout session 105-115bpm. Functional mobility assessment demonstrates moderate weakness, the pt now requiring additional time and effort to perform bed mobility, however she is also heavily limited by nausea, whereas the patient performs these independently at baseline. Pt is unable to perform 5 steps at egress upon DC, nor able to tolerate any meaningful AMB distance and will benefit from high volume, moderate intensity rehab to quickly progress pt back to her high PLOF.  Pt will benefit from skilled PT intervention to increase independence and safety with basic mobility in preparation for discharge to the venue listed below.        Follow Up Recommendations SNF    Equipment Recommendations  None recommended by PT    Recommendations for Other Services       Precautions / Restrictions Precautions Precautions: None Restrictions Weight Bearing Restrictions: No      Mobility  Bed Mobility Overal bed mobility: Needs Assistance Bed Mobility: Supine to Sit     Supine to sit: Supervision     General bed mobility comments: limtied by pain in hip; also by nausea.    Transfers                 General transfer comment: unable to attempt due to worsening nausea; pain appears controlled 7/10.   Ambulation/Gait                Stairs            Wheelchair Mobility    Modified Rankin (Stroke Patients Only)       Balance Overall balance assessment: History of Falls;Needs assistance (2 falls in past 3 months ) Sitting-balance support: No upper extremity supported;Feet supported Sitting balance-Leahy Scale: Good                                       Pertinent Vitals/Pain Pain Assessment: 0-10 Pain Score: 7  Pain Location: left hip  Pain Descriptors / Indicators: Aching;Cramping Pain Intervention(s): Limited activity within patient's tolerance;Monitored during session;Premedicated before session    Home Living Family/patient expects to be discharged to:: Private residence Living Arrangements: Alone Available Help at Discharge: Family Type of Home: Mobile home Home Access: Stairs to enter Entrance Stairs-Rails: Can reach both Entrance Stairs-Number of Steps: 5 stairs Home Layout: One level Home Equipment: Environmental consultant - 2 wheels;Cane - single point;Bedside commode;Wheelchair - manual Additional Comments: PTA pt indep in ADL and IADL, drive occasionally.     Prior Function Level of Independence: Independent         Comments: O2 overnight      Hand Dominance        Extremity/Trunk Assessment  Upper Extremity Assessment Upper Extremity Assessment: Generalized weakness    Lower Extremity Assessment Lower Extremity Assessment: Generalized weakness       Communication   Communication: No difficulties  Cognition Arousal/Alertness: Awake/alert Behavior During Therapy: WFL for tasks assessed/performed Overall Cognitive Status: Within Functional Limits for tasks assessed                                        General Comments      Exercises     Assessment/Plan    PT  Assessment Patient needs continued PT services  PT Problem List Pain;Decreased mobility;Decreased activity tolerance;Decreased range of motion;Decreased strength       PT Treatment Interventions Stair training;Functional mobility training;Gait training;Therapeutic activities;Therapeutic exercise;Balance training;Cognitive remediation;Patient/family education    PT Goals (Current goals can be found in the Care Plan section)  Acute Rehab PT Goals Patient Stated Goal: decrease pain and nausea PT Goal Formulation: With patient Time For Goal Achievement: 03/15/17 Potential to Achieve Goals: Fair    Frequency Min 2X/week   Barriers to discharge Inaccessible home environment has 5 steps to enter    Co-evaluation               AM-PAC PT "6 Clicks" Daily Activity  Outcome Measure Difficulty turning over in bed (including adjusting bedclothes, sheets and blankets)?: A Lot Difficulty moving from lying on back to sitting on the side of the bed? : A Lot Difficulty sitting down on and standing up from a chair with arms (e.g., wheelchair, bedside commode, etc,.)?: Total Help needed moving to and from a bed to chair (including a wheelchair)?: Total Help needed walking in hospital room?: Total Help needed climbing 3-5 steps with a railing? : Total 6 Click Score: 8    End of Session   Activity Tolerance: Patient tolerated treatment well;Patient limited by pain;Other (comment) (nausea ) Patient left: in bed;with call bell/phone within reach;with nursing/sitter in room;with family/visitor present Nurse Communication: Other (comment) (IV not working properly ) PT Visit Diagnosis: History of falling (Z91.81);Difficulty in walking, not elsewhere classified (R26.2)    Time: 3254-9826 PT Time Calculation (min) (ACUTE ONLY): 17 min   Charges:         PT G Codes:   PT G-Codes **NOT FOR INPATIENT CLASS** Functional Assessment Tool Used: AM-PAC 6 Clicks Basic Mobility;Clinical  judgement Functional Limitation: Mobility: Walking and moving around Mobility: Walking and Moving Around Current Status (E1583): At least 80 percent but less than 100 percent impaired, limited or restricted Mobility: Walking and Moving Around Goal Status [phone]): At least 80 percent but less than 100 percent impaired, limited or restricted    11:12 AM, 03/01/17 Etta Grandchild, PT, DPT Physical Therapist - [place] [phone] (Office)    Buccola,Allan C 03/01/2017, 11:08 AM

## 2017-03-01 NOTE — Care Management Obs Status (Signed)
[place] NOTIFICATION   Patient Details  Name: Taylor Cannon MRN: [id number] Date of Birth: 09-Oct-1933   Medicare Observation Status Notification Given:  Yes    Sherald Barge, RN 03/01/2017, 9:32 AM

## 2017-03-01 NOTE — Care Management Note (Signed)
Case Management Note  Patient Details  Name: Taylor Cannon MRN: [id number] Date of Birth: 1934/07/09  Subjective/Objective:                  Admitted after fall at home. Pt is in need of pain control. She has ORIF in the past, unable to do MRI to determine if that has been injured. P's daughters and POA at bedside. Pt normally lives alone, has daughter near by. She does her own bathing and dressing. She has lady who cleans for her. She has walker and BSC but has not needed to use the RW for a few weeks. She was previously in [facility] after ORIF. Pt/family want pt to return home if her pain can be controlled and she could ambulate. Family could be with her intermittently during the day but unable to provide 24/7 care. If pt unable to ambulate family would like STR again. Pt reports her pain is still not controlled and complaining of nausea, holding emisis bag and holding stomach.  Action/Plan: CM will cont to follow, PT eval pending.   Expected Discharge Date:      03/02/2017            Expected Discharge Plan:  [place] (vs SNF)  In-House Referral:  Clinical Social Work  Discharge planning Services  CM Consult  Status of Service:  In process, will continue to follow  Sherald Barge, RN 03/01/2017, 9:28 AM

## 2017-03-02 DIAGNOSIS — T148XXA Other injury of unspecified body region, initial encounter: Secondary | ICD-10-CM | POA: Diagnosis not present

## 2017-03-02 MED ORDER — ONDANSETRON 4 MG PO TBDP: 4.0000 mg | ORAL_TABLET | Freq: Three times a day (TID) | ORAL | 0 refills | Status: DC | PRN

## 2017-03-02 MED ORDER — HYDROCODONE-ACETAMINOPHEN 5-325 MG PO TABS: 0.5000 | ORAL_TABLET | ORAL | 0 refills | Status: DC | PRN

## 2017-03-02 NOTE — Progress Notes (Signed)
Report called and given to Cecille Rubin, Therapist, sports at [facility] facility.

## 2017-03-02 NOTE — Discharge Summary (Signed)
Physician Discharge Summary  Taylor Cannon JAS:[id number] DOB: 09/15/1933 DOA: 02/28/2017  PCP: Taylor Cannon  Admit date: 02/28/2017 Discharge date: 03/02/2017  Time spent: 45 minutes  Recommendations for Outpatient Follow-up:  -Will be discharged to SNF today for ST-rehab.   Discharge Diagnoses:  Principal Problem:   Hematoma Active Problems:   COPD (chronic obstructive pulmonary disease) ([place])   Tobacco abuse   Fall at home, initial encounter   Discharge Condition: Stable and improved  Filed Weights   02/28/17 1619 02/28/17 2300  Weight: 46.7 kg (103 lb) 47.5 kg (104 lb 12.8 oz)    History of present illness:  As per Dr. Marin Comment on 6/28: Taylor Cannon is an 83 y.o. female with hx of tobacco abuse, COPD on home oxygen, hx of anxiety, weight loss, s/p ORIF of the left hip, fell (mechanical fall), and hurt her right hip.  CT of the hip showed a large hematoma (17 x 10 cm) involving the gluteus maximus, and smaller one involving the periformis muscle.  There is no evidence of any Fx or dislocation, and prior hardware was seen.  Her Hb is stable at 14.8 g per dL.  She is not hypotensive, and her Cr is normal.  She has a NA of 129.  Unfortunately, her pain is severe, and she cannot ambulate.  She lives alone with family nearby.  Hospitalist was asked to admit her for pain management, and in consideration of MRI imaging to be sure there is stability of her prior ORIF.        Hospital Course:   Large gluteal hematoma -ASA on hold for now. -Pain is well controlled on hydrocodone. -Will DC to SNF as per PT recommendations.  Thrombocytosis -It appears that as far back as 2016 patient has had a platelet count in the high 400s to 600s. She and her family are not aware of this. -Her platelet count however on admission is 1175. Have discussed this via phone with oncology, they recommend nothing to be done at present other than outpatient follow-up with them.  Procedures:  None    Consultations:  None  Discharge Instructions  Discharge Instructions    Increase activity slowly    Complete by:  As directed      Allergies as of 03/02/2017      Reactions   Other Nausea Only   Steroids   Oxycodone Nausea And Vomiting      Medication List    STOP taking these medications   aspirin EC 81 MG tablet     TAKE these medications   ALPRAZolam 0.25 MG tablet Commonly known as:  XANAX Take 1 tablet (0.25 mg total) by mouth 2 (two) times daily as needed for anxiety. What changed:  how much to take  reasons to take this   calcium carbonate 1500 (600 Ca) MG Tabs tablet Commonly known as:  OSCAL Take 600 mg of elemental calcium by mouth daily.   HYDROcodone-acetaminophen 5-325 MG tablet Commonly known as:  NORCO/VICODIN Take 0.5-1 tablets by mouth every 4 (four) hours as needed for moderate pain. What changed:  when to take this   omeprazole 20 MG capsule Commonly known as:  PRILOSEC Take 1 capsule (20 mg total) by mouth daily.   ondansetron 4 MG disintegrating tablet Commonly known as:  ZOFRAN ODT Take 1 tablet (4 mg total) by mouth every 8 (eight) hours as needed for nausea or vomiting.   OXYGEN Inhale 2 L into the lungs at bedtime.  vitamin C 500 MG tablet Commonly known as:  ASCORBIC ACID Take 500 mg by mouth daily.      Allergies  Allergen Reactions  . Other Nausea Only    Steroids  . Oxycodone Nausea And Vomiting      The results of significant diagnostics from this hospitalization (including imaging, microbiology, ancillary and laboratory) are listed below for reference.    Significant Diagnostic Studies: Ct Head Wo Contrast  Result Date: 02/28/2017 CLINICAL DATA:  Fall yesterday.  Left hip pain. EXAM: CT HEAD WITHOUT CONTRAST TECHNIQUE: Contiguous axial images were obtained from the base of the skull through the vertex without intravenous contrast. COMPARISON:  September 16, 2015 FINDINGS: Brain: No evidence of acute infarction,  hemorrhage, hydrocephalus, extra-axial collection or mass lesion/mass effect. Vascular: No hyperdense vessel or unexpected calcification. Skull: Normal. Negative for fracture or focal lesion. Sinuses/Orbits: Debris is seen in the posterior right ethmoid air cell. Mucous retention cysts are seen in the maxillary sinuses. No acute paranasal sinus abnormalities. Mastoid air cells and middle ears are well aerated. Other: None. IMPRESSION: No acute abnormalities are identified. Electronically Signed   By: Dorise Bullion III M.D   On: 02/28/2017 18:08   Ct Pelvis Wo Contrast  Result Date: 02/28/2017 CLINICAL DATA:  Left hip pain following a fall today. EXAM: CT PELVIS WITHOUT CONTRAST TECHNIQUE: Multidetector CT imaging of the pelvis was performed following the standard protocol without intravenous contrast. COMPARISON:  Left hip radiographs obtained earlier today. FINDINGS: Urinary Tract:  No abnormality visualized. Bowel:  Multiple colonic diverticula. Vascular/Lymphatic: Extensive atheromatous arterial calcifications. Reproductive:  Unremarkable uterus and ovaries. Other: Very large left gluteus maximus muscle hematoma. This measures 17.2 x 10.0 cm in maximum dimensions on coronal images 66 and 65 respectively. This measures 10.1 cm in AP dimension on axial image number 83 of series 4. There is a significantly smaller hematoma in the adjacent piriformis muscle on the left. Calcified granuloma in the liver. Musculoskeletal: Previously noted left hip fixation hardware. No fracture or dislocation seen. Lower lumbar spine degenerative changes. IMPRESSION: 1. No fracture or dislocation. 2. Very large left gluteus maximus muscle hematoma and smaller left piriformis muscle hematoma. 3. Extensive colonic diverticulosis. Electronically Signed   By: Claudie Revering M.D.   On: 02/28/2017 18:27   Dg Hip Unilat With Pelvis 2-3 Views Left  Result Date: 02/28/2017 CLINICAL DATA:  Left hip pain after falling EXAM: DG HIP (WITH  OR WITHOUT PELVIS) 2-3V LEFT COMPARISON:  Left hip radiograph 12/20/2015 FINDINGS: There are is an antegrade left femoral intramedullary nail with interlocking component in the femoral neck. No periprosthetic fracture or abnormal lucency. The left hip remains approximated. Right hip is unremarkable. No pelvic diastasis. IMPRESSION: No acute fracture or dislocation of the left hip. Electronically Signed   By: Ulyses Jarred M.D.   On: 02/28/2017 17:01    Microbiology: No results found for this or any previous visit (from the past 240 hour(s)).   Labs: Basic Metabolic Panel:  Recent Labs Lab 02/28/17 1709 03/01/17 0543  NA 129* 131*  K 4.3 4.6  CL 94* 92*  CO2 26 30  GLUCOSE 159* 165*  BUN 9 12  CREATININE 0.71 1.17*  CALCIUM 8.9 8.7*   Liver Function Tests:  Recent Labs Lab 02/28/17 1709  AST 39  ALT 21  ALKPHOS 60  BILITOT 0.8  PROT 6.4*  ALBUMIN 3.8   No results for input(s): LIPASE, AMYLASE in the last 168 hours. No results for input(s): AMMONIA in the last  168 hours. CBC:  Recent Labs Lab 02/28/17 1709 03/01/17 0543 03/01/17 1111  WBC 11.8* 25.0* 26.8*  NEUTROABS 10.3*  --   --   HGB 14.8 12.9 12.2  HCT 44.8 39.6 37.6  MCV 80.1 81.1 81.6  PLT 584* 1,175* 1,090*   Cardiac Enzymes: No results for input(s): CKTOTAL, CKMB, CKMBINDEX, TROPONINI in the last 168 hours. BNP: BNP (last 3 results) No results for input(s): BNP in the last 8760 hours.  ProBNP (last 3 results) No results for input(s): PROBNP in the last 8760 hours.  CBG: No results for input(s): GLUCAP in the last 168 hours.     SignedLelon Frohlich  [facility] Pager: [phone] 03/02/2017, 12:11 PM

## 2017-03-02 NOTE — Progress Notes (Signed)
Pt IV removed, WNL. D/C instructions given to pt. Verbalized understanding. EMS present to transport pt to [facility].

## 2017-03-04 LAB — PATHOLOGIST SMEAR REVIEW

## 2017-03-25 ENCOUNTER — Encounter (INDEPENDENT_AMBULATORY_CARE_PROVIDER_SITE_OTHER): Payer: Self-pay | Admitting: Internal Medicine

## 2017-03-25 ENCOUNTER — Ambulatory Visit (INDEPENDENT_AMBULATORY_CARE_PROVIDER_SITE_OTHER): Payer: Medicare HMO | Admitting: Internal Medicine

## 2017-03-25 VITALS — BP 118/60 | HR 72 | Temp 97.8°F | Ht 68.0 in | Wt 104.0 lb

## 2017-03-25 DIAGNOSIS — K5900 Constipation, unspecified: Secondary | ICD-10-CM

## 2017-03-25 NOTE — Patient Instructions (Addendum)
Will wait on labs from her PCP.

## 2017-03-25 NOTE — Progress Notes (Signed)
Subjective:    Patient ID: Taylor Cannon, female    DOB: March 24, 1934, 83 y.o.   MRN: [id number]  PCP Dr Elyn Aquas.  She is living with her daughter at this time.   HPI Daughter brig her mother in with c/o constipation and decreased appetite. Since making the appt, her mother's appetite has improved. She is eating 3 meals a day. She is still in a w/c due to the pain in her left hip.   Admitted to [facility] end of June of this year after suffering a fall at home. She was unable to ambulate. CT scan revealed a large hematoma involving the gluteus maximus and a smaller one involving the piriformis. muscle. There was no evidence of fx or dislocation. Hemoglobin was stable at 14.8 on discharge. She was discharged to [facility] for Rehab. She had a CBC while at Rehab and was low 7.3. She was admitted to [facility] 03/05/2017 for acute blood loss. She received 2 units of blood.    From records at [facility] in [place], she became constipated while in the hospital and received colace supp. Magnesium citrate and an enema. She did have a BMs after receiving Golyt[place].   Daughter states mother could not eat very much after discharge. She is eating 3 meals a day now. She has gained from 99 lbs in January to 104 today. She denies dysphagia. Hx of mutliple EGD/ED in the past.  She tells me her stools have been hard. She has been taking a stool softener daily. Having one BM a day.    Hemoglobin last week at [facility] was 10 on discharge.  She was discharged from [facility] 03/22/2017.  Quit smoking since suffering the fall.   CT scan abdomen/pelvic with IV contrast: 03/05/2017 Soft tissue edema nad hematoma the left posterior lateral hemithorax and left flank with large partial  visualized soft tissue hematoma of the left upper buttock and posterior thigh.  Findings suggestive of chronic liver disease with portal hypertension .  03/05/2017 H and  H 8.8 and 29.1  Hx of high grade distal stricture and known hiatal  hernia.  Hx of multiple EGD/ED in the past for dysphagia.  Review of Systems Past Medical History:  Diagnosis Date  . Anxiety   . COPD (chronic obstructive pulmonary disease) ([place])   . Depression   . GERD (gastroesophageal reflux disease)   . On home oxygen therapy    2L via [place] at night  . TIA (transient ischemic attack)     Past Surgical History:  Procedure Laterality Date  . APPENDECTOMY    . BALLOON DILATION N/A 07/24/2013   Procedure: BALLOON DILATION to 13.60mm;  Surgeon: Rogene Houston, MD;  Location: [facility] ORS;  Service: Endoscopy;  Laterality: N/A;  . BALLOON DILATION N/A 11/11/2014   Procedure: BALLOON DILATION;  Surgeon: Rogene Houston, MD;  Location: [facility] ENDO SUITE;  Service: Endoscopy;  Laterality: N/A;  . CARDIAC ELECTROPHYSIOLOGY STUDY AND ABLATION    . CATARACT EXTRACTION Bilateral   . CERVICAL CONE BIOPSY    . ESOPHAGEAL DILATION N/A 11/30/2014   Procedure: ESOPHAGEAL DILATION;  Surgeon: Rogene Houston, MD;  Location: [facility] ORS;  Service: Endoscopy;  Laterality: N/A;  Balloon, 15, 16.5  . ESOPHAGEAL DILATION N/A 06/03/2015   Procedure: ESOPHAGEAL DILATION WITH 16.5 MM BALLOON ;  Surgeon: Rogene Houston, MD;  Location: [facility] ORS;  Service: Endoscopy;  Laterality: N/A;  . ESOPHAGEAL DILATION N/A 09/09/2015   Procedure: ESOPHAGEAL DILATION;  Surgeon:  Rogene Houston, MD;  Location: [facility] ENDO SU[facility];  Service: Endoscopy;  Laterality: N/A;  . ESOPHAGEAL DILATION N/A 05/04/2016   Procedure: ESOPHAGEAL DILATION;  Surgeon: Rogene Houston, MD;  Location: [facility] END[facility];  Service: Endoscopy;  Laterality: N/A;  . ESOPHAGEAL DILATION N/A 06/22/2016   Procedure: ESOPHAGEAL DILATION;  Surgeon: Rogene Houston, MD;  Location: [facility] END[facility];  Service: Endoscopy;  Laterality: N/A;  . ESOPHAGOGASTRODUODENOSCOPY N/A 11/11/2014   Procedure: ESOPHAGOGASTRODUODENOSCOPY (EGD);  Surgeon: Rogene Houston, MD;  Location: [facility];  Service: Endoscopy;  Laterality: N/A;  125-rescheduled 11/11/14 @ 10:30am  Ann notified pt  . ESOPHAGOGASTRODUODENOSCOPY (EGD) WITH ESOPHAGEAL DILATION N/A 08/20/2013   Procedure: ESOPHAGOGASTRODUODENOSCOPY (EGD) WITH ESOPHAGEAL DILATION;  Surgeon: Rogene Houston, MD;  Location: [facility];  Service: Endoscopy;  Laterality: N/A;  345-moved to 730 Ann notified pt  . ESOPHAGOGASTRODUODENOSCOPY (EGD) WITH PROPOFOL N/A 07/24/2013   Procedure: ESOPHAGOGASTRODUODENOSCOPY (EGD) WITH PROPOFOL UNDER FLUOROSCOPY;  Surgeon: Rogene Houston, MD;  Location: [facility];  Service: Endoscopy;  Laterality: N/A;  . ESOPHAGOGASTRODUODENOSCOPY (EGD) WITH PROPOFOL N/A 11/30/2014   Procedure: ESOPHAGOGASTRODUODENOSCOPY (EGD) WITH PROPOFOL;  Surgeon: Rogene Houston, MD;  Location: [facility];  Service: Endoscopy;  Laterality: N/A;  fluoro not needed  . ESOPHAGOGASTRODUODENOSCOPY (EGD) WITH PROPOFOL N/A 06/03/2015   Procedure: ESOPHAGOGASTRODUODENOSCOPY (EGD) WITH PROPOFOL; HIATUS AT 35 CM AND GE JUNCTION 40 CM;  Surgeon: Rogene Houston, MD;  Location: [facility];  Service: Endoscopy;  Laterality: N/A;  . ESOPHAGOGASTRODUODENOSCOPY (EGD) WITH PROPOFOL N/A 09/09/2015   Procedure: ESOPHAGOGASTRODUODENOSCOPY (EGD) WITH PROPOFOL;  Surgeon: Rogene Houston, MD;  Location: [facility];  Service: Endoscopy;  Laterality: N/A;  8:15  . ESOPHAGOGASTRODUODENOSCOPY (EGD) WITH PROPOFOL N/A 05/04/2016   Procedure: ESOPHAGOGASTRODUODENOSCOPY (EGD) WITH PROPOFOL;  Surgeon: Rogene Houston, MD;  [facility];  Service: Endoscopy;  Laterality: N/A;  9:30  . ESOPHAGOGASTRODUODENOSCOPY (EGD) WITH PROPOFOL N/A 06/22/2016   Procedure: ESOPHAGOGASTRODUODENOSCOPY (EGD) WITH PROPOFOL;  Surgeon: Rogene Houston, MD;  Location: [facility];  Service: Endoscopy;  Laterality: N/A;  11:20  . INTRAMEDULLARY (IM) NAIL INTERTROCHANTERIC Left 09/18/2015   Procedure: INTRAMEDULLARY (IM) NAIL INTERTROCHANTRIC;  Surgeon: Carole Civil, MD;  Location: [facility] ORS;  Service: Orthopedics;  Laterality: Left;  . TUBAL LIGATION       Allergies  Allergen Reactions  . Other Nausea Only    Steroids  . Oxycodone Nausea And Vomiting    Current Outpatient Prescriptions on File Prior to Visit  Medication Sig Dispense Refill  . ALPRAZolam (XANAX) 0.25 MG tablet Take 1 tablet (0.25 mg total) by mouth 2 (two) times daily as needed for anxiety. (Patient taking differently: Take 0.125-0.25 mg by mouth 2 (two) times daily as needed for anxiety or sleep. ) 30 tablet 0  . calcium carbonate (OSCAL) 1500 (600 Ca) MG TABS tablet Take 600 mg of elemental calcium by mouth daily.    Marland Kitchen omeprazole (PRILOSEC) 20 MG capsule Take 1 capsule (20 mg total) by mouth daily. 90 capsule 3  . OXYGEN Inhale 2 L into the lungs at bedtime.    . vitamin C (ASCORBIC ACID) 500 MG tablet Take 500 mg by mouth daily.    . ondansetron (ZOFRAN ODT) 4 MG disintegrating tablet Take 1 tablet (4 mg total) by mouth every 8 (eight) hours as needed for nausea or vomiting. (Patient not taking: Reported on 03/25/2017) 20 tablet 0   No current facility-administered medications on file prior to visit.  Objective:   Physical Exam Blood pressure 118/60, pulse 72, temperature 97.8 F (36.6 C), height 5\' 8"  (1.727 m), weight 104 lb (47.2 kg). Alert and oriented. Skin warm and dry. Oral mucosa is moist.   . Sclera anicteric, conjunctivae is pink. Thyroid not enlarged. No cervical lymphadenopathy. Lungs clear. Heart regular rate and rhythm.  Abdomen is soft. Bowel sounds are positive. No hepatomegaly. No abdominal masses felt. No tenderness.  No edema to lower extremities.    Large old bruise to left back and very light colored bruise to her left hipl          Assessment & Plan:  Constipation.: Take stool softener twice a day.  Daughter states she is seeing her PCP tomorrow and will have blood work drawn at their office and have it faxed to our office.  Will wait on labs work.

## 2017-04-18 ENCOUNTER — Other Ambulatory Visit (HOSPITAL_COMMUNITY)
Admission: RE | Admit: 2017-04-18 | Discharge: 2017-04-18 | Disposition: A | Discharge status: Home / Self Care | Payer: Medicare HMO | Source: Other Acute Inpatient Hospital | Attending: Family Medicine | Admitting: Family Medicine

## 2017-04-18 DIAGNOSIS — E871 Hypo-osmolality and hyponatremia: Secondary | ICD-10-CM | POA: Diagnosis present

## 2017-04-18 DIAGNOSIS — D500 Iron deficiency anemia secondary to blood loss (chronic): Secondary | ICD-10-CM | POA: Insufficient documentation

## 2017-04-18 DIAGNOSIS — M6281 Muscle weakness (generalized): Secondary | ICD-10-CM | POA: Diagnosis present

## 2017-04-18 LAB — CBC WITH DIFFERENTIAL/PLATELET
Basophils Absolute: 0 10*3/uL (ref 0.0–0.1)
Basophils Relative: 0 %
Eosinophils Absolute: 0.2 10*3/uL (ref 0.0–0.7)
Eosinophils Relative: 4 %
HCT: 38.6 % (ref 36.0–46.0)
Hemoglobin: 12.8 g/dL (ref 12.0–15.0)
Lymphocytes Relative: 10 %
Lymphs Abs: 0.5 10*3/uL — ABNORMAL LOW (ref 0.7–4.0)
MCH: 30 pg (ref 26.0–34.0)
MCHC: 33.2 g/dL (ref 30.0–36.0)
MCV: 90.6 fL (ref 78.0–100.0)
Monocytes Absolute: 0.5 10*3/uL (ref 0.1–1.0)
Monocytes Relative: 10 %
Neutro Abs: 4 10*3/uL (ref 1.7–7.7)
Neutrophils Relative %: 76 %
Platelets: 342 10*3/uL (ref 150–400)
RBC: 4.26 MIL/uL (ref 3.87–5.11)
RDW: 17.1 % — ABNORMAL HIGH (ref 11.5–15.5)
WBC: 5.2 10*3/uL (ref 4.0–10.5)

## 2017-04-18 LAB — BASIC METABOLIC PANEL
Anion gap: 7 (ref 5–15)
BUN: 12 mg/dL (ref 6–20)
CO2: 30 mmol/L (ref 22–32)
Calcium: 9.3 mg/dL (ref 8.9–10.3)
Chloride: 97 mmol/L — ABNORMAL LOW (ref 101–111)
Creatinine, Ser: 0.82 mg/dL (ref 0.44–1.00)
GFR calc Af Amer: >60 mL/min (ref >60)
GFR calc non Af Amer: >60 mL/min (ref >60)
Glucose, Bld: 81 mg/dL (ref 65–99)
Potassium: 4.4 mmol/L (ref 3.5–5.1)
Sodium: 134 mmol/L — ABNORMAL LOW (ref 135–145)

## 2017-09-02 ENCOUNTER — Encounter (INDEPENDENT_AMBULATORY_CARE_PROVIDER_SITE_OTHER): Payer: Self-pay | Admitting: Internal Medicine

## 2017-09-17 ENCOUNTER — Ambulatory Visit (INDEPENDENT_AMBULATORY_CARE_PROVIDER_SITE_OTHER): Payer: Medicare HMO | Admitting: Internal Medicine

## 2017-10-07 ENCOUNTER — Ambulatory Visit: Payer: Medicare HMO | Admitting: Orthopedic Surgery

## 2017-10-07 ENCOUNTER — Encounter: Payer: Self-pay | Admitting: Orthopedic Surgery

## 2017-10-07 ENCOUNTER — Ambulatory Visit (INDEPENDENT_AMBULATORY_CARE_PROVIDER_SITE_OTHER): Payer: Medicare HMO

## 2017-10-07 VITALS — BP 138/79 | HR 101 | Ht 68.0 in | Wt 92.0 lb

## 2017-10-07 DIAGNOSIS — M545 Low back pain, unspecified: Secondary | ICD-10-CM

## 2017-10-07 DIAGNOSIS — S32010A Wedge compression fracture of first lumbar vertebra, initial encounter for closed fracture: Secondary | ICD-10-CM

## 2017-10-07 MED ORDER — TRAMADOL HCL 50 MG PO TABS: 50.0000 mg | ORAL_TABLET | Freq: Four times a day (QID) | ORAL | 5 refills | Status: DC | PRN

## 2017-10-07 MED ORDER — SENNOSIDES-DOCUSATE SODIUM 8.6-50 MG PO TABS: 1.0000 | ORAL_TABLET | Freq: Every day | ORAL | 1 refills | Status: DC | PRN

## 2017-10-07 NOTE — Progress Notes (Deleted)
g lum

## 2017-10-07 NOTE — Progress Notes (Signed)
Progress Note   Patient ID: Taylor Cannon, female   DOB: 27-May-1934, 84 y.o.   MRN: [id number]  Chief Complaint  Patient presents with  . Back Pain    lumbar x2-3 weeks     84 year old female presents with new onset back pain approximately 8 weeks ago no trauma  She complains of severe lumbar spine pain worse in the last 3 weeks, pain is partially relieved with Motrin.  She reports the pain is severe confined to the lower back, intermittently sharp but primarily dull  Prior treatment includes Aleve times 4 weeks no improvement Motrin partially relieves her pain as does Ultram which she takes intermittently when in severe pain, she had physical therapy ordered but has not started she did go to chiropractor several visits without improvement     ROS   Review of systems she does report loss of weight over the last 2 years of feeling of fatigue but no GI GU symptoms and no numbness or tingling no radicular symptoms  Current Meds  Medication Sig  . ALPRAZolam (XANAX) 0.25 MG tablet Take 1 tablet (0.25 mg total) by mouth 2 (two) times daily as needed for anxiety. (Patient taking differently: Take 0.125-0.25 mg by mouth 2 (two) times daily as needed for anxiety or sleep. )  . amitriptyline (ELAVIL) 50 MG tablet Take 50 mg by mouth at bedtime.  . calcium carbonate (OSCAL) 1500 (600 Ca) MG TABS tablet Take 600 mg of elemental calcium by mouth daily.  Marland Kitchen omeprazole (PRILOSEC) 20 MG capsule Take 1 capsule (20 mg total) by mouth daily.  . OXYGEN Inhale 2 L into the lungs at bedtime.  . vitamin C (ASCORBIC ACID) 500 MG tablet Take 500 mg by mouth daily.    Past Medical History:  Diagnosis Date  . Anxiety   . COPD (chronic obstructive pulmonary disease) ([place])   . Depression   . GERD (gastroesophageal reflux disease)   . On home oxygen therapy    2L via [place] at night  . TIA (transient ischemic attack)      Allergies  Allergen Reactions  . Other Nausea Only    Steroids  . Oxycodone  Nausea And Vomiting    BP 138/79   Pulse (!) 101   Ht 5\' 8"  (1.727 m)   Wt 92 lb (41.7 kg)   BMI 13.99 kg/m    Physical Exam  Constitutional: She is oriented to person, place, and time. She appears well-developed and well-nourished.  Musculoskeletal:       Lumbar back: She exhibits decreased range of motion, tenderness, bony tenderness, deformity, pain and spasm. She exhibits no swelling and no edema.       Legs: Neurological: She is alert and oriented to person, place, and time.  Psychiatric: She has a normal mood and affect. Judgment normal.  Vitals reviewed.   Ortho Exam   Medical decision-making  Imaging: Today's x-ray report is in the chart please see the detailed report but she basically has a new L1 compression fracture as well as degenerative scoliosis and lumbar spondylosis  Encounter Diagnoses  Name Primary?  . Lumbar pain   . Closed compression fracture of first lumbar vertebra, initial encounter ([place]) Yes      Meds ordered this encounter  Medications  . traMADol (ULTRAM) 50 MG tablet    Sig: Take 1 tablet (50 mg total) by mouth every 6 (six) hours as needed.    Dispense:  60 tablet    Refill:  5  .  senna-docusate (SENOKOT-S) 8.6-50 MG tablet    Sig: Take 1 tablet by mouth daily as needed for mild constipation.    Dispense:  30 tablet    Refill:  1   Recommend MRI lumbar spine Senokot to control constipation [place] patient probably has new compression fracture  Recommend follow-up after the MRI no therapy until the MRI scan has been completed  Arther Abbott, MD 10/07/2017 9:31 AM

## 2017-10-08 ENCOUNTER — Telehealth: Payer: Self-pay | Admitting: Radiology

## 2017-10-08 NOTE — Telephone Encounter (Signed)
Left message for patient to call back, MRI sch for Monday Feb 11th arrive at 1245 for 1pm scan, I also need to make her a follow up appointment with Dr Aline Brochure to review the study.

## 2017-10-14 ENCOUNTER — Ambulatory Visit (HOSPITAL_COMMUNITY)
Admission: RE | Admit: 2017-10-14 | Discharge: 2017-10-14 | Disposition: A | Discharge status: Home / Self Care | Payer: Medicare HMO | Source: Ambulatory Visit | Attending: Orthopedic Surgery | Admitting: Orthopedic Surgery

## 2017-10-14 DIAGNOSIS — M545 Low back pain, unspecified: Secondary | ICD-10-CM

## 2017-10-14 DIAGNOSIS — M5136 Other intervertebral disc degeneration, lumbar region: Secondary | ICD-10-CM | POA: Insufficient documentation

## 2017-10-14 DIAGNOSIS — R29890 Loss of height: Secondary | ICD-10-CM | POA: Diagnosis not present

## 2017-10-14 DIAGNOSIS — M4856XA Collapsed vertebra, not elsewhere classified, lumbar region, initial encounter for fracture: Secondary | ICD-10-CM | POA: Diagnosis not present

## 2017-10-21 ENCOUNTER — Ambulatory Visit: Payer: Medicare HMO | Admitting: Orthopedic Surgery

## 2017-10-21 ENCOUNTER — Encounter: Payer: Self-pay | Admitting: Orthopedic Surgery

## 2017-10-21 VITALS — BP 146/87 | HR 112 | Ht 68.0 in | Wt 92.0 lb

## 2017-10-21 DIAGNOSIS — M545 Low back pain, unspecified: Secondary | ICD-10-CM

## 2017-10-21 DIAGNOSIS — S32010G Wedge compression fracture of first lumbar vertebra, subsequent encounter for fracture with delayed healing: Secondary | ICD-10-CM | POA: Diagnosis not present

## 2017-10-21 NOTE — Patient Instructions (Addendum)
We will order in-home therapy for your lower back pain and order epidural injections at L4-L5 Epidural Steroid Injection An epidural steroid injection is a shot of steroid medicine and numbing medicine that is given into the space between the spinal cord and the bones in your back (epidural space). The shot helps relieve pain caused by an irritated or swollen nerve root. The amount of pain relief you get from the injection depends on what is causing the nerve to be swollen and irritated, and how long your pain lasts. You are more likely to benefit from this injection if your pain is strong and comes on suddenly rather than if you have had pain for a long time. Tell a health care provider about:  Any allergies you have.  All medicines you are taking, including vitamins, herbs, eye drops, creams, and over-the-counter medicines.  Any problems you or family members have had with anesthetic medicines.  Any blood disorders you have.  Any surgeries you have had.  Any medical conditions you have.  Whether you are pregnant or may be pregnant. What are the risks? Generally, this is a safe procedure. However, problems may occur, including:  Headache.  Bleeding.  Infection.  Allergic reaction to medicines.  Damage to your nerves.  What happens before the procedure? Staying hydrated Follow instructions from your health care provider about hydration, which may include:  Up to 2 hours before the procedure - you may continue to drink clear liquids, such as water, clear fruit juice, black coffee, and plain tea.  Eating and drinking restrictions Follow instructions from your health care provider about eating and drinking, which may include:  8 hours before the procedure - stop eating heavy meals or foods such as meat, fried foods, or fatty foods.  6 hours before the procedure - stop eating light meals or foods, such as toast or cereal.  6 hours before the procedure - stop drinking milk or  drinks that contain milk.  2 hours before the procedure - stop drinking clear liquids.  Medicine  You may be given medicines to lower anxiety.  Ask your health care provider about: ? Changing or stopping your regular medicines. This is especially important if you are taking diabetes medicines or blood thinners. ? Taking medicines such as aspirin and ibuprofen. These medicines can thin your blood. Do not take these medicines before your procedure if your health care provider instructs you not to. General instructions  Plan to have someone take you home from the hospital or clinic. What happens during the procedure?  You may receive a medicine to help you relax (sedative).  You will be asked to lie on your abdomen.  The injection site will be cleaned.  A numbing medicine (local anesthetic) will be used to numb the injection site.  A needle will be inserted through your skin into the epidural space. You may feel some discomfort when this happens. An X-ray machine will be used to make sure the needle is put as close as possible to the affected nerve.  A steroid medicine and a local anesthetic will be injected into the epidural space.  The needle will be removed.  A bandage (dressing) will be put over the injection site. What happens after the procedure?  Your blood pressure, heart rate, breathing rate, and blood oxygen level will be monitored until the medicines you were given have worn off.  Your arm or leg may feel weak or numb for a few hours.  The injection site  may feel sore.  Do not drive for 24 hours if you received a sedative. This information is not intended to replace advice given to you by your health care provider. Make sure you discuss any questions you have with your health care provider. Document Released: 11/27/2007 Document Revised: 02/01/2016 Document Reviewed: 12/06/2015 Elsevier Interactive Patient Education  [employer].

## 2017-10-21 NOTE — Progress Notes (Signed)
This is a follow-up visit patient complains of lower back pain.  She is been on tramadol with no improvement in her symptoms still complains of lower back pain primarily on the right with some occasional radiation into the left side.  She said difficulty getting up and down.  As far as her review of systems goes she has not had any bladder or bladder dysfunction  Interpret image today MRI lumbar spine.  I personally reviewed the images and she has multilevel disc disease starting at L2 going all the way down to S1 with multiple levels of bulging disc and facet arthritis ligamentum hypertrophy which is worse at L4-5 and L5-S1  She also has a subacute L1 compression fracture 50% loss of height  Report is included in the medical record and has been reviewed  We will order epidural injections as well as physical therapy and follow-up in 6 weeks

## 2017-10-21 NOTE — Addendum Note (Signed)
Addended byCandice Camp on: 10/21/2017 09:13 AM   Modules accepted: Orders

## 2017-10-22 ENCOUNTER — Telehealth: Payer: Self-pay | Admitting: Radiology

## 2017-10-22 ENCOUNTER — Other Ambulatory Visit: Payer: Self-pay | Admitting: Orthopedic Surgery

## 2017-10-22 DIAGNOSIS — M545 Low back pain, unspecified: Secondary | ICD-10-CM

## 2017-10-22 DIAGNOSIS — S32010D Wedge compression fracture of first lumbar vertebra, subsequent encounter for fracture with routine healing: Secondary | ICD-10-CM

## 2017-10-22 NOTE — Telephone Encounter (Signed)
Yes remember we went through this yesterday

## 2017-10-22 NOTE — Telephone Encounter (Signed)
No, I did not recall, have printed order will fax to [facility] when it is signed.

## 2017-10-22 NOTE — Telephone Encounter (Signed)
Physical therapy called to schedule, daughter advised them Taylor Cannon is home bound, ok to send to home health for physical therapy?

## 2017-10-28 ENCOUNTER — Inpatient Hospital Stay
Admission: RE | Admit: 2017-10-28 | Discharge: 2017-10-28 | Disposition: A | Discharge status: Home / Self Care | Payer: Medicare HMO | Source: Ambulatory Visit | Attending: Orthopedic Surgery | Admitting: Orthopedic Surgery

## 2017-10-28 NOTE — Discharge Instructions (Signed)

## 2017-10-30 ENCOUNTER — Ambulatory Visit
Admission: RE | Admit: 2017-10-30 | Discharge: 2017-10-30 | Disposition: A | Discharge status: Home / Self Care | Payer: Medicare HMO | Source: Ambulatory Visit | Attending: Orthopedic Surgery | Admitting: Orthopedic Surgery

## 2017-10-30 DIAGNOSIS — M545 Low back pain, unspecified: Secondary | ICD-10-CM

## 2017-10-30 MED ORDER — IOPAMIDOL (ISOVUE-M 200) INJECTION 41%
1.0000 mL | Freq: Once | INTRAMUSCULAR | Status: AC
  Administered 2017-10-30: 1 mL via EPIDURAL

## 2017-10-30 MED ORDER — METHYLPREDNISOLONE ACETATE 40 MG/ML INJ SUSP (RADIOLOG
120.0000 mg | Freq: Once | INTRAMUSCULAR | Status: AC
  Administered 2017-10-30: 120 mg via EPIDURAL

## 2017-10-30 NOTE — Discharge Instructions (Signed)

## 2017-12-02 ENCOUNTER — Encounter: Payer: Self-pay | Admitting: Orthopedic Surgery

## 2017-12-02 ENCOUNTER — Ambulatory Visit: Payer: Medicare HMO | Admitting: Orthopedic Surgery

## 2017-12-02 VITALS — BP 137/84 | HR 102 | Ht 68.0 in | Wt 90.0 lb

## 2017-12-02 DIAGNOSIS — M545 Low back pain, unspecified: Secondary | ICD-10-CM

## 2017-12-02 NOTE — Progress Notes (Signed)
Progress Note   Patient ID: Taylor Cannon, female   DOB: Jan 11, 1934, 84 y.o.   MRN: [id number]  Chief Complaint  Patient presents with  . Back Pain    Follow up after ESI injections. Feels better     84 year old female got epidural injections for excruciating back pain got significant relief  We discussed possibility of a more supportive lumbar brace but declined after discussion.    ROS Current Meds  Medication Sig  . ALPRAZolam (XANAX) 0.25 MG tablet Take 1 tablet (0.25 mg total) by mouth 2 (two) times daily as needed for anxiety. (Patient taking differently: Take 0.125-0.25 mg by mouth 2 (two) times daily as needed for anxiety or sleep. )  . amitriptyline (ELAVIL) 50 MG tablet Take 50 mg by mouth at bedtime.  . calcium carbonate (OSCAL) 1500 (600 Ca) MG TABS tablet Take 600 mg of elemental calcium by mouth daily.  Marland Kitchen omeprazole (PRILOSEC) 20 MG capsule Take 1 capsule (20 mg total) by mouth daily.  . OXYGEN Inhale 2 L into the lungs at bedtime.  . senna-docusate (SENOKOT-S) 8.6-50 MG tablet Take 1 tablet by mouth daily as needed for mild constipation.  . vitamin C (ASCORBIC ACID) 500 MG tablet Take 500 mg by mouth daily.    Allergies  Allergen Reactions  . Other Nausea Only    Steroids  . Oxycodone Nausea And Vomiting     BP 137/84   Pulse (!) 102   Ht 5\' 8"  (1.727 m)   Wt 90 lb (40.8 kg)   BMI 13.68 kg/m   Physical Exam  Constitutional: She is oriented to person, place, and time. She appears well-developed and well-nourished.  Neurological: She is alert and oriented to person, place, and time. Gait abnormal.  Currently ambulatory with a walker doing that fairly well.  Psychiatric: She has a normal mood and affect. Judgment normal.  Vitals reviewed. Back pain has improved   Medical decision-making Encounter Diagnosis  Name Primary?  . Lumbar pain Yes    Pain improved patient can get another injection any time she can call or [place] or call the epidural center for  another injection follow-up as needed     Arther Abbott, MD 12/02/2017 9:41 AM

## 2018-04-25 ENCOUNTER — Encounter: Payer: Self-pay | Admitting: Family Medicine

## 2018-05-23 ENCOUNTER — Telehealth: Payer: Self-pay | Admitting: Orthopedic Surgery

## 2018-05-23 NOTE — Telephone Encounter (Signed)
If she is unable to bear weight ER please, will you call her, if you can, Thanks

## 2018-05-23 NOTE — Telephone Encounter (Signed)
Patient/daughter, designated contact [employer], called with question about patient's left hip - had surgery by Dr Aline Brochure 09/18/15. Patient was last seen in April for lumbar spine, which she said "is fine"; however, states "a lump has come up on the left hip" in area of surgical site; said she is hurting, and also unable to bear weight.  Please advise if Emergency room or ot her recommendation. Ph# [phone]

## 2018-05-23 NOTE — Telephone Encounter (Signed)
Called back to patient and relayed. Patient does not seem to want to go to ER - encouraged.  Patient's aid is encouraging as well and offering to take her there.

## 2018-05-25 ENCOUNTER — Encounter (HOSPITAL_COMMUNITY): Payer: Self-pay | Admitting: Emergency Medicine

## 2018-05-25 ENCOUNTER — Emergency Department (HOSPITAL_COMMUNITY): Payer: Medicare HMO

## 2018-05-25 ENCOUNTER — Emergency Department (HOSPITAL_COMMUNITY)
Admission: EM | Admit: 2018-05-25 | Discharge: 2018-05-25 | Disposition: A | Discharge status: Home / Self Care | Payer: Medicare HMO | Attending: Emergency Medicine | Admitting: Emergency Medicine

## 2018-05-25 ENCOUNTER — Other Ambulatory Visit: Payer: Self-pay

## 2018-05-25 DIAGNOSIS — Z79899 Other long term (current) drug therapy: Secondary | ICD-10-CM | POA: Diagnosis not present

## 2018-05-25 DIAGNOSIS — J449 Chronic obstructive pulmonary disease, unspecified: Secondary | ICD-10-CM | POA: Diagnosis not present

## 2018-05-25 DIAGNOSIS — M25552 Pain in left hip: Secondary | ICD-10-CM | POA: Insufficient documentation

## 2018-05-25 DIAGNOSIS — F17210 Nicotine dependence, cigarettes, uncomplicated: Secondary | ICD-10-CM | POA: Diagnosis not present

## 2018-05-25 NOTE — Discharge Instructions (Addendum)
You were evaluated in the emergency department for left hip pain.  You had x-rays that did not show any signs of fracture and your hardware was in good position.  This is possibly just musculoskeletal pain and you should try Naprosyn and use heat or ice to the area.  Continue to use your cane or walker.  Please contact your orthopedic doctor or your primary care doctor for follow-up.  Return if any worsening symptoms.

## 2018-05-25 NOTE — ED Triage Notes (Signed)
Pt reports L hip pain with no known injury since Thursday. Had previous hip surgery in 2015. No OTC medication for pain.

## 2018-05-25 NOTE — ED Provider Notes (Signed)
[facility] EMERGENCY DEPARTMENT Provider Note   CSN: [id number] Arrival date & time: 05/25/18  1115     History   Chief Complaint Chief Complaint  Patient presents with  . Hip Pain    left    HPI Taylor Cannon is a 84 y.o. female.  She has a history of left hip surgery for fractures done approximately 2015.  She noted on Thursday that she had new pain when she put weight on her leg.  She woke up with it, no trauma noted.  She is continued to have pain and has not tried anything for it or try to reach her orthopedic doctor.  No fevers no chills no abdominal pain no numbness no weakness.  No urinary symptoms.  The history is provided by the patient and a relative.  Hip Pain  This is a new problem. Episode onset: 4 days. The problem has not changed since onset.Pertinent negatives include no chest pain, no abdominal pain, no headaches and no shortness of breath. The symptoms are aggravated by walking and standing. The symptoms are relieved by rest. She has tried rest for the symptoms. The treatment provided no relief.    Past Medical History:  Diagnosis Date  . Anxiety   . COPD (chronic obstructive pulmonary disease) ([place])   . Depression   . GERD (gastroesophageal reflux disease)   . On home oxygen therapy    2L via [place] at night  . TIA (transient ischemic attack)     Patient Active Problem List   Diagnosis Date Noted  . Fall at home, initial encounter 02/28/2017  . Hematoma 02/28/2017  . Esophageal stricture 04/11/2016  . IDA (iron deficiency anemia) 04/11/2016  . Intertrochanteric fracture of left femur ([place]) 09/18/2015  . Closed fracture of intertrochanteric section of femur ([place])   . Protein-calorie malnutrition, severe 09/17/2015  . Closed left hip fracture ([place]) 09/16/2015  . COPD (chronic obstructive pulmonary disease) ([place]) 09/16/2015  . Tobacco abuse 09/16/2015  . GERD (gastroesophageal reflux disease) 09/16/2015  . Fall 09/16/2015  . Dysphagia,  pharyngoesophageal phase 10/05/2014  . Benign paroxysmal positional vertigo 10/05/2014    Past Surgical History:  Procedure Laterality Date  . APPENDECTOMY    . BALLOON DILATION N/A 07/24/2013   Procedure: BALLOON DILATION to 13.45mm;  Surgeon: Rogene Houston, MD;  Location: [facility];  Service: Endoscopy;  Laterality: N/A;  . BALLOON DILATION N/A 11/11/2014   Procedure: BALLOON DILATION;  Surgeon: Rogene Houston, MD;  Location: [facility];  Service: Endoscopy;  Laterality: N/A;  . CARDIAC ELECTROPHYSIOLOGY STUDY AND ABLATION    . CATARACT EXTRACTION Bilateral   . CERVICAL CONE BIOPSY    . ESOPHAGEAL DILATION N/A 11/30/2014   Procedure: ESOPHAGEAL DILATION;  Surgeon: Rogene Houston, MD;  Location: [facility];  Service: Endoscopy;  Laterality: N/A;  Balloon, 15, 16.5  . ESOPHAGEAL DILATION N/A 06/03/2015   Procedure: ESOPHAGEAL DILATION WITH 16.5 MM BALLOON ;  Surgeon: Rogene Houston, MD;  Location: [facility];  Service: Endoscopy;  Laterality: N/A;  . ESOPHAGEAL DILATION N/A 09/09/2015   Procedure: ESOPHAGEAL DILATION;  Surgeon: Rogene Houston, MD;  [facility];  Service: Endoscopy;  Laterality: N/A;  . ESOPHAGEAL DILATION N/A 05/04/2016   Procedure: ESOPHAGEAL DILATION;  Surgeon: Rogene Houston, MD;  [facility];  Service: Endoscopy;  Laterality: N/A;  . ESOPHAGEAL DILATION N/A 06/22/2016   Procedure: ESOPHAGEAL DILATION;  Surgeon: Rogene Houston, MD;  [facility];  Service: Endoscopy;  Laterality: N/A;  . ESOPHAGOGASTRODUODENOSCOPY N/A 11/11/2014   Procedure: ESOPHAGOGASTRODUODENOSCOPY (EGD);  Surgeon: Rogene Houston, MD;  [facility];  Service: Endoscopy;  Laterality: N/A;  125-rescheduled 11/11/14 @ 10:30am Ann notified pt  . ESOPHAGOGASTRODUODENOSCOPY (EGD) WITH ESOPHAGEAL DILATION N/A 08/20/2013   Procedure: ESOPHAGOGASTRODUODENOSCOPY (EGD) WITH ESOPHAGEAL DILATION;  Surgeon: Rogene Houston, MD;  [facility];  Service: Endoscopy;   Laterality: N/A;  345-moved to 730 Ann notified pt  . ESOPHAGOGASTRODUODENOSCOPY (EGD) WITH PROPOFOL N/A 07/24/2013   Procedure: ESOPHAGOGASTRODUODENOSCOPY (EGD) WITH PROPOFOL UNDER FLUOROSCOPY;  Surgeon: Rogene Houston, MD;  Location: [facility];  Service: Endoscopy;  Laterality: N/A;  . ESOPHAGOGASTRODUODENOSCOPY (EGD) WITH PROPOFOL N/A 11/30/2014   Procedure: ESOPHAGOGASTRODUODENOSCOPY (EGD) WITH PROPOFOL;  Surgeon: Rogene Houston, MD;  Location: [facility];  Service: Endoscopy;  Laterality: N/A;  fluoro not needed  . ESOPHAGOGASTRODUODENOSCOPY (EGD) WITH PROPOFOL N/A 06/03/2015   Procedure: ESOPHAGOGASTRODUODENOSCOPY (EGD) WITH PROPOFOL; HIATUS AT 35 CM AND GE JUNCTION 40 CM;  Surgeon: Rogene Houston, MD;  Location: [facility];  Service: Endoscopy;  Laterality: N/A;  . ESOPHAGOGASTRODUODENOSCOPY (EGD) WITH PROPOFOL N/A 09/09/2015   Procedure: ESOPHAGOGASTRODUODENOSCOPY (EGD) WITH PROPOFOL;  Surgeon: Rogene Houston, MD;  [facility];  Service: Endoscopy;  Laterality: N/A;  8:15  . ESOPHAGOGASTRODUODENOSCOPY (EGD) WITH PROPOFOL N/A 05/04/2016   Procedure: ESOPHAGOGASTRODUODENOSCOPY (EGD) WITH PROPOFOL;  Surgeon: Rogene Houston, MD;  [facility];  Service: Endoscopy;  Laterality: N/A;  9:30  . ESOPHAGOGASTRODUODENOSCOPY (EGD) WITH PROPOFOL N/A 06/22/2016   Procedure: ESOPHAGOGASTRODUODENOSCOPY (EGD) WITH PROPOFOL;  Surgeon: Rogene Houston, MD;  [facility];  Service: Endoscopy;  Laterality: N/A;  11:20  . INTRAMEDULLARY (IM) NAIL INTERTROCHANTERIC Left 09/18/2015   Procedure: INTRAMEDULLARY (IM) NAIL INTERTROCHANTRIC;  Surgeon: Carole Civil, MD;  Location: [facility];  Service: Orthopedics;  Laterality: Left;  . TUBAL LIGATION       OB History   None      Home Medications    Prior to Admission medications   Medication Sig Start Date End Date Taking? Authorizing Provider  ALPRAZolam (XANAX) 0.25 MG tablet Take 1 tablet (0.25 mg total) by mouth 2 (two) times daily  as needed for anxiety. Patient taking differently: Take 0.125-0.25 mg by mouth 2 (two) times daily as needed for anxiety or sleep.  09/20/15   Kathie Dike, MD  amitriptyline (ELAVIL) 50 MG tablet Take 50 mg by mouth at bedtime. 10/02/17   [provider]  calcium carbonate (OSCAL) 1500 (600 Ca) MG TABS tablet Take 600 mg of elemental calcium by mouth daily.    [provider]  omeprazole (PRILOSEC) 20 MG capsule Take 1 capsule (20 mg total) by mouth daily. 09/25/16   RehmanMechele Dawley, MD  OXYGEN Inhale 2 L into the lungs at bedtime.    [provider]  senna-docusate (SENOKOT-S) 8.6-50 MG tablet Take 1 tablet by mouth daily as needed for mild constipation. 10/07/17   Carole Civil, MD  traMADol (ULTRAM) 50 MG tablet Take 1 tablet (50 mg total) by mouth every 6 (six) hours as needed. Patient not taking: Reported on 12/02/2017 10/07/17   Carole Civil, MD  vitamin C (ASCORBIC ACID) 500 MG tablet Take 500 mg by mouth daily.    [provider]    Family History History reviewed. No pertinent family history.  Social History Social History   Tobacco Use  . Smoking status: Current Every Day Smoker  Packs/day: 1.00    Years: 55.00    Pack years: 55.00    Types: Cigarettes  . Smokeless tobacco: Never Used  . Tobacco comment: quit smoking since 1st of July.  Smoked all her life.   Substance Use Topics  . Alcohol use: Yes    Alcohol/week: 14.0 standard drinks    Types: 14 Shots of liquor per week    Comment: occasoinally  . Drug use: No     Allergies   Other and Oxycodone   Review of Systems Review of Systems  Constitutional: Negative for fever.  HENT: Negative for sore throat.   Eyes: Negative for visual disturbance.  Respiratory: Negative for shortness of breath.   Cardiovascular: Negative for chest pain.  Gastrointestinal: Negative for abdominal pain.  Genitourinary: Negative for dysuria.  Musculoskeletal: Negative for back pain.    Skin: Negative for rash.  Neurological: Negative for headaches.     Physical Exam Updated Vital Signs BP (!) 149/84 (BP Location: Right Arm)   Pulse (!) 110   Temp 98.8 F (37.1 C) (Oral)   Resp 20   Ht 5\' 8"  (1.727 m)   Wt 41.3 kg   SpO2 96%   BMI 13.84 kg/m   Physical Exam  Constitutional: She appears well-developed and well-nourished.  HENT:  Head: Normocephalic and atraumatic.  Eyes: Conjunctivae are normal.  Neck: Neck supple.  Cardiovascular: Normal rate, regular rhythm and normal heart sounds.  Pulmonary/Chest: Effort normal. No stridor. She has no wheezes.  Abdominal: Soft. She exhibits no mass. There is no tenderness. There is no guarding.  Musculoskeletal: Normal range of motion. She exhibits tenderness. She exhibits no edema or deformity.  Left hip no overlying erythema.  She is got a well-healed surgical scar on the lateral aspect.  She got normal flexion extension internal and external rotation without any pain.  She is got some tenderness posterior to the hip and the gluteus.  Distal neurovascular intact cap refill brisk.  Neurological: She is alert. GCS eye subscore is 4. GCS verbal subscore is 5. GCS motor subscore is 6.  Skin: Skin is warm and dry. Capillary refill takes less than 2 seconds.  Psychiatric: She has a normal mood and affect.  Nursing note and vitals reviewed.    ED Treatments / Results  Labs (all labs ordered are listed, but only abnormal results are displayed) Labs Reviewed - No data to display  EKG None  Radiology Dg Hip Unilat With Pelvis 2-3 Views Left  Result Date: 05/25/2018 CLINICAL DATA:  Left hip pain. No known injury. History of left hip surgery in 2015. EXAM: DG HIP (WITH OR WITHOUT PELVIS) 2-3V LEFT COMPARISON:  Radiographs 02/28/2017. FINDINGS: The bones are diffusely demineralized. Patient is status post dynamic compression screw fixation of the left femoral neck. There is stable posttraumatic deformity of the  intertrochanteric region. No evidence of acute fracture, dislocation or hardware loosening. Mild degenerative changes of both hips are noted. IMPRESSION: Stable appearance of the proximal left femur post ORIF. No acute osseous findings. Electronically Signed   By: Richardean Sale M.D.   On: 05/25/2018 12:37    Procedures Procedures (including critical care time)  Medications Ordered in ED Medications - No data to display   Initial Impression / Assessment and Plan / ED Course  I have reviewed the triage vital signs and the nursing notes.  Pertinent labs & imaging results that were available during my care of the patient were reviewed by me and considered in my medical  decision making (see chart for details).  Clinical Course as of May 26 2135  Sun May 26, 2263  [id number] 84 year old female here with atraumatic left hip pain.  She is tried nothing for it.  On exam the hip feels stable and located.  X-rays show hardware intact and no obvious fractures.  I do not feel the patient needs a CAT scan as the hardware should be taking all of the weightbearing load.  Likely there is something muscular bothering the patient she only has pain with weightbearing.  I recommend to her that she start taking the Naprosyn that she has and using her cane and walker more regularly and follow-up with her doctor or orthopedist this week.  She is comfortable plan understands to return if any worsening symptoms.   [MB]    Clinical Course User Index [MB] Hayden Rasmussen, MD     Final Clinical Impressions(s) / ED Diagnoses   Final diagnoses:  Left hip pain    ED Discharge Orders    None       Hayden Rasmussen, MD 05/25/18 2136

## 2018-05-27 DIAGNOSIS — Z8781 Personal history of (healed) traumatic fracture: Secondary | ICD-10-CM

## 2018-05-27 DIAGNOSIS — Z9889 Other specified postprocedural states: Secondary | ICD-10-CM

## 2018-05-27 HISTORY — DX: Personal history of (healed) traumatic fracture: Z87.81

## 2018-05-27 HISTORY — DX: Other specified postprocedural states: Z98.890

## 2018-05-28 ENCOUNTER — Ambulatory Visit: Payer: Medicare HMO | Admitting: Orthopedic Surgery

## 2018-05-28 VITALS — BP 116/90 | HR 102 | Ht 68.0 in | Wt 89.0 lb

## 2018-05-28 DIAGNOSIS — Z8781 Personal history of (healed) traumatic fracture: Secondary | ICD-10-CM | POA: Diagnosis not present

## 2018-05-28 DIAGNOSIS — Z967 Presence of other bone and tendon implants: Secondary | ICD-10-CM

## 2018-05-28 DIAGNOSIS — M545 Low back pain, unspecified: Secondary | ICD-10-CM

## 2018-05-28 DIAGNOSIS — Z9889 Other specified postprocedural states: Secondary | ICD-10-CM

## 2018-05-28 NOTE — Patient Instructions (Signed)
Call [phone] to schedule the lumbar spine injection you will speak to [facility]

## 2018-05-28 NOTE — Progress Notes (Signed)
Progress Note   Patient ID: Taylor Cannon, female   DOB: Apr 24, 1934, 84 y.o.   MRN: [id number]   Chief Complaint  Patient presents with  . Hip Pain    ER follow up on left hip pain, no injury.    HPI The patient presents for evaluation of eft hip pain.  The patient had a left inotrope fracture treated with gamma nail when he 19 recovered well.  Presented to the ER on the 22nd of this month complaining of left hip pain.  She has a history of extensive lumbar degenerative disc disease treated previously with epidural injection x1 with good result  Location lower back pain radiates to left hip Duration 3 weeks Quality Sharp sometimes dull ache left hip and leg and lower back Severity moderate Associated with painful weightbearing  Review of Systems  Constitutional: Negative for chills, fever, malaise/fatigue and weight loss.  Gastrointestinal: Negative for constipation.       Denies loss bowel control   Genitourinary:       Denies urinary retention or los of bladder control    No outpatient medications have been marked as taking for the 05/28/18 encounter (Office Visit) with Carole Civil, MD.    Past Medical History:  Diagnosis Date  . Anxiety   . COPD (chronic obstructive pulmonary disease) ([place])   . Depression   . GERD (gastroesophageal reflux disease)   . On home oxygen therapy    2L via [place] at night  . TIA (transient ischemic attack)    Past Surgical History:  Procedure Laterality Date  . APPENDECTOMY    . BALLOON DILATION N/A 07/24/2013   Procedure: BALLOON DILATION to 13.27mm;  Surgeon: Rogene Houston, MD;  Location: [facility];  Service: Endoscopy;  Laterality: N/A;  . BALLOON DILATION N/A 11/11/2014   Procedure: BALLOON DILATION;  Surgeon: Rogene Houston, MD;  [facility];  Service: Endoscopy;  Laterality: N/A;  . CARDIAC ELECTROPHYSIOLOGY STUDY AND ABLATION    . CATARACT EXTRACTION Bilateral   . CERVICAL CONE BIOPSY    . ESOPHAGEAL DILATION N/A  11/30/2014   Procedure: ESOPHAGEAL DILATION;  Surgeon: Rogene Houston, MD;  Location: [facility];  Service: Endoscopy;  Laterality: N/A;  Balloon, 15, 16.5  . ESOPHAGEAL DILATION N/A 06/03/2015   Procedure: ESOPHAGEAL DILATION WITH 16.5 MM BALLOON ;  Surgeon: Rogene Houston, MD;  Location: [facility];  Service: Endoscopy;  Laterality: N/A;  . ESOPHAGEAL DILATION N/A 09/09/2015   Procedure: ESOPHAGEAL DILATION;  Surgeon: Rogene Houston, MD;  [facility];  Service: Endoscopy;  Laterality: N/A;  . ESOPHAGEAL DILATION N/A 05/04/2016   Procedure: ESOPHAGEAL DILATION;  Surgeon: Rogene Houston, MD;  [facility];  Service: Endoscopy;  Laterality: N/A;  . ESOPHAGEAL DILATION N/A 06/22/2016   Procedure: ESOPHAGEAL DILATION;  Surgeon: Rogene Houston, MD;  [facility];  Service: Endoscopy;  Laterality: N/A;  . ESOPHAGOGASTRODUODENOSCOPY N/A 11/11/2014   Procedure: ESOPHAGOGASTRODUODENOSCOPY (EGD);  Surgeon: Rogene Houston, MD;  [facility];  Service: Endoscopy;  Laterality: N/A;  125-rescheduled 11/11/14 @ 10:30am Ann notified pt  . ESOPHAGOGASTRODUODENOSCOPY (EGD) WITH ESOPHAGEAL DILATION N/A 08/20/2013   Procedure: ESOPHAGOGASTRODUODENOSCOPY (EGD) WITH ESOPHAGEAL DILATION;  Surgeon: Rogene Houston, MD;  [facility];  Service: Endoscopy;  Laterality: N/A;  345-moved to 730 Ann notified pt  . ESOPHAGOGASTRODUODENOSCOPY (EGD) WITH PROPOFOL N/A 07/24/2013   Procedure: ESOPHAGOGASTRODUODENOSCOPY (EGD) WITH PROPOFOL UNDER FLUOROSCOPY;  Surgeon: Mechele Dawley  Laural Golden, MD;  Location: [facility];  Service: Endoscopy;  Laterality: N/A;  . ESOPHAGOGASTRODUODENOSCOPY (EGD) WITH PROPOFOL N/A 11/30/2014   Procedure: ESOPHAGOGASTRODUODENOSCOPY (EGD) WITH PROPOFOL;  Surgeon: Rogene Houston, MD;  Location: [facility];  Service: Endoscopy;  Laterality: N/A;  fluoro not needed  . ESOPHAGOGASTRODUODENOSCOPY (EGD) WITH PROPOFOL N/A 06/03/2015   Procedure: ESOPHAGOGASTRODUODENOSCOPY (EGD) WITH  PROPOFOL; HIATUS AT 35 CM AND GE JUNCTION 40 CM;  Surgeon: Rogene Houston, MD;  Location: [facility];  Service: Endoscopy;  Laterality: N/A;  . ESOPHAGOGASTRODUODENOSCOPY (EGD) WITH PROPOFOL N/A 09/09/2015   Procedure: ESOPHAGOGASTRODUODENOSCOPY (EGD) WITH PROPOFOL;  Surgeon: Rogene Houston, MD;  [facility];  Service: Endoscopy;  Laterality: N/A;  8:15  . ESOPHAGOGASTRODUODENOSCOPY (EGD) WITH PROPOFOL N/A 05/04/2016   Procedure: ESOPHAGOGASTRODUODENOSCOPY (EGD) WITH PROPOFOL;  Surgeon: Rogene Houston, MD;  [facility];  Service: Endoscopy;  Laterality: N/A;  9:30  . ESOPHAGOGASTRODUODENOSCOPY (EGD) WITH PROPOFOL N/A 06/22/2016   Procedure: ESOPHAGOGASTRODUODENOSCOPY (EGD) WITH PROPOFOL;  Surgeon: Rogene Houston, MD;  [facility];  Service: Endoscopy;  Laterality: N/A;  11:20  . INTRAMEDULLARY (IM) NAIL INTERTROCHANTERIC Left 09/18/2015   Procedure: INTRAMEDULLARY (IM) NAIL INTERTROCHANTRIC;  Surgeon: Carole Civil, MD;  Location: [facility];  Service: Orthopedics;  Laterality: Left;  . TUBAL LIGATION       Allergies  Allergen Reactions  . Other Nausea Only    Steroids  . Oxycodone Nausea And Vomiting     BP 116/90   Pulse (!) 102   Ht 5\' 8"  (1.727 m)   Wt 89 lb (40.4 kg)   BMI 13.53 kg/m    Physical Exam General appearance normal Oriented x3 normal Mood pleasant affect normal Gait supported by a cane with some limping favoring the left side  Ortho Exam Left hip and lower extremity Inspection and palpation revealed tenderness over the left greater trochanter without swelling range of motion is full No instability was detected on stress testing Muscle tone and strength was normal without tremor Skin was warm dry and intact Good pulse and temperature were noted in the extremity Sensation revealed no abnormalities to light touch  Tenderness lumbar spine lower back with decreased range of motion in all planes no evidence of increased muscle  tension skin normal   MEDICAL DECISION MAKING   Imaging: CLINICAL DATA:  Left hip pain. No known injury. History of left hip surgery in 2015.   EXAM: DG HIP (WITH OR WITHOUT PELVIS) 2-3V LEFT   COMPARISON:  Radiographs 02/28/2017.   FINDINGS: The bones are diffusely demineralized. Patient is status post dynamic compression screw fixation of the left femoral neck. There is stable posttraumatic deformity of the intertrochanteric region. No evidence of acute fracture, dislocation or hardware loosening. Mild degenerative changes of both hips are noted.   IMPRESSION: Stable appearance of the proximal left femur post ORIF. No acute osseous findings.     Electronically Signed   By: Richardean Sale M.D.   On: 05/25/2018 12:37     CLINICAL DATA:  Low back pain for 3 weeks.   EXAM: MRI LUMBAR SPINE WITHOUT CONTRAST   TECHNIQUE: Multiplanar, multisequence MR imaging of the lumbar spine was performed. No intravenous contrast was administered.   COMPARISON:  Lumbar spine radiographs 10/07/2017 and 02/21/2016   FINDINGS: Segmentation:  Standard.   Alignment: Mild dextroscoliosis with apex at L3. Trace retrolisthesis of L2 on L3.   Vertebrae: As seen on recent radiographs, there is an L1 compression fracture with approximately  50% height loss and diffuse vertebral body marrow edema mildly extending into the pedicles. Minimal retropulsion of the superior and inferior endplates measures 3 mm. No suspicious osseous lesion is identified.   Conus medullaris and cauda equina: Conus extends to the L1 level. Conus and cauda equina appear normal.   Paraspinal and other soft tissues: Mild paravertebral edema at L1. The spleen was incompletely imaged but appears enlarged. The liver may also be enlarged. Subcentimeter T2 hyperintense lesions in both kidneys likely represent cysts.   Disc levels:   Disc desiccation throughout the lumbar spine. Mild disc space narrowing at L2-3  with moderate narrowing at L5-S1.   T12-L1: Minimal L1 superior endplate retropulsion without stenosis.   L1-2: Minimal L1 inferior endplate retropulsion without stenosis.   L2-3: Mild disc bulging, superimposed left subarticular disc protrusion, and mild facet hypertrophy result in mild left lateral recess stenosis without spinal or neural foraminal stenosis.   L3-4: Mild disc bulging and mild-to-moderate facet and ligamentum flavum hypertrophy result in borderline to mild bilateral lateral recess stenosis without spinal or neural foraminal stenosis.   L4-5: Mild disc bulging greater to the left and mild-to-moderate facet and ligamentum flavum hypertrophy result in mild left and borderline right lateral recess stenosis without spinal or neural foraminal stenosis. There are small facet joint effusions with a 1 cm synovial cyst posterior to the left facet joint.   L5-S1: Mild circumferential disc bulging and mild facet and ligamentum flavum hypertrophy without stenosis.   IMPRESSION: 1. Acute/subacute L1 compression fracture with 50% vertebral body height loss. Minimal retropulsion without stenosis. 2. Mild-to-moderate lumbar disc and facet degeneration without high-grade stenosis.   Electronically Signed: By: Logan Bores M.D. On: 10/14/2017 13:51    Encounter Diagnoses  Name Primary?  . S/P ORIF (open reduction internal fixation) fracture left hip 09/18/15 IM nail    . Lumbar pain Yes     PLAN: (RX., injection, surgery,frx,mri/ct, XR 2 body ares) The patient's hip looks fine exam is normal but her back is acting up again and we sent her for epidural follow-up if no improvement  No orders of the defined types were placed in this encounter.  3:58 PM 05/28/2018

## 2018-05-29 ENCOUNTER — Other Ambulatory Visit: Payer: Self-pay | Admitting: Orthopedic Surgery

## 2018-05-29 DIAGNOSIS — M545 Low back pain: Principal | ICD-10-CM

## 2018-05-29 DIAGNOSIS — G8929 Other chronic pain: Secondary | ICD-10-CM

## 2018-06-13 ENCOUNTER — Other Ambulatory Visit: Payer: Medicare Other

## 2018-10-22 IMAGING — XA Imaging study
3 series · 3 of 3 positions shown · non-contrast
Comparison: none

CLINICAL DATA: Lumbosacral spondylosis without myelopathy. Chronic
bilateral low back pain. No radiculopathy.

[Series 5: ortho adipose · 1 of 1 slices shown (1 of 3)]
[im 1/1]
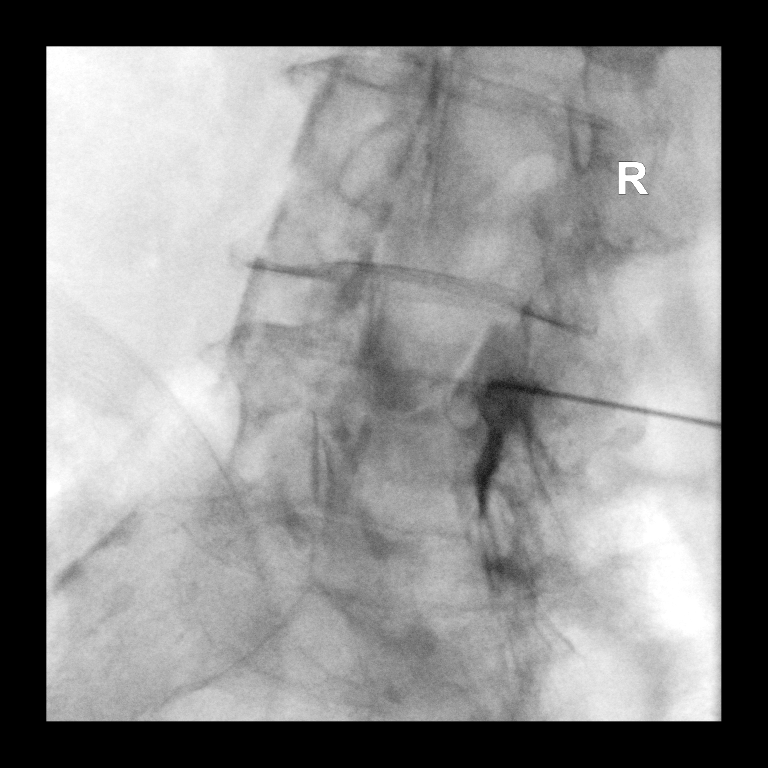

[Series 6: ortho adipose · 1 of 1 slices shown (2 of 3)]
[im 1/1]
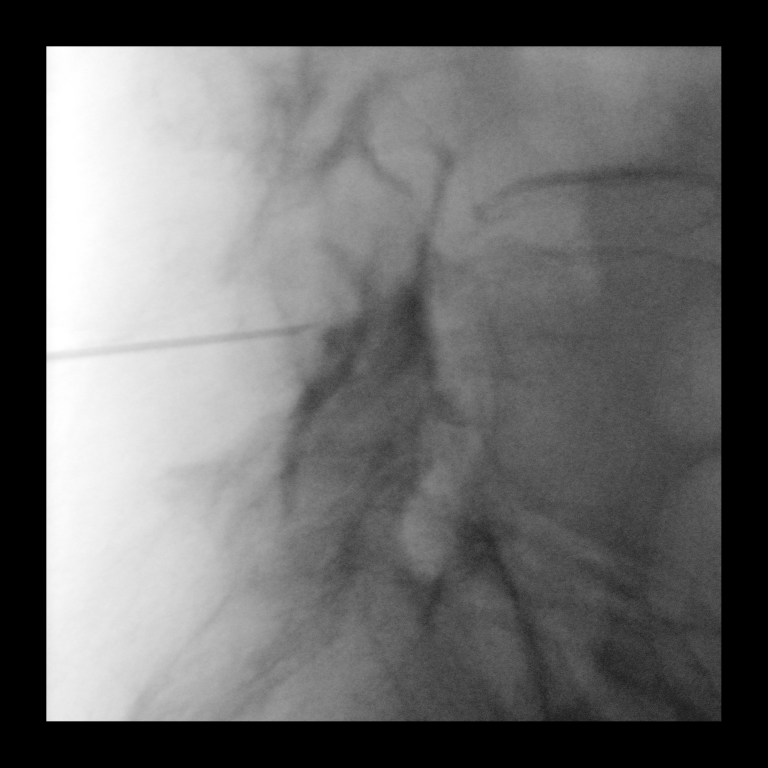

[Series 7: ortho adipose · 1 of 1 slices shown (3 of 3)]
[im 1/1]
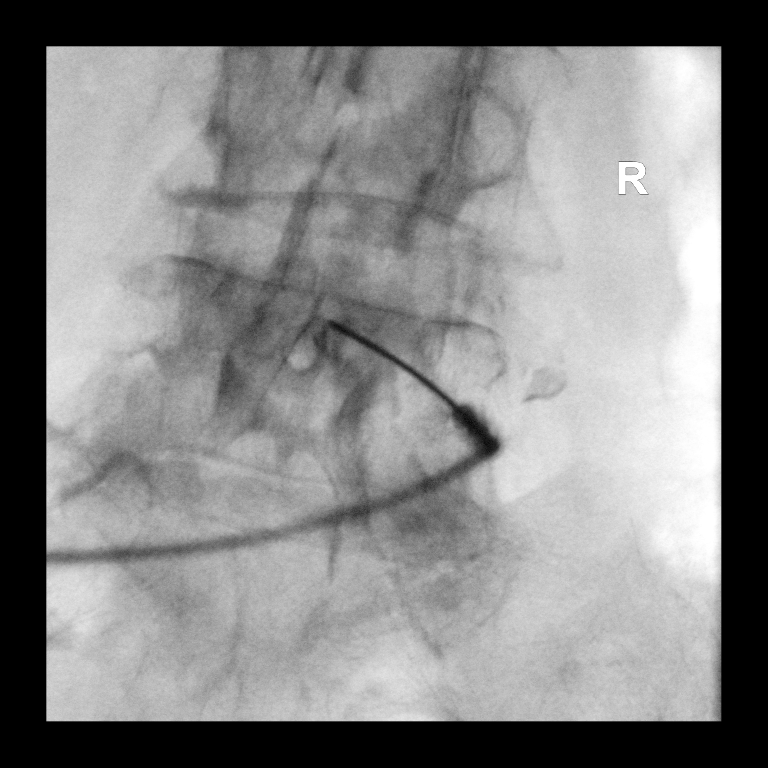

[3 of 3 positions shown; findings below may reference images not displayed]

FLUOROSCOPY TIME:  Radiation Exposure Index (as provided by the
fluoroscopic device): 62.28 Gy*m2

Fluoroscopy Time:  1 minutes, 24 seconds.

Number of Acquired Images:  0

PROCEDURE:
The procedure, risks, benefits, and alternatives were explained to
the patient. Questions regarding the procedure were encouraged and
answered. The patient understands and consents to the procedure.

LUMBAR EPIDURAL INJECTION:

An interlaminar approach was performed on right at L4-L5. The
overlying skin was cleansed and anesthetized. A 3.5 inch 20 gauge
epidural needle was advanced using loss-of-resistance technique.

DIAGNOSTIC EPIDURAL INJECTION:

Injection of Isovue-M 200 shows a good epidural pattern with spread
above and below the level of needle placement, primarily on the
right. No vascular opacification is seen.

THERAPEUTIC EPIDURAL INJECTION:

120 mg of Depo-Medrol mixed with 3 mL of 1% lidocaine were
instilled. The procedure was well-tolerated, and the patient was
discharged thirty minutes following the injection in good condition.

COMPLICATIONS:
None immediate.
IMPRESSION: Technically successful epidural injection on the right at L4-L5 #1.

## 2019-04-12 ENCOUNTER — Emergency Department (HOSPITAL_COMMUNITY): Payer: Medicare HMO

## 2019-04-12 ENCOUNTER — Other Ambulatory Visit: Payer: Self-pay

## 2019-04-12 ENCOUNTER — Emergency Department (HOSPITAL_COMMUNITY)
Admission: EM | Admit: 2019-04-12 | Discharge: 2019-04-12 | Disposition: A | Discharge status: Home / Self Care | Payer: Medicare HMO | Attending: Emergency Medicine | Admitting: Emergency Medicine

## 2019-04-12 ENCOUNTER — Encounter (HOSPITAL_COMMUNITY): Payer: Self-pay | Admitting: Emergency Medicine

## 2019-04-12 DIAGNOSIS — F17210 Nicotine dependence, cigarettes, uncomplicated: Secondary | ICD-10-CM | POA: Diagnosis not present

## 2019-04-12 DIAGNOSIS — M546 Pain in thoracic spine: Secondary | ICD-10-CM | POA: Insufficient documentation

## 2019-04-12 DIAGNOSIS — W19XXXA Unspecified fall, initial encounter: Secondary | ICD-10-CM

## 2019-04-12 DIAGNOSIS — M549 Dorsalgia, unspecified: Secondary | ICD-10-CM

## 2019-04-12 DIAGNOSIS — Z79899 Other long term (current) drug therapy: Secondary | ICD-10-CM | POA: Diagnosis not present

## 2019-04-12 DIAGNOSIS — Z8673 Personal history of transient ischemic attack (TIA), and cerebral infarction without residual deficits: Secondary | ICD-10-CM | POA: Diagnosis not present

## 2019-04-12 DIAGNOSIS — M545 Low back pain, unspecified: Secondary | ICD-10-CM

## 2019-04-12 DIAGNOSIS — W01190A Fall on same level from slipping, tripping and stumbling with subsequent striking against furniture, initial encounter: Secondary | ICD-10-CM | POA: Diagnosis not present

## 2019-04-12 DIAGNOSIS — J449 Chronic obstructive pulmonary disease, unspecified: Secondary | ICD-10-CM | POA: Insufficient documentation

## 2019-04-12 LAB — CBC WITH DIFFERENTIAL/PLATELET
Abs Immature Granulocytes: 0.06 10*3/uL (ref 0.00–0.07)
Basophils Absolute: 0 10*3/uL (ref 0.0–0.1)
Basophils Relative: 0 %
Eosinophils Absolute: 0 10*3/uL (ref 0.0–0.5)
Eosinophils Relative: 0 %
HCT: 48.1 % — ABNORMAL HIGH (ref 36.0–46.0)
Hemoglobin: 15.6 g/dL — ABNORMAL HIGH (ref 12.0–15.0)
Immature Granulocytes: 1 %
Lymphocytes Relative: 4 %
Lymphs Abs: 0.4 10*3/uL — ABNORMAL LOW (ref 0.7–4.0)
MCH: 30.8 pg (ref 26.0–34.0)
MCHC: 32.4 g/dL (ref 30.0–36.0)
MCV: 94.9 fL (ref 80.0–100.0)
Monocytes Absolute: 0.6 10*3/uL (ref 0.1–1.0)
Monocytes Relative: 6 %
Neutro Abs: 9.2 10*3/uL — ABNORMAL HIGH (ref 1.7–7.7)
Neutrophils Relative %: 89 %
Platelets: 307 10*3/uL (ref 150–400)
RBC: 5.07 MIL/uL (ref 3.87–5.11)
RDW: 14.9 % (ref 11.5–15.5)
WBC: 10.4 10*3/uL (ref 4.0–10.5)
nRBC: 0 % (ref 0.0–0.2)

## 2019-04-12 LAB — COMPREHENSIVE METABOLIC PANEL
ALT: 24 U/L (ref 0–44)
AST: 37 U/L (ref 15–41)
Albumin: 4.3 g/dL (ref 3.5–5.0)
Alkaline Phosphatase: 75 U/L (ref 38–126)
Anion gap: 11 (ref 5–15)
BUN: <5 mg/dL — ABNORMAL LOW (ref 8–23)
CO2: 29 mmol/L (ref 22–32)
Calcium: 9.3 mg/dL (ref 8.9–10.3)
Chloride: 92 mmol/L — ABNORMAL LOW (ref 98–111)
Creatinine, Ser: 0.55 mg/dL (ref 0.44–1.00)
GFR calc Af Amer: >60 mL/min (ref >60)
GFR calc non Af Amer: >60 mL/min (ref >60)
Glucose, Bld: 121 mg/dL — ABNORMAL HIGH (ref 70–99)
Potassium: 4.3 mmol/L (ref 3.5–5.1)
Sodium: 132 mmol/L — ABNORMAL LOW (ref 135–145)
Total Bilirubin: 0.8 mg/dL (ref 0.3–1.2)
Total Protein: 7 g/dL (ref 6.5–8.1)

## 2019-04-12 MED ORDER — CYCLOBENZAPRINE HCL 10 MG PO TABS
5.0000 mg | ORAL_TABLET | Freq: Once | ORAL | Status: AC
  Administered 2019-04-12: 22:00:00 5 mg via ORAL
  Filled 2019-04-12: qty 1

## 2019-04-12 MED ORDER — CYCLOBENZAPRINE HCL 5 MG PO TABS: 5.0000 mg | ORAL_TABLET | Freq: Three times a day (TID) | ORAL | 0 refills | Status: DC | PRN

## 2019-04-12 MED ORDER — ACETAMINOPHEN 500 MG PO TABS
1000.0000 mg | ORAL_TABLET | Freq: Once | ORAL | Status: AC
  Administered 2019-04-12: 1000 mg via ORAL
  Filled 2019-04-12: qty 2

## 2019-04-12 NOTE — ED Triage Notes (Signed)
Pt reports falling yesterday inside her home. Pt states her back hit the couch. Pt denies hitting head or LOC. Pt c/o pain to back and behind shoulder. Pt states she thinks her foot got caught on the chair leg. Pt noted to have HR of 130 in triage.

## 2019-04-12 NOTE — Discharge Instructions (Signed)
X-rays were good.  Prescription for muscle relaxer.  Can also take Tylenol.  Follow-up with your primary care doctor.

## 2019-04-12 NOTE — ED Provider Notes (Addendum)
[facility] EMERGENCY DEPARTMENT Provider Note   CSN: [id number] Arrival date & time: 04/12/19  1551    History   Chief Complaint Chief Complaint  Patient presents with  . Fall    HPI Taylor Cannon is a 85 y.o. female.     Accidental mechanical fall striking her back on the couch.  No head trauma.  Complains of upper and lower back pain.  No head or neck pain.  No obvious extremity pain.  Daughter reports normal behavior.  Severity of pain is moderate.  Range of motion makes pain worse.     Past Medical History:  Diagnosis Date  . Anxiety   . COPD (chronic obstructive pulmonary disease) ([place])   . Depression   . GERD (gastroesophageal reflux disease)   . On home oxygen therapy    2L via  at night  . TIA (transient ischemic attack)     Patient Active Problem List   Diagnosis Date Noted  . S/P ORIF (open reduction internal fixation) fracture left hip 09/18/15 IM nail  05/27/2018  . Fall at home, initial encounter 02/28/2017  . Hematoma 02/28/2017  . Esophageal stricture 04/11/2016  . IDA (iron deficiency anemia) 04/11/2016  . Intertrochanteric fracture of left femur ([place]) 09/18/2015  . Closed fracture of intertrochanteric section of femur ([place])   . Protein-calorie malnutrition, severe 09/17/2015  . Closed left hip fracture ([place]) 09/16/2015  . COPD (chronic obstructive pulmonary disease) ([place]) 09/16/2015  . Tobacco abuse 09/16/2015  . GERD (gastroesophageal reflux disease) 09/16/2015  . Fall 09/16/2015  . Dysphagia, pharyngoesophageal phase 10/05/2014  . Benign paroxysmal positional vertigo 10/05/2014    Past Surgical History:  Procedure Laterality Date  . APPENDECTOMY    . BALLOON DILATION N/A 07/24/2013   Procedure: BALLOON DILATION to 13.23mm;  Surgeon: Rogene Houston, MD;  Location: [facility];  Service: Endoscopy;  Laterality: N/A;  . BALLOON DILATION N/A 11/11/2014   Procedure: BALLOON DILATION;  Surgeon: Rogene Houston, MD;  Location: [facility];   Service: Endoscopy;  Laterality: N/A;  . CARDIAC ELECTROPHYSIOLOGY STUDY AND ABLATION    . CATARACT EXTRACTION Bilateral   . CERVICAL CONE BIOPSY    . ESOPHAGEAL DILATION N/A 11/30/2014   Procedure: ESOPHAGEAL DILATION;  Surgeon: Rogene Houston, MD;  Location: [facility];  Service: Endoscopy;  Laterality: N/A;  Balloon, 15, 16.5  . ESOPHAGEAL DILATION N/A 06/03/2015   Procedure: ESOPHAGEAL DILATION WITH 16.5 MM BALLOON ;  Surgeon: Rogene Houston, MD;  Location: [facility];  Service: Endoscopy;  Laterality: N/A;  . ESOPHAGEAL DILATION N/A 09/09/2015   Procedure: ESOPHAGEAL DILATION;  Surgeon: Rogene Houston, MD;  [facility];  Service: Endoscopy;  Laterality: N/A;  . ESOPHAGEAL DILATION N/A 05/04/2016   Procedure: ESOPHAGEAL DILATION;  Surgeon: Rogene Houston, MD;  [facility]: [facility];  Service: Endoscopy;  Laterality: N/A;  . ESOPHAGEAL DILATION N/A 06/22/2016   Procedure: ESOPHAGEAL DILATION;  Surgeon: Rogene Houston, MD;  [facility];  Service: Endoscopy;  Laterality: N/A;  . ESOPHAGOGASTRODUODENOSCOPY N/A 11/11/2014   Procedure: ESOPHAGOGASTRODUODENOSCOPY (EGD);  Surgeon: Rogene Houston, MD;  [facility];  Service: Endoscopy;  Laterality: N/A;  125-rescheduled 11/11/14 @ 10:30am Ann notified pt  . ESOPHAGOGASTRODUODENOSCOPY (EGD) WITH ESOPHAGEAL DILATION N/A 08/20/2013   Procedure: ESOPHAGOGASTRODUODENOSCOPY (EGD) WITH ESOPHAGEAL DILATION;  Surgeon: Rogene Houston, MD;  [facility];  Service: Endoscopy;  Laterality: N/A;  345-moved to 730 Ann notified pt  . ESOPHAGOGASTRODUODENOSCOPY (  EGD) WITH PROPOFOL N/A 07/24/2013   Procedure: ESOPHAGOGASTRODUODENOSCOPY (EGD) WITH PROPOFOL UNDER FLUOROSCOPY;  Surgeon: Rogene Houston, MD;  Location: [facility];  Service: Endoscopy;  Laterality: N/A;  . ESOPHAGOGASTRODUODENOSCOPY (EGD) WITH PROPOFOL N/A 11/30/2014   Procedure: ESOPHAGOGASTRODUODENOSCOPY (EGD) WITH PROPOFOL;  Surgeon: Rogene Houston, MD;  Location: [facility];  Service: Endoscopy;  Laterality: N/A;  fluoro not needed  . ESOPHAGOGASTRODUODENOSCOPY (EGD) WITH PROPOFOL N/A 06/03/2015   Procedure: ESOPHAGOGASTRODUODENOSCOPY (EGD) WITH PROPOFOL; HIATUS AT 35 CM AND GE JUNCTION 40 CM;  Surgeon: Rogene Houston, MD;  Location: [facility];  Service: Endoscopy;  Laterality: N/A;  . ESOPHAGOGASTRODUODENOSCOPY (EGD) WITH PROPOFOL N/A 09/09/2015   Procedure: ESOPHAGOGASTRODUODENOSCOPY (EGD) WITH PROPOFOL;  Surgeon: Rogene Houston, MD;  [facility];  Service: Endoscopy;  Laterality: N/A;  8:15  . ESOPHAGOGASTRODUODENOSCOPY (EGD) WITH PROPOFOL N/A 05/04/2016   Procedure: ESOPHAGOGASTRODUODENOSCOPY (EGD) WITH PROPOFOL;  Surgeon: Rogene Houston, MD;  [facility];  Service: Endoscopy;  Laterality: N/A;  9:30  . ESOPHAGOGASTRODUODENOSCOPY (EGD) WITH PROPOFOL N/A 06/22/2016   Procedure: ESOPHAGOGASTRODUODENOSCOPY (EGD) WITH PROPOFOL;  Surgeon: Rogene Houston, MD;  [facility];  Service: Endoscopy;  Laterality: N/A;  11:20  . INTRAMEDULLARY (IM) NAIL INTERTROCHANTERIC Left 09/18/2015   Procedure: INTRAMEDULLARY (IM) NAIL INTERTROCHANTRIC;  Surgeon: Carole Civil, MD;  Location: [facility];  Service: Orthopedics;  Laterality: Left;  . TUBAL LIGATION       OB History   No obstetric history on file.      Home Medications    Prior to Admission medications   Medication Sig Start Date End Date Taking? Authorizing Provider  allopurinol (ZYLOPRIM) 100 MG tablet Take 1 tablet by mouth daily. 05/20/18  Yes [provider]  ALPRAZolam (XANAX) 0.25 MG tablet Take 1 tablet (0.25 mg total) by mouth 2 (two) times daily as needed for anxiety. Patient taking differently: Take 0.125-0.25 mg by mouth 2 (two) times daily as needed for anxiety or sleep.  09/20/15  Yes Kathie Dike, MD  calcium carbonate (OSCAL) 1500 (600 Ca) MG TABS tablet Take 600 mg of elemental calcium by mouth daily.   Yes [provider]  omeprazole (PRILOSEC)  40 MG capsule Take 1 capsule by mouth daily. 03/11/18  Yes [provider]  senna-docusate (SENOKOT-S) 8.6-50 MG tablet Take 1 tablet by mouth daily as needed for mild constipation. 10/07/17  Yes Carole Civil, MD  traMADol (ULTRAM) 50 MG tablet Take 1 tablet (50 mg total) by mouth every 6 (six) hours as needed. 10/07/17  Yes Carole Civil, MD  vitamin C (ASCORBIC ACID) 500 MG tablet Take 500 mg by mouth daily.   Yes [provider]  amitriptyline (ELAVIL) 50 MG tablet Take 50 mg by mouth at bedtime. 10/02/17   [provider]  cyclobenzaprine (FLEXERIL) 5 MG tablet Take 1 tablet (5 mg total) by mouth 3 (three) times daily as needed for muscle spasms. 04/12/19   Nat Christen, MD  mirtazapine (REMERON SOL-TAB) 30 MG disintegrating tablet Take 1 tablet by mouth daily. 05/20/18   [provider]  OXYGEN Inhale 2 L into the lungs at bedtime.    [provider]    Family History No family history on file.  Social History Social History   Tobacco Use  . Smoking status: Current Every Day Smoker    Packs/day: 1.00    Years: 55.00    Pack years: 55.00    Types: Cigarettes  . Smokeless tobacco: Never  Used  . Tobacco comment: quit smoking since 1st of July.  Smoked all her life.   Substance Use Topics  . Alcohol use: Yes    Alcohol/week: 14.0 standard drinks    Types: 14 Shots of liquor per week    Comment: occasoinally  . Drug use: No     Allergies   Other and Oxycodone   Review of Systems Review of Systems  All other systems reviewed and are negative.    Physical Exam Updated Vital Signs BP (!) 145/79 (BP Location: Left Arm)   Pulse (!) 111   Temp 98.3 F (36.8 C) (Oral)   Resp 17   Ht 5\' 8"  (1.727 m)   Wt 38.6 kg   SpO2 97%   BMI 12.92 kg/m   Physical Exam Vitals signs and nursing note reviewed.  Constitutional:      Appearance: She is well-developed.  HENT:     Head: Normocephalic and atraumatic.  Eyes:      Conjunctiva/sclera: Conjunctivae normal.  Neck:     Musculoskeletal: Neck supple.  Cardiovascular:     Rate and Rhythm: Normal rate and regular rhythm.  Pulmonary:     Effort: Pulmonary effort is normal.     Breath sounds: Normal breath sounds.  Abdominal:     General: Bowel sounds are normal.     Palpations: Abdomen is soft.  Musculoskeletal:     Comments: Tender thoracic and lumbar spine.  Skin:    General: Skin is warm and dry.  Neurological:     Mental Status: She is alert and oriented to person, place, and time.  Psychiatric:        Behavior: Behavior normal.      ED Treatments / Results  Labs (all labs ordered are listed, but only abnormal results are displayed) Labs Reviewed  CBC WITH DIFFERENTIAL/PLATELET - Abnormal; Notable for the following components:      Result Value   Hemoglobin 15.6 (*)    HCT 48.1 (*)    Neutro Abs 9.2 (*)    Lymphs Abs 0.4 (*)    All other components within normal limits  COMPREHENSIVE METABOLIC PANEL - Abnormal; Notable for the following components:   Sodium 132 (*)    Chloride 92 (*)    Glucose, Bld 121 (*)    BUN <5 (*)    All other components within normal limits    EKG EKG Interpretation  Date/Time:  Sunday April 12 2019 16:04:54 EDT Ventricular Rate:  123 PR Interval:  158 QRS Duration: 80 QT Interval:  304 QTC Calculation: 435 R Axis:   -69 Text Interpretation:  Sinus tachycardia with occasional Premature ventricular complexes Possible Left atrial enlargement Left axis deviation Low voltage QRS Inferior infarct , age undetermined Cannot rule out Anterior infarct , age undetermined Abnormal ECG Confirmed by Nat Christen [phone]) on 04/12/2019 6:47:20 PM   Radiology Dg Chest 2 View  Result Date: 04/12/2019 CLINICAL DATA:  85 year old female with fall. EXAM: CHEST - 2 VIEW COMPARISON:  Chest radiograph dated 09/16/2015 FINDINGS: The lungs are clear. There is no pleural effusion or pneumothorax. Left apical subpleural  thickening/scarring. Stable cardiomegaly. Atherosclerotic calcification of the aorta. No acute osseous pathology. Osteopenia. IMPRESSION: 1. No active cardiopulmonary disease. 2. No acute bony abnormalities. Electronically Signed   By: Anner Crete M.D.   On: 04/12/2019 21:31   Dg Lumbar Spine Complete  Result Date: 04/12/2019 CLINICAL DATA:  Fall EXAM: LUMBAR SPINE - COMPLETE 4+ VIEW COMPARISON:  MRI lumbar spine dated  10/14/2017 FINDINGS: Normal lumbar lordosis.  Five lumbar-type vertebral bodies. Moderate compression fracture deformity at L1, chronic. No retropulsion. No evidence of acute fracture or dislocation. Vertebral body heights are otherwise maintained. Visualized bony pelvis appears intact. Vascular calcifications. IMPRESSION: No evidence of acute fracture or dislocation. Moderate compression fracture deformity at L1, chronic. Electronically Signed   By: Julian Hy M.D.   On: 04/12/2019 21:37    Procedures Procedures (including critical care time)  Medications Ordered in ED Medications  cyclobenzaprine (FLEXERIL) tablet 5 mg (5 mg Oral Given 04/12/19 2220)  acetaminophen (TYLENOL) tablet 1,000 mg (1,000 mg Oral Given 04/12/19 2220)     Initial Impression / Assessment and Plan / ED Course  I have reviewed the triage vital signs and the nursing notes.  Pertinent labs & imaging results that were available during my care of the patient were reviewed by me and considered in my medical decision making (see chart for details).        Status post accidental mechanical fall.  Pulse noted to be elevated.  Will get PA and LAT of chest  and lumbar spine.  Labs acceptable.  Chest x-ray lumbar spine films negative.  No neurological deficits.  Discharge medications Flexeril 5 mg.  Final Clinical Impressions(s) / ED Diagnoses   Final diagnoses:  Fall, initial encounter  Upper back pain  Acute midline low back pain without sciatica    ED Discharge Orders         Ordered     cyclobenzaprine (FLEXERIL) 5 MG tablet  3 times daily PRN     04/12/19 2218           Nat Christen, MD 04/12/19 2050    Nat Christen, MD 04/12/19 2051    Nat Christen, MD 04/12/19 2221

## 2019-05-12 ENCOUNTER — Inpatient Hospital Stay (HOSPITAL_COMMUNITY)
Admission: EM | Admit: 2019-05-12 | Discharge: 2019-05-15 | DRG: 190 | Disposition: A | Discharge status: Home Health | Payer: Medicare HMO | Attending: Internal Medicine | Admitting: Internal Medicine

## 2019-05-12 ENCOUNTER — Other Ambulatory Visit: Payer: Self-pay

## 2019-05-12 ENCOUNTER — Emergency Department (HOSPITAL_COMMUNITY): Payer: Medicare HMO

## 2019-05-12 ENCOUNTER — Encounter (HOSPITAL_COMMUNITY): Payer: Self-pay

## 2019-05-12 DIAGNOSIS — J441 Chronic obstructive pulmonary disease with (acute) exacerbation: Secondary | ICD-10-CM | POA: Diagnosis not present

## 2019-05-12 DIAGNOSIS — F17210 Nicotine dependence, cigarettes, uncomplicated: Secondary | ICD-10-CM | POA: Diagnosis present

## 2019-05-12 DIAGNOSIS — Z888 Allergy status to other drugs, medicaments and biological substances status: Secondary | ICD-10-CM

## 2019-05-12 DIAGNOSIS — Z681 Body mass index (BMI) 19 or less, adult: Secondary | ICD-10-CM

## 2019-05-12 DIAGNOSIS — Z8673 Personal history of transient ischemic attack (TIA), and cerebral infarction without residual deficits: Secondary | ICD-10-CM

## 2019-05-12 DIAGNOSIS — E43 Unspecified severe protein-calorie malnutrition: Secondary | ICD-10-CM | POA: Diagnosis present

## 2019-05-12 DIAGNOSIS — J9601 Acute respiratory failure with hypoxia: Secondary | ICD-10-CM | POA: Diagnosis not present

## 2019-05-12 DIAGNOSIS — F419 Anxiety disorder, unspecified: Secondary | ICD-10-CM | POA: Diagnosis present

## 2019-05-12 DIAGNOSIS — K219 Gastro-esophageal reflux disease without esophagitis: Secondary | ICD-10-CM | POA: Diagnosis present

## 2019-05-12 DIAGNOSIS — M549 Dorsalgia, unspecified: Secondary | ICD-10-CM | POA: Diagnosis present

## 2019-05-12 DIAGNOSIS — Z7952 Long term (current) use of systemic steroids: Secondary | ICD-10-CM

## 2019-05-12 DIAGNOSIS — R1310 Dysphagia, unspecified: Secondary | ICD-10-CM

## 2019-05-12 DIAGNOSIS — R1314 Dysphagia, pharyngoesophageal phase: Secondary | ICD-10-CM | POA: Diagnosis present

## 2019-05-12 DIAGNOSIS — Z885 Allergy status to narcotic agent status: Secondary | ICD-10-CM

## 2019-05-12 DIAGNOSIS — K222 Esophageal obstruction: Secondary | ICD-10-CM | POA: Diagnosis present

## 2019-05-12 DIAGNOSIS — Z79899 Other long term (current) drug therapy: Secondary | ICD-10-CM

## 2019-05-12 DIAGNOSIS — Z9981 Dependence on supplemental oxygen: Secondary | ICD-10-CM

## 2019-05-12 DIAGNOSIS — F329 Major depressive disorder, single episode, unspecified: Secondary | ICD-10-CM | POA: Diagnosis present

## 2019-05-12 DIAGNOSIS — G8929 Other chronic pain: Secondary | ICD-10-CM | POA: Diagnosis present

## 2019-05-12 DIAGNOSIS — Z20828 Contact with and (suspected) exposure to other viral communicable diseases: Secondary | ICD-10-CM | POA: Diagnosis present

## 2019-05-12 LAB — CBC WITH DIFFERENTIAL/PLATELET
Abs Immature Granulocytes: 0.06 10*3/uL (ref 0.00–0.07)
Basophils Absolute: 0 10*3/uL (ref 0.0–0.1)
Basophils Relative: 0 %
Eosinophils Absolute: 0.2 10*3/uL (ref 0.0–0.5)
Eosinophils Relative: 2 %
HCT: 46.2 % — ABNORMAL HIGH (ref 36.0–46.0)
Hemoglobin: 14.6 g/dL (ref 12.0–15.0)
Immature Granulocytes: 1 %
Lymphocytes Relative: 8 %
Lymphs Abs: 0.8 10*3/uL (ref 0.7–4.0)
MCH: 30.9 pg (ref 26.0–34.0)
MCHC: 31.6 g/dL (ref 30.0–36.0)
MCV: 97.7 fL (ref 80.0–100.0)
Monocytes Absolute: 0.7 10*3/uL (ref 0.1–1.0)
Monocytes Relative: 7 %
Neutro Abs: 7.9 10*3/uL — ABNORMAL HIGH (ref 1.7–7.7)
Neutrophils Relative %: 82 %
Platelets: 377 10*3/uL (ref 150–400)
RBC: 4.73 MIL/uL (ref 3.87–5.11)
RDW: 15.4 % (ref 11.5–15.5)
WBC: 9.6 10*3/uL (ref 4.0–10.5)
nRBC: 0 % (ref 0.0–0.2)

## 2019-05-12 LAB — BASIC METABOLIC PANEL
Anion gap: 8 (ref 5–15)
BUN: 10 mg/dL (ref 8–23)
CO2: 30 mmol/L (ref 22–32)
Calcium: 8.7 mg/dL — ABNORMAL LOW (ref 8.9–10.3)
Chloride: 96 mmol/L — ABNORMAL LOW (ref 98–111)
Creatinine, Ser: 0.7 mg/dL (ref 0.44–1.00)
GFR calc Af Amer: >60 mL/min (ref >60)
GFR calc non Af Amer: >60 mL/min (ref >60)
Glucose, Bld: 105 mg/dL — ABNORMAL HIGH (ref 70–99)
Potassium: 4.1 mmol/L (ref 3.5–5.1)
Sodium: 134 mmol/L — ABNORMAL LOW (ref 135–145)

## 2019-05-12 LAB — TROPONIN I (HIGH SENSITIVITY)
Troponin I (High Sensitivity): 4 ng/L (ref <18)
Troponin I (High Sensitivity): 4 ng/L (ref <18)

## 2019-05-12 LAB — D-DIMER, QUANTITATIVE: D-Dimer, Quant: 0.28 ug/mL-FEU (ref 0.00–0.50)

## 2019-05-12 LAB — LACTIC ACID, PLASMA
Lactic Acid, Venous: 1.1 mmol/L (ref 0.5–1.9)
Lactic Acid, Venous: 1.4 mmol/L (ref 0.5–1.9)

## 2019-05-12 LAB — BRAIN NATRIURETIC PEPTIDE: B Natriuretic Peptide: 69 pg/mL (ref 0.0–100.0)

## 2019-05-12 MED ORDER — ALBUTEROL SULFATE HFA 108 (90 BASE) MCG/ACT IN AERS
6.0000 | INHALATION_SPRAY | Freq: Once | RESPIRATORY_TRACT | Status: AC
  Administered 2019-05-13: 6 via RESPIRATORY_TRACT
  Filled 2019-05-12: qty 6.7

## 2019-05-12 MED ORDER — SODIUM CHLORIDE 0.9 % IV BOLUS
500.0000 mL | Freq: Once | INTRAVENOUS | Status: AC
  Administered 2019-05-12: 500 mL via INTRAVENOUS

## 2019-05-12 NOTE — ED Notes (Signed)
When pt arrived in rm w/O2 sat of 88%, pt placed on 2L per [place]. O2 now 100%

## 2019-05-12 NOTE — ED Notes (Signed)
Pt had cxray at urgent care in [place].  Copy of report sent with pt and disc with pt.

## 2019-05-12 NOTE — ED Provider Notes (Signed)
[facility] EMERGENCY DEPARTMENT Provider Note   CSN: [id number] Arrival date & time: 05/12/19  1750     History   Chief Complaint Chief Complaint  Patient presents with   Tachycardia    HPI Taylor Cannon is a 85 y.o. female.     Patient with history of COPD on oxygen at night only, anxiety, chronic back pain, GERD and TIA presenting from home with a 5-day history of nasal congestion and cough productive of yellow-green mucus.  Family states the congestion "moved into her chest" a couple days ago and they went to urgent care today at [facility].  She was found to be tachycardic to the 120s and her O2 saturation was 88% on room air.  They started azithromycin and sent a COVID test.  They recommended she come to the hospital due to her persistent tachycardia.  Patient states she does not normally use any breathing treatments at home.  She denies feeling short of breath.  She does feel generally weak and has not been eating and drinking very much over the past several days.  Daughter states she has been very exhausted when she tries to walk around and very short of breath.  No documented fevers.  No sick contacts.  No leg pain or leg swelling.  No abdominal pain.  Her back pain is at baseline.  No known coronavirus exposures.  The history is provided by the patient and a relative.    Past Medical History:  Diagnosis Date   Anxiety    COPD (chronic obstructive pulmonary disease) ([place])    Depression    GERD (gastroesophageal reflux disease)    On home oxygen therapy    2L via [place] at night   TIA (transient ischemic attack)     Patient Active Problem List   Diagnosis Date Noted   S/P ORIF (open reduction internal fixation) fracture left hip 09/18/15 IM nail  05/27/2018   Fall at home, initial encounter 02/28/2017   Hematoma 02/28/2017   Esophageal stricture 04/11/2016   IDA (iron deficiency anemia) 04/11/2016   Intertrochanteric fracture of left femur ([place]) 09/18/2015     Closed fracture of intertrochanteric section of femur ([place])    Protein-calorie malnutrition, severe 09/17/2015   Closed left hip fracture ([place]) 09/16/2015   COPD (chronic obstructive pulmonary disease) ([place]) 09/16/2015   Tobacco abuse 09/16/2015   GERD (gastroesophageal reflux disease) 09/16/2015   Fall 09/16/2015   Dysphagia, pharyngoesophageal phase 10/05/2014   Benign paroxysmal positional vertigo 10/05/2014    Past Surgical History:  Procedure Laterality Date   APPENDECTOMY     BALLOON DILATION N/A 07/24/2013   Procedure: BALLOON DILATION to 13.46mm;  Surgeon: Rogene Houston, MD;  Location: [facility] ORS;  Service: Endoscopy;  Laterality: N/A;   BALLOON DILATION N/A 11/11/2014   Procedure: BALLOON DILATION;  Surgeon: Rogene Houston, MD;  Location: [facility] SU[facility];  Service: Endoscopy;  Laterality: N/A;   CARDIAC ELECTROPHYSIOLOGY STUDY AND ABLATION     CATARACT EXTRACTION Bilateral    CERVICAL CONE BIOPSY     ESOPHAGEAL DILATION N/A 11/30/2014   Procedure: ESOPHAGEAL DILATION;  Surgeon: Rogene Houston, MD;  Location: [facility];  Service: Endoscopy;  Laterality: N/A;  Balloon, 15, 16.5   ESOPHAGEAL DILATION N/A 06/03/2015   Procedure: ESOPHAGEAL DILATION WITH 16.5 MM BALLOON ;  Surgeon: Rogene Houston, MD;  Location: [facility];  Service: Endoscopy;  Laterality: N/A;   ESOPHAGEAL DILATION N/A 09/09/2015   Procedure: ESOPHAGEAL DILATION;  Surgeon: Mechele Dawley  Laural Golden, MD;  Location: [facility];  Service: Endoscopy;  Laterality: N/A;   ESOPHAGEAL DILATION N/A 05/04/2016   Procedure: ESOPHAGEAL DILATION;  Surgeon: Rogene Houston, MD;  Location: [facility];  Service: Endoscopy;  Laterality: N/A;   ESOPHAGEAL DILATION N/A 06/22/2016   Procedure: ESOPHAGEAL DILATION;  Surgeon: Rogene Houston, MD;  Location: [facility];  Service: Endoscopy;  Laterality: N/A;   ESOPHAGOGASTRODUODENOSCOPY N/A 11/11/2014   Procedure: ESOPHAGOGASTRODUODENOSCOPY (EGD);  Surgeon: Rogene Houston, MD;   Location: [facility];  Service: Endoscopy;  Laterality: N/A;  125-rescheduled 11/11/14 @ 10:30am Ann notified pt   ESOPHAGOGASTRODUODENOSCOPY (EGD) WITH ESOPHAGEAL DILATION N/A 08/20/2013   Procedure: ESOPHAGOGASTRODUODENOSCOPY (EGD) WITH ESOPHAGEAL DILATION;  Surgeon: Rogene Houston, MD;  [facility]: [facility];  Service: Endoscopy;  Laterality: N/A;  345-moved to 730 Ann notified pt   ESOPHAGOGASTRODUODENOSCOPY (EGD) WITH PROPOFOL N/A 07/24/2013   Procedure: ESOPHAGOGASTRODUODENOSCOPY (EGD) WITH PROPOFOL UNDER FLUOROSCOPY;  Surgeon: Rogene Houston, MD;  Location: [facility];  Service: Endoscopy;  Laterality: N/A;   ESOPHAGOGASTRODUODENOSCOPY (EGD) WITH PROPOFOL N/A 11/30/2014   Procedure: ESOPHAGOGASTRODUODENOSCOPY (EGD) WITH PROPOFOL;  Surgeon: Rogene Houston, MD;  Location: [facility];  Service: Endoscopy;  Laterality: N/A;  fluoro not needed   ESOPHAGOGASTRODUODENOSCOPY (EGD) WITH PROPOFOL N/A 06/03/2015   Procedure: ESOPHAGOGASTRODUODENOSCOPY (EGD) WITH PROPOFOL; HIATUS AT 35 CM AND GE JUNCTION 40 CM;  Surgeon: Rogene Houston, MD;  Location: [facility];  Service: Endoscopy;  Laterality: N/A;   ESOPHAGOGASTRODUODENOSCOPY (EGD) WITH PROPOFOL N/A 09/09/2015   Procedure: ESOPHAGOGASTRODUODENOSCOPY (EGD) WITH PROPOFOL;  Surgeon: Rogene Houston, MD;  [facility]: [facility];  Service: Endoscopy;  Laterality: N/A;  8:15   ESOPHAGOGASTRODUODENOSCOPY (EGD) WITH PROPOFOL N/A 05/04/2016   Procedure: ESOPHAGOGASTRODUODENOSCOPY (EGD) WITH PROPOFOL;  Surgeon: Rogene Houston, MD;  [facility]: [facility];  Service: Endoscopy;  Laterality: N/A;  9:30   ESOPHAGOGASTRODUODENOSCOPY (EGD) WITH PROPOFOL N/A 06/22/2016   Procedure: ESOPHAGOGASTRODUODENOSCOPY (EGD) WITH PROPOFOL;  Surgeon: Rogene Houston, MD;  Location: [facility];  Service: Endoscopy;  Laterality: N/A;  11:20   INTRAMEDULLARY (IM) NAIL INTERTROCHANTERIC Left 09/18/2015   Procedure: INTRAMEDULLARY (IM) NAIL INTERTROCHANTRIC;  Surgeon: Carole Civil, MD;  Location: [facility];  Service: Orthopedics;  Laterality: Left;   TUBAL LIGATION       OB History   No obstetric history on file.      Home Medications    Prior to Admission medications   Medication Sig Start Date End Date Taking? Authorizing Provider  allopurinol (ZYLOPRIM) 100 MG tablet Take 1 tablet by mouth daily. 05/20/18   [provider]  ALPRAZolam Duanne Moron) 0.25 MG tablet Take 1 tablet (0.25 mg total) by mouth 2 (two) times daily as needed for anxiety. Patient taking differently: Take 0.125-0.25 mg by mouth 2 (two) times daily as needed for anxiety or sleep.  09/20/15   Kathie Dike, MD  amitriptyline (ELAVIL) 50 MG tablet Take 50 mg by mouth at bedtime. 10/02/17   [provider]  calcium carbonate (OSCAL) 1500 (600 Ca) MG TABS tablet Take 600 mg of elemental calcium by mouth daily.    [provider]  cyclobenzaprine (FLEXERIL) 5 MG tablet Take 1 tablet (5 mg total) by mouth 3 (three) times daily as needed for muscle spasms. 04/12/19   Nat Christen, MD  mirtazapine (REMERON SOL-TAB) 30 MG disintegrating tablet Take 1 tablet by mouth daily. 05/20/18   [provider]  omeprazole (PRILOSEC) 40 MG capsule Take 1 capsule by  mouth daily. 03/11/18   [provider]  OXYGEN Inhale 2 L into the lungs at bedtime.    [provider]  senna-docusate (SENOKOT-S) 8.6-50 MG tablet Take 1 tablet by mouth daily as needed for mild constipation. 10/07/17   Carole Civil, MD  traMADol (ULTRAM) 50 MG tablet Take 1 tablet (50 mg total) by mouth every 6 (six) hours as needed. 10/07/17   Carole Civil, MD  vitamin C (ASCORBIC ACID) 500 MG tablet Take 500 mg by mouth daily.    [provider]    Family History No family history on file.  Social History Social History   Tobacco Use   Smoking status: Current Every Day Smoker    Packs/day: 1.00    Years: 55.00    Pack years: 55.00    Types: Cigarettes   Smokeless  tobacco: Never Used   Tobacco comment: quit smoking since 1st of July.  Smoked all her life.   Substance Use Topics   Alcohol use: Yes    Comment: 2 shots at night   Drug use: No     Allergies   Other and Oxycodone   Review of Systems Review of Systems  Constitutional: Positive for activity change, appetite change and fatigue. Negative for fever.  HENT: Positive for rhinorrhea. Negative for congestion.   Eyes: Negative for visual disturbance.  Respiratory: Positive for cough, chest tightness and shortness of breath.   Cardiovascular: Negative for chest pain and leg swelling.  Gastrointestinal: Negative for abdominal pain, nausea and vomiting.  Genitourinary: Negative for dysuria.  Musculoskeletal: Negative for arthralgias and myalgias.  Skin: Negative for rash.  Neurological: Positive for weakness. Negative for dizziness, light-headedness and numbness.   all other systems are negative except as noted in the HPI and PMH.    Physical Exam Updated Vital Signs BP 117/81    Pulse (!) 109    Temp 98 F (36.7 C) (Oral)    Resp (!) 30    Ht 5\' 8"  (1.727 m)    Wt 38 kg    SpO2 100%    BMI 12.74 kg/m   Physical Exam Vitals signs and nursing note reviewed.  Constitutional:      General: She is in acute distress.     Appearance: She is well-developed.     Comments: Mildly increased work of breathing  HENT:     Head: Normocephalic and atraumatic.     Nose: Congestion and rhinorrhea present.     Mouth/Throat:     Mouth: Mucous membranes are dry.     Pharynx: No oropharyngeal exudate.  Eyes:     Conjunctiva/sclera: Conjunctivae normal.     Pupils: Pupils are equal, round, and reactive to light.  Neck:     Musculoskeletal: Normal range of motion and neck supple.     Comments: No meningismus. Cardiovascular:     Rate and Rhythm: Normal rate and regular rhythm.     Heart sounds: Normal heart sounds. No murmur.  Pulmonary:     Effort: Respiratory distress present.     Breath  sounds: Normal breath sounds.     Comments: Diminished breath sounds  Mild tachypnea, increased work of breathing Abdominal:     Palpations: Abdomen is soft.     Tenderness: There is no abdominal tenderness. There is no guarding or rebound.  Musculoskeletal: Normal range of motion.        General: No tenderness.  Skin:    General: Skin is warm.  Capillary Refill: Capillary refill takes less than 2 seconds.  Neurological:     General: No focal deficit present.     Mental Status: She is alert and oriented to person, place, and time. Mental status is at baseline.     Cranial Nerves: No cranial nerve deficit.     Motor: No abnormal muscle tone.     Coordination: Coordination normal.     Comments:  5/5 strength throughout. CN 2-12 intact.Equal grip strength.   Psychiatric:        Behavior: Behavior normal.      ED Treatments / Results  Labs (all labs ordered are listed, but only abnormal results are displayed) Labs Reviewed  CBC WITH DIFFERENTIAL/PLATELET - Abnormal; Notable for the following components:      Result Value   HCT 46.2 (*)    Neutro Abs 7.9 (*)    All other components within normal limits  BASIC METABOLIC PANEL - Abnormal; Notable for the following components:   Sodium 134 (*)    Chloride 96 (*)    Glucose, Bld 105 (*)    Calcium 8.7 (*)    All other components within normal limits  SARS CORONAVIRUS 2 (HOSPITAL ORDER, [facility])  CULTURE, BLOOD (ROUTINE X 2)  CULTURE, BLOOD (ROUTINE X 2)  EXPECTORATED SPUTUM ASSESSMENT W REFEX TO RESP CULTURE  URINE CULTURE  BRAIN NATRIURETIC PEPTIDE  D-DIMER, QUANTITATIVE (NOT AT [facility])  LACTIC ACID, PLASMA  LACTIC ACID, PLASMA  HIV ANTIBODY (ROUTINE TESTING W REFLEX)  URINALYSIS, ROUTINE W REFLEX MICROSCOPIC  COMPREHENSIVE METABOLIC PANEL  CBC  TROPONIN I (HIGH SENSITIVITY)  TROPONIN I (HIGH SENSITIVITY)    EKG EKG Interpretation  Date/Time:  Tuesday May 12 2019 18:35:23  EDT Ventricular Rate:  116 PR Interval:  138 QRS Duration: 74 QT Interval:  320 QTC Calculation: 444 R Axis:   -58 Text Interpretation:  Sinus tachycardia Left axis deviation Anterior infarct , age undetermined Abnormal ECG Confirmed by Virgel Manifold [phone]) on 05/12/2019 10:29:36 PM   Radiology Dg Chest Portable 1 View  Result Date: 05/12/2019 CLINICAL DATA:  Shortness of breath EXAM: PORTABLE CHEST 1 VIEW COMPARISON:  04/12/2019 FINDINGS: There is hyperinflation of the lungs compatible with COPD. Cardiomegaly. Moderate-sized hiatal hernia. No confluent opacities or effusions. No acute bony abnormality. IMPRESSION: Cardiomegaly, COPD. Hiatal hernia. No active disease. Electronically Signed   By: Rolm Baptise M.D.   On: 05/12/2019 23:35    Procedures Procedures (including critical care time)  Medications Ordered in ED Medications - No data to display   Initial Impression / Assessment and Plan / ED Course  I have reviewed the triage vital signs and the nursing notes.  Pertinent labs & imaging results that were available during my care of the patient were reviewed by me and considered in my medical decision making (see chart for details).       Patient with history of COPD on home oxygen at night presenting with a 5-day history of cough, congestion and sent from urgent care with increased heart rate and hypoxia.  She has mildly increased work of breathing with clear lungs.  She is not hypoxic on nasal cannula. She is given albuterol and Solu-Medrol as well as IV magnesium and judicious IV fluids.  Chest x-ray is clear without infiltrates.  EKG is sinus tachycardia.  Labs are reassuring with negative troponin and d-dimer.  Patient resting comfortably nasal cannula  But on attempted ambulation she becomes increasingly tachypneic and dropped her saturations  to 68% on 2 L.  She became very short of breath and tachypneic to the 30s.  Will need to be admitted for presumed COPD  exacerbation.  Low suspicion for PE with negative d-dimer.  Troponins are negative. COVID is negative.   Admission d/w Dr. Clearence Ped.   Drina C Swader was evaluated in Emergency Department on 05/13/2019 for the symptoms described in the history of present illness. She was evaluated in the context of the global COVID-19 pandemic, which necessitated consideration that the patient might be at risk for infection with the SARS-CoV-2 virus that causes COVID-19. Institutional protocols and algorithms that pertain to the evaluation of patients at risk for COVID-19 are in a state of rapid change based on information released by regulatory bodies including the CDC and federal and state organizations. These policies and algorithms were followed during the patient's care in the ED.  CRITICAL CARE Performed by: Ezequiel Essex Total critical care time: 35 minutes Critical care time was exclusive of separately billable procedures and treating other patients. Critical care was necessary to treat or prevent imminent or life-threatening deterioration. Critical care was time spent personally by me on the following activities: development of treatment plan with patient and/or surrogate as well as nursing, discussions with consultants, evaluation of patient's response to treatment, examination of patient, obtaining history from patient or surrogate, ordering and performing treatments and interventions, ordering and review of laboratory studies, ordering and review of radiographic studies, pulse oximetry and re-evaluation of patient's condition.   Final Clinical Impressions(s) / ED Diagnoses   Final diagnoses:  Acute respiratory failure with hypoxia [facility])    ED Discharge Orders    None       Ezequiel Essex, MD 05/13/19 [phone]

## 2019-05-12 NOTE — ED Triage Notes (Signed)
Pt reports has had sinus congestion for the past few days and has since moved into her chest.  Denies sob.  Pt went to an urgent care in [place] today and was told she needed evaluation in er because pt's hr elevated at 122 and o2 sat 88% on room air.  Daughter says they started pt on an antibiotic and did a covid test.  Results not available at this time.  Pt was started on azithromycin.  Daughter brought pt to ed because heartrate continued to stay elevated around 120.  Pt wears o2 at 2liters at night.  Pt denies any pain.  Reports low grade fever today.

## 2019-05-13 DIAGNOSIS — Z8673 Personal history of transient ischemic attack (TIA), and cerebral infarction without residual deficits: Secondary | ICD-10-CM | POA: Diagnosis not present

## 2019-05-13 DIAGNOSIS — Z681 Body mass index (BMI) 19 or less, adult: Secondary | ICD-10-CM | POA: Diagnosis not present

## 2019-05-13 DIAGNOSIS — E43 Unspecified severe protein-calorie malnutrition: Secondary | ICD-10-CM | POA: Diagnosis present

## 2019-05-13 DIAGNOSIS — I361 Nonrheumatic tricuspid (valve) insufficiency: Secondary | ICD-10-CM | POA: Diagnosis not present

## 2019-05-13 DIAGNOSIS — Z7952 Long term (current) use of systemic steroids: Secondary | ICD-10-CM | POA: Diagnosis not present

## 2019-05-13 DIAGNOSIS — K219 Gastro-esophageal reflux disease without esophagitis: Secondary | ICD-10-CM | POA: Diagnosis present

## 2019-05-13 DIAGNOSIS — J9601 Acute respiratory failure with hypoxia: Secondary | ICD-10-CM | POA: Diagnosis present

## 2019-05-13 DIAGNOSIS — Z79899 Other long term (current) drug therapy: Secondary | ICD-10-CM | POA: Diagnosis not present

## 2019-05-13 DIAGNOSIS — F419 Anxiety disorder, unspecified: Secondary | ICD-10-CM | POA: Diagnosis present

## 2019-05-13 DIAGNOSIS — Z9981 Dependence on supplemental oxygen: Secondary | ICD-10-CM | POA: Diagnosis not present

## 2019-05-13 DIAGNOSIS — Z885 Allergy status to narcotic agent status: Secondary | ICD-10-CM | POA: Diagnosis not present

## 2019-05-13 DIAGNOSIS — Z888 Allergy status to other drugs, medicaments and biological substances status: Secondary | ICD-10-CM | POA: Diagnosis not present

## 2019-05-13 DIAGNOSIS — K222 Esophageal obstruction: Secondary | ICD-10-CM | POA: Diagnosis present

## 2019-05-13 DIAGNOSIS — J441 Chronic obstructive pulmonary disease with (acute) exacerbation: Secondary | ICD-10-CM | POA: Diagnosis present

## 2019-05-13 DIAGNOSIS — F17210 Nicotine dependence, cigarettes, uncomplicated: Secondary | ICD-10-CM | POA: Diagnosis present

## 2019-05-13 DIAGNOSIS — F329 Major depressive disorder, single episode, unspecified: Secondary | ICD-10-CM | POA: Diagnosis present

## 2019-05-13 DIAGNOSIS — Z20828 Contact with and (suspected) exposure to other viral communicable diseases: Secondary | ICD-10-CM | POA: Diagnosis present

## 2019-05-13 DIAGNOSIS — M549 Dorsalgia, unspecified: Secondary | ICD-10-CM | POA: Diagnosis present

## 2019-05-13 DIAGNOSIS — R1314 Dysphagia, pharyngoesophageal phase: Secondary | ICD-10-CM | POA: Diagnosis present

## 2019-05-13 DIAGNOSIS — G8929 Other chronic pain: Secondary | ICD-10-CM | POA: Diagnosis present

## 2019-05-13 LAB — COMPREHENSIVE METABOLIC PANEL
ALT: 21 U/L (ref 0–44)
AST: 37 U/L (ref 15–41)
Albumin: 3.1 g/dL — ABNORMAL LOW (ref 3.5–5.0)
Alkaline Phosphatase: 67 U/L (ref 38–126)
Anion gap: 10 (ref 5–15)
BUN: 10 mg/dL (ref 8–23)
CO2: 23 mmol/L (ref 22–32)
Calcium: 8.2 mg/dL — ABNORMAL LOW (ref 8.9–10.3)
Chloride: 101 mmol/L (ref 98–111)
Creatinine, Ser: 0.59 mg/dL (ref 0.44–1.00)
GFR calc Af Amer: >60 mL/min (ref >60)
GFR calc non Af Amer: >60 mL/min (ref >60)
Glucose, Bld: 204 mg/dL — ABNORMAL HIGH (ref 70–99)
Potassium: 3.4 mmol/L — ABNORMAL LOW (ref 3.5–5.1)
Sodium: 134 mmol/L — ABNORMAL LOW (ref 135–145)
Total Bilirubin: 0.4 mg/dL (ref 0.3–1.2)
Total Protein: 5.3 g/dL — ABNORMAL LOW (ref 6.5–8.1)

## 2019-05-13 LAB — SARS CORONAVIRUS 2 BY RT PCR (HOSPITAL ORDER, PERFORMED IN ~~LOC~~ HOSPITAL LAB): SARS Coronavirus 2: NEGATIVE

## 2019-05-13 LAB — CBC
HCT: 40.3 % (ref 36.0–46.0)
Hemoglobin: 12.6 g/dL (ref 12.0–15.0)
MCH: 31 pg (ref 26.0–34.0)
MCHC: 31.3 g/dL (ref 30.0–36.0)
MCV: 99.3 fL (ref 80.0–100.0)
Platelets: 271 10*3/uL (ref 150–400)
RBC: 4.06 MIL/uL (ref 3.87–5.11)
RDW: 15.4 % (ref 11.5–15.5)
WBC: 8.1 10*3/uL (ref 4.0–10.5)
nRBC: 0 % (ref 0.0–0.2)

## 2019-05-13 LAB — TSH: TSH: 2.482 u[IU]/mL (ref 0.350–4.500)

## 2019-05-13 MED ORDER — TRAMADOL HCL 50 MG PO TABS
50.0000 mg | ORAL_TABLET | Freq: Four times a day (QID) | ORAL | Status: DC | PRN
  Administered 2019-05-15: 50 mg via ORAL
  Filled 2019-05-13: qty 1

## 2019-05-13 MED ORDER — ACETYLCYSTEINE 20 % IN SOLN
2.0000 mL | Freq: Three times a day (TID) | RESPIRATORY_TRACT | Status: DC
  Administered 2019-05-13 – 2019-05-15 (×6): 2 mL via RESPIRATORY_TRACT
  Filled 2019-05-13 (×6): qty 4

## 2019-05-13 MED ORDER — PREDNISONE 20 MG PO TABS
40.0000 mg | ORAL_TABLET | Freq: Every day | ORAL | Status: DC
  Administered 2019-05-13 – 2019-05-14 (×2): 40 mg via ORAL
  Filled 2019-05-13 (×3): qty 2

## 2019-05-13 MED ORDER — POTASSIUM CHLORIDE CRYS ER 20 MEQ PO TBCR
40.0000 meq | EXTENDED_RELEASE_TABLET | Freq: Once | ORAL | Status: AC
  Administered 2019-05-13: 40 meq via ORAL
  Filled 2019-05-13: qty 2

## 2019-05-13 MED ORDER — BUDESONIDE 0.25 MG/2ML IN SUSP
0.2500 mg | Freq: Two times a day (BID) | RESPIRATORY_TRACT | Status: DC
  Administered 2019-05-13 – 2019-05-15 (×5): 0.25 mg via RESPIRATORY_TRACT
  Filled 2019-05-13 (×5): qty 2

## 2019-05-13 MED ORDER — ADULT MULTIVITAMIN W/MINERALS CH
1.0000 | ORAL_TABLET | Freq: Every day | ORAL | Status: DC
  Administered 2019-05-14: 1 via ORAL
  Filled 2019-05-13 (×2): qty 1

## 2019-05-13 MED ORDER — MAGNESIUM SULFATE 2 GM/50ML IV SOLN
2.0000 g | Freq: Once | INTRAVENOUS | Status: AC
  Administered 2019-05-13: 2 g via INTRAVENOUS
  Filled 2019-05-13: qty 50

## 2019-05-13 MED ORDER — DOXYCYCLINE HYCLATE 100 MG PO TABS
100.0000 mg | ORAL_TABLET | Freq: Once | ORAL | Status: AC
  Administered 2019-05-13: 100 mg via ORAL
  Filled 2019-05-13: qty 1

## 2019-05-13 MED ORDER — METOPROLOL TARTRATE 25 MG PO TABS
12.5000 mg | ORAL_TABLET | Freq: Two times a day (BID) | ORAL | Status: DC
  Administered 2019-05-13: 12.5 mg via ORAL
  Filled 2019-05-13 (×2): qty 1

## 2019-05-13 MED ORDER — MOMETASONE FURO-FORMOTEROL FUM 200-5 MCG/ACT IN AERO
1.0000 | INHALATION_SPRAY | Freq: Two times a day (BID) | RESPIRATORY_TRACT | Status: DC
  Administered 2019-05-13 – 2019-05-15 (×5): 1 via RESPIRATORY_TRACT
  Filled 2019-05-13: qty 8.8

## 2019-05-13 MED ORDER — ALBUTEROL SULFATE (2.5 MG/3ML) 0.083% IN NEBU: 2.5000 mg | INHALATION_SOLUTION | RESPIRATORY_TRACT | Status: DC | PRN

## 2019-05-13 MED ORDER — ALLOPURINOL 100 MG PO TABS
100.0000 mg | ORAL_TABLET | Freq: Every day | ORAL | Status: DC
  Administered 2019-05-13 – 2019-05-14 (×2): 100 mg via ORAL
  Filled 2019-05-13 (×3): qty 1

## 2019-05-13 MED ORDER — ACETAMINOPHEN 325 MG PO TABS: 650.0000 mg | ORAL_TABLET | Freq: Four times a day (QID) | ORAL | Status: DC | PRN

## 2019-05-13 MED ORDER — LEVALBUTEROL HCL 0.63 MG/3ML IN NEBU
0.6300 mg | INHALATION_SOLUTION | Freq: Four times a day (QID) | RESPIRATORY_TRACT | Status: DC | PRN
  Administered 2019-05-13 – 2019-05-15 (×6): 0.63 mg via RESPIRATORY_TRACT
  Filled 2019-05-13 (×6): qty 3

## 2019-05-13 MED ORDER — ENSURE ENLIVE PO LIQD
237.0000 mL | Freq: Two times a day (BID) | ORAL | Status: DC
  Administered 2019-05-13 – 2019-05-14 (×4): 237 mL via ORAL
  Filled 2019-05-13 (×2): qty 237

## 2019-05-13 MED ORDER — ACETAMINOPHEN 650 MG RE SUPP: 650.0000 mg | Freq: Four times a day (QID) | RECTAL | Status: DC | PRN

## 2019-05-13 MED ORDER — AMITRIPTYLINE HCL 25 MG PO TABS
50.0000 mg | ORAL_TABLET | Freq: Every day | ORAL | Status: DC
  Administered 2019-05-13 – 2019-05-14 (×2): 50 mg via ORAL
  Filled 2019-05-13 (×2): qty 2

## 2019-05-13 MED ORDER — METHYLPREDNISOLONE SODIUM SUCC 125 MG IJ SOLR
60.0000 mg | Freq: Once | INTRAMUSCULAR | Status: AC
  Administered 2019-05-13: 60 mg via INTRAVENOUS
  Filled 2019-05-13: qty 2

## 2019-05-13 MED ORDER — SODIUM CHLORIDE 0.9 % IV SOLN
1.0000 g | INTRAVENOUS | Status: DC
  Administered 2019-05-13 – 2019-05-15 (×3): 1 g via INTRAVENOUS
  Filled 2019-05-13 (×3): qty 10

## 2019-05-13 MED ORDER — NICOTINE 14 MG/24HR TD PT24
14.0000 mg | MEDICATED_PATCH | Freq: Every day | TRANSDERMAL | Status: DC
  Administered 2019-05-13 – 2019-05-15 (×3): 14 mg via TRANSDERMAL
  Filled 2019-05-13 (×3): qty 1

## 2019-05-13 MED ORDER — PANTOPRAZOLE SODIUM 40 MG PO TBEC
40.0000 mg | DELAYED_RELEASE_TABLET | Freq: Every day | ORAL | Status: DC
  Administered 2019-05-13 – 2019-05-14 (×2): 40 mg via ORAL
  Filled 2019-05-13 (×3): qty 1

## 2019-05-13 MED ORDER — ALPRAZOLAM 0.25 MG PO TABS
0.1250 mg | ORAL_TABLET | Freq: Two times a day (BID) | ORAL | Status: DC | PRN
  Administered 2019-05-13: 0.25 mg via ORAL
  Administered 2019-05-13 – 2019-05-14 (×2): 0.125 mg via ORAL
  Filled 2019-05-13 (×6): qty 1

## 2019-05-13 MED ORDER — ENOXAPARIN SODIUM 30 MG/0.3ML ~~LOC~~ SOLN
30.0000 mg | SUBCUTANEOUS | Status: DC
  Administered 2019-05-13 – 2019-05-15 (×3): 30 mg via SUBCUTANEOUS
  Filled 2019-05-13 (×3): qty 0.3

## 2019-05-13 MED ORDER — SENNOSIDES-DOCUSATE SODIUM 8.6-50 MG PO TABS: 1.0000 | ORAL_TABLET | Freq: Every day | ORAL | Status: DC | PRN

## 2019-05-13 NOTE — Plan of Care (Signed)
  Problem: Acute Rehab PT Goals(only PT should resolve) Goal: Patient Will Transfer Sit To/From Stand Outcome: Progressing Flowsheets (Taken 05/13/2019 1516) Patient will transfer sit to/from stand: with modified independence Goal: Pt Will Transfer Bed To Chair/Chair To Bed Outcome: Progressing Flowsheets (Taken 05/13/2019 1516) Pt will Transfer Bed to Chair/Chair to Bed: with modified independence Goal: Pt Will Ambulate Outcome: Progressing Flowsheets (Taken 05/13/2019 1516) Pt will Ambulate:  75 feet  with cane  with modified independence Note: Quad-cane   3:17 PM, 05/13/19 Lonell Grandchild, MPT Physical Therapist with [facility] [phone] office [phone] mobile phone

## 2019-05-13 NOTE — Progress Notes (Signed)
Patient reports difficulty swallowing medications and food at times. States she has "had my esophagus stretched in the past." daughter at bedside states she has had this done several times and requested MD call her on rounds if possible tomorrow to discuss concerns. Notified MD. Donavan Foil, RN

## 2019-05-13 NOTE — Evaluation (Signed)
Physical Therapy Evaluation Patient Details Name: RAVON RENNEY MRN: [id number] DOB: 03/30/1934 Today's Date: 05/13/2019   History of Present Illness  Milani Mullen  is a 85 y.o. female, with history of TIA, COPD requiring 2 L nasal cannula at night, anxiety and GERD with esophageal webs presents today for congestion.  Patient reports that 2 weeks ago she started saying she had upper respiratory congestion and then 4 days ago she said she wanted to be seen because she felt like it was moving to her chest.  It was the holiday weekend so they can go in then.  They went to urgent care this morning in urgent care noticed that her heart rate was elevated and oxygen saturations were decreased they advised patient to going to be seen by the ER.  Patient wanted come in so she hospitals could be done.  They tested her for COVID and sent her home telling her to use her supplemental oxygen during the day as well.  Patient has a pulse oximeter at home and she noticed that her heart rate was 137 and her oxygen saturation was 95% she called her daughter and they decided to come into the ED.  Patient has associated chronic fatigue since this congestion started.  Daughter reports patient will only get out of bed for an hour at a time and then go back to bed with the slightest amount of exertion exhausting her.  In the ED patient was saturating in the mid 90s on 2 L nasal cannula but upon ambulation she dropped to 69%.  She is also tachypneic in the mid 20s.  Heart rate was 120.  She was started on Solu-Medrol, doxycycline, given a 500 mL bolus and given albuterol in the ED.  COVID was negative.  Chest x-ray showed cardiomegaly, COPD, hiatal hernia, no acute active disease.  Tropes were stable x2.  Electrolytes were good.  Lactic acid was 1.1 blood cultures were drawn.  Patient is being admitted for the management of a COPD exacerbation.    Clinical Impression  Patient functioning near baseline for functional mobility and  gait, limited for ambulation due to c/o fatigue, no loss of balance, demonstrates good return for transferring to commode in bathroom.  Patient requested to go back to bed after therapy due to c/o fatigue.  Patient will benefit from continued physical therapy in hospital and recommended venue below to increase strength, balance, endurance for safe ADLs and gait.     Follow Up Recommendations Home health PT;Supervision - Intermittent    Equipment Recommendations  None recommended by PT    Recommendations for Other Services       Precautions / Restrictions Precautions Precautions: Fall Restrictions Weight Bearing Restrictions: No      Mobility  Bed Mobility Overal bed mobility: Modified Independent                Transfers Overall transfer level: Modified independent               General transfer comment: increased time, demonstrates good return for transferring to commode in bathroom, standing over sink to wash hands afterwards  Ambulation/Gait Ambulation/Gait assistance: Supervision Gait Distance (Feet): 50 Feet Assistive device: Quad cane(wide based quad cane) Gait Pattern/deviations: Step-through pattern;Decreased step length - left;Decreased stance time - right;Decreased stride length Gait velocity: decreased   General Gait Details: slightly labored cadene with mostly step-through pattern, no loss of balance, on room air with SpO2 at 90-91%  Stairs  Wheelchair Mobility    Modified Rankin (Stroke Patients Only)       Balance Overall balance assessment: Needs assistance Sitting-balance support: Feet supported;No upper extremity supported Sitting balance-Leahy Scale: Good     Standing balance support: Single extremity supported;During functional activity Standing balance-Leahy Scale: Fair Standing balance comment: fair/good using quad-cane                             Pertinent Vitals/Pain Pain Assessment: No/denies  pain    Home Living Family/patient expects to be discharged to:: Private residence Living Arrangements: Alone Available Help at Discharge: Family;Available PRN/intermittently Type of Home: Mobile home Home Access: Stairs to enter Entrance Stairs-Rails: Right;Left;Can reach both Entrance Stairs-Number of Steps: 4 Home Layout: One level Home Equipment: Walker - 2 wheels;Cane - quad;Walker - 4 wheels;Shower seat;Toilet riser      Prior Function Level of Independence: Independent with assistive device(s)         Comments: household ambulator using Quad-cane     Hand Dominance   Dominant Hand: Right    Extremity/Trunk Assessment   Upper Extremity Assessment Upper Extremity Assessment: Defer to OT evaluation    Lower Extremity Assessment Lower Extremity Assessment: Generalized weakness    Cervical / Trunk Assessment Cervical / Trunk Assessment: Kyphotic  Communication   Communication: No difficulties  Cognition Arousal/Alertness: Awake/alert Behavior During Therapy: WFL for tasks assessed/performed Overall Cognitive Status: Within Functional Limits for tasks assessed                                        General Comments      Exercises     Assessment/Plan    PT Assessment Patient needs continued PT services  PT Problem List Decreased strength;Decreased activity tolerance;Decreased balance;Decreased mobility       PT Treatment Interventions Gait training;Stair training;Functional mobility training;Therapeutic activities;Therapeutic exercise;Patient/family education    PT Goals (Current goals can be found in the Care Plan section)  Acute Rehab PT Goals Patient Stated Goal: return home with family to assist PT Goal Formulation: With patient Time For Goal Achievement: 05/15/19 Potential to Achieve Goals: Good    Frequency Min 2X/week   Barriers to discharge        Co-evaluation               AM-PAC PT "6 Clicks" Mobility   Outcome Measure Help needed turning from your back to your side while in a flat bed without using bedrails?: None Help needed moving from lying on your back to sitting on the side of a flat bed without using bedrails?: None Help needed moving to and from a bed to a chair (including a wheelchair)?: None Help needed standing up from a chair using your arms (e.g., wheelchair or bedside chair)?: None Help needed to walk in hospital room?: A Little Help needed climbing 3-5 steps with a railing? : A Little 6 Click Score: 22    End of Session Equipment Utilized During Treatment: Oxygen Activity Tolerance: Patient tolerated treatment well;Patient limited by fatigue Patient left: in bed;with call bell/phone within reach Nurse Communication: Mobility status PT Visit Diagnosis: Unsteadiness on feet (R26.81);Other abnormalities of gait and mobility (R26.89);Muscle weakness (generalized) (M62.81)    Time: [id number] PT Time Calculation (min) (ACUTE ONLY): 28 min   Charges:   PT Evaluation $PT Eval Moderate Complexity: 1 Mod PT Treatments $Therapeutic  Activity: 23-37 mins       3:15 PM, 05/13/19 Lonell Grandchild, MPT Physical Therapist with [facility] [phone] office [phone] mobile phone

## 2019-05-13 NOTE — Progress Notes (Signed)
Initial Nutrition Assessment  DOCUMENTATION CODES:   Underweight  INTERVENTION:  -Magic cup TID with meals, each supplement provides 290 kcal and 9 grams of protein -Diet liberalization -MVI with minerals daily  Continue Ensure Enlive po BID, each supplement provides 350 kcal and 20 grams of protein   NUTRITION DIAGNOSIS:   Inadequate oral intake related to poor appetite as evidenced by energy intake < or equal to 75% for > or equal to 1 month, per patient/family report, percent weight loss(underweight).   GOAL:   Patient will meet greater than or equal to 90% of their needs, Weight gain   MONITOR:   PO intake, Weight trends, Supplement acceptance, Labs, I & O's, Diet advancement  REASON FOR ASSESSMENT:   Consult Assessment of nutrition requirement/status  ASSESSMENT:  RD working remotely.  85 year old female with medical history significant of TIA, COPD on 2L O2 at night, GERD with esophageal webs, s/p esophageal dilation. Patient presented to ED for evaluation of 2 week history of cough and upper respiratory congestion  Admitted with management of COPD exacerbation  Spoke with patient via phone who stated she "is not ready to run any marathons, but feeling a little bit better today" Patient reports typical daily intake includes a couple of small meals. She recalls taking her medications in the morning with a cup of coffee then returns to bed until 11 am. At that time she has a peanut butter and peach preserves sandwich. Patient reports that her daughter prepares dinner and encourages her to drink Ensure Plus daily  Patient reports that the Ensure makes her feel full and finds it difficult to eat anything afterwards. RD encouraged patient to split ONS throughout the day in between meal times to maximize overall intake.   Patient documented with 50% x 1 meal. Patient apologetically reported that she did not care for her meal; Stating "Im sorry, it just did not have any  flavor and I could not eat the peas" RD assured patient that she did not need to apologize; encouraged patient to share food preferences. Patient reported that she usually likes just about everything. Patient agreeable to chocolate and vanilla magic cup with meals. Recommend diet liberalization to increase patient choices.  Current wt 38 kg (83.6 lb) Weight history reviewed: 41.7 kg - 38 kg over the past year UBW 92 lb (last wt at PCP office a few months ago per pt) 91% of UBW  Medications reviewed and include: nebulizer, lovenox, protonix, prednisone, rocephin IVPB  Labs: Na 134 (L) Potassium 3.4 (L) Ca corrects for low albumin 8.9   NUTRITION - FOCUSED PHYSICAL EXAM: Unable to complete at this time; RD working remotely.    Diet Order:   Diet Order            Diet heart healthy/carb modified Room service appropriate? Yes; Fluid consistency: Thin  Diet effective now              EDUCATION NEEDS:   Education needs have been addressed  Skin:  Skin Assessment: Reviewed RN Assessment  Last BM:  9/8  Height:   Ht Readings from Last 1 Encounters:  05/12/19 5\' 8"  (1.727 m)    Weight:   Wt Readings from Last 1 Encounters:  05/12/19 38 kg    Ideal Body Weight:  63.6 kg  BMI:  Body mass index is 12.74 kg/m.  Estimated Nutritional Needs:   Kcal:  1330-1520 (35-40 kcal/kg)  Protein:  67-76  Fluid:  >1.3L/day   Taylor Cannon  Taylor Cannon, Taylor Cannon, Taylor Cannon [phone] After Hours/Weekend Pager: [phone]

## 2019-05-13 NOTE — Progress Notes (Signed)
Patient HR maintaining in the low 120s. On assessment, patient states "I just feel tired and different. i'm jittery after that last breathing treatment". Denies chest pain/discomfort, shortness of breath or palpitations. HR regular but tachy on auscultation. VSS otherwise. See flowsheet. Reported some dizziness on changing positions when assisted back to bed. bedalarm on for safety. Personal items, call light within reach., notiifed Dr. Manuella Ghazi of HR and vitals. Stated okay, monitor for now. Donavan Foil, RN

## 2019-05-13 NOTE — TOC Initial Note (Addendum)
Transition of Care [facility]) - Initial/Assessment Note    Patient Details  Name: Taylor Cannon MRN: [id number] Date of Birth: 10/03/1933  Transition of Care [facility]) CM/SW Contact:    Taylor Cannon, Taylor Reading, RN Phone Number: 05/13/2019, 12:16 PM  Clinical Narrative:   COPD exacerbation. From home alone, daughter close by. Wears HS O2 ([facility]). Recommended for home health PT. Agreeable, elects [facility], asking for Taylor Cannon PT by name. Walks with cane, has RW, rollator, BSC and raised toile seat. Has PCP, daughter drives her to appointments, reports no issues with obtaining medications.  She will need home o2 eval to assess need for continuous oxygen prior to DC.            Expected Discharge Plan: [place] Barriers to Discharge: No Barriers Identified   Patient Goals and CMS Choice Patient states their goals for this hospitalization and ongoing recovery are:: return home today, breathe easier CMS Medicare.gov Compare Post Acute Care list provided to:: Patient Choice offered to / list presented to : Patient, Adult Children  Expected Discharge Plan and Services Expected Discharge Plan: [place]   Discharge Planning Services: CM Consult Post Acute Care Choice: [place] arrangements for the past 2 months: [place] Expected Discharge Date: 05/15/19                         HH Arranged: PT HH Agency: [facility] ([place]) Date [place]: 05/13/19 Time [place]: 1215 Representative spoke with at [place]: Taylor Cannon  Prior Living Arrangements/Services Living arrangements for the past 2 months: [place] Lives with:: Self Patient language and need for interpreter reviewed:: Yes Do you feel safe going back to the place where you live?: Yes      Need for Family Participation in Patient Care: Yes (Comment) Care giver support system in place?: Yes (comment) Current home  services: DME(night time oxygen with [facility]) Criminal Activity/Legal Involvement Pertinent to Current Situation/Hospitalization: No - Comment as needed  Activities of Daily Living Home Assistive Devices/Equipment: None ADL Screening (condition at time of admission) Patient's cognitive ability adequate to safely complete daily activities?: Yes Is the patient deaf or have difficulty hearing?: Yes Does the patient have difficulty seeing, even when wearing glasses/contacts?: No Does the patient have difficulty concentrating, remembering, or making decisions?: No Patient able to express need for assistance with ADLs?: No Does the patient have difficulty dressing or bathing?: No Independently performs ADLs?: Yes (appropriate for developmental age) Walks in Home: Independent Does the patient have difficulty walking or climbing stairs?: No Weakness of Legs: None Weakness of Arms/Hands: None  Permission Sought/Granted   Permission granted to share information with : Yes, Verbal Permission Granted     Permission granted to share info w AGENCY: [facility]         Admission diagnosis:  Acute respiratory failure with hypoxia ([place]) [J96.01] Patient Active Problem List   Diagnosis Date Noted  . COPD exacerbation ([place]) 05/13/2019  . S/P ORIF (open reduction internal fixation) fracture left hip 09/18/15 IM nail  05/27/2018  . Fall at home, initial encounter 02/28/2017  . Hematoma 02/28/2017  . Esophageal stricture 04/11/2016  . IDA (iron deficiency anemia) 04/11/2016  . Intertrochanteric fracture of left femur ([place]) 09/18/2015  . Closed fracture of intertrochanteric section of femur ([place])   . Protein-calorie malnutrition, severe 09/17/2015  . Closed left hip fracture ([place]) 09/16/2015  .  COPD (chronic obstructive pulmonary disease) ([place]) 09/16/2015  . Tobacco abuse 09/16/2015  . GERD (gastroesophageal reflux disease) 09/16/2015  . Fall 09/16/2015  . Dysphagia, pharyngoesophageal phase  10/05/2014  . Benign paroxysmal positional vertigo 10/05/2014   PCP:  [name]:   [facility] [place] [address] [place] [address] Phone: [phone] Fax: [phone]  [id number] [address], [place] [address] Phone: [phone] Fax: [phone]     Social Determinants of Health (SDOH) Interventions    Readmission Risk Interventions No flowsheet data found.

## 2019-05-13 NOTE — ED Notes (Signed)
Pt ambulated w/pulse ox. O2 fell to 69% on 2L, pt assisted back to bed, O2 now up to 95%. MD notified

## 2019-05-13 NOTE — Progress Notes (Signed)
Per HPI: Taylor Cannon  is a 85 y.o. female, with history of TIA, COPD requiring 2 L nasal cannula at night, anxiety and GERD with esophageal webs presents today for congestion.  Patient reports that 2 weeks ago she started saying she had upper respiratory congestion and then 4 days ago she said she wanted to be seen because she felt like it was moving to her chest.  It was the holiday weekend so they can go in then.  They went to urgent care this morning in urgent care noticed that her heart rate was elevated and oxygen saturations were decreased they advised patient to going to be seen by the ER.  Patient wanted come in so she hospitals could be done.  They tested her for COVID and sent her home telling her to use her supplemental oxygen during the day as well.  Patient has a pulse oximeter at home and she noticed that her heart rate was 137 and her oxygen saturation was 95% she called her daughter and they decided to come into the ED.  Patient has associated chronic fatigue since this congestion started.  Daughter reports patient will only get out of bed for an hour at a time and then go back to bed with the slightest amount of exertion exhausting her.  In the ED patient was saturating in the mid 90s on 2 L nasal cannula but upon ambulation she dropped to 69%.  She is also tachypneic in the mid 20s.  Heart rate was 120.  She was started on Solu-Medrol, doxycycline, given a 500 mL bolus and given albuterol in the ED.  COVID was negative.  Chest x-ray showed cardiomegaly, COPD, hiatal hernia, no acute active disease.  Tropes were stable x2.  Electrolytes were good.  Lactic acid was 1.1 blood cultures were drawn.  Patient is being admitted for the management of a COPD exacerbation.  Patient seen and evaluated at bedside this morning.  She is on 2 L nasal cannula oxygen.  She does not appear to be in acute respiratory distress, but continues to have significant amounts of cough and congestion.  She remains  tachycardic in the 110 bpm range, but otherwise asymptomatic in this regard.  She still has significant amounts of chest congestion and desaturates fairly quickly with ambulation.  Plan to continue oral steroids as well as current antibiotics with Rocephin and doxycycline.  Recheck a.m. labs.  Plan to add Pulmicort as well as Mucomyst today to help further with chest congestion.  Also adding flutter valve.  Hopefully can plan for discharge in the next 1 to 2 days if further improved.  Total care time: 25 minutes.

## 2019-05-13 NOTE — Evaluation (Signed)
Occupational Therapy Evaluation Patient Details Name: BRYONY ROUGIER MRN: [id number] DOB: 1933-12-07 Today's Date: 05/13/2019    History of Present Illness Arine Turski  is a 85 y.o. female, with history of TIA, COPD requiring 2 L nasal cannula at night, anxiety and GERD with esophageal webs presents today for congestion.  Patient reports that 2 weeks ago she started saying she had upper respiratory congestion and then 4 days ago she said she wanted to be seen because she felt like it was moving to her chest.  It was the holiday weekend so they can go in then.  They went to urgent care this morning in urgent care noticed that her heart rate was elevated and oxygen saturations were decreased they advised patient to going to be seen by the ER.  Patient wanted come in so she hospitals could be done.  They tested her for COVID and sent her home telling her to use her supplemental oxygen during the day as well.  Patient has a pulse oximeter at home and she noticed that her heart rate was 137 and her oxygen saturation was 95% she called her daughter and they decided to come into the ED.  Patient has associated chronic fatigue since this congestion started.  Daughter reports patient will only get out of bed for an hour at a time and then go back to bed with the slightest amount of exertion exhausting her.  In the ED patient was saturating in the mid 90s on 2 L nasal cannula but upon ambulation she dropped to 69%.  She is also tachypneic in the mid 20s.  Heart rate was 120.  She was started on Solu-Medrol, doxycycline, given a 500 mL bolus and given albuterol in the ED.  COVID was negative.  Chest x-ray showed cardiomegaly, COPD, hiatal hernia, no acute active disease.  Tropes were stable x2.  Electrolytes were good.  Lactic acid was 1.1 blood cultures were drawn.  Patient is being admitted for the management of a COPD exacerbation.   Clinical Impression   Pt agreeable to OT evaluation, very pleasant this am.  During session pt O2 dropping to 87 upon standing, quickly returning to high 90s with PLB. Ambulated to restroom without O2, dropping to 89 with HR at 138, quickly returning to 91 and HR 113 with PLB with sitting on commode. Upon returning to chair O2 sats remaining >90 and HR in 80s, 2L O2 replaced. Pt performing ADLs with supervision, occasional verbal cuing for PLB. No further OT services required at this time.     Follow Up Recommendations  No OT follow up    Equipment Recommendations  None recommended by OT       Precautions / Restrictions Precautions Precautions: Fall Restrictions Weight Bearing Restrictions: No      Mobility Bed Mobility Overal bed mobility: Modified Independent                Transfers Overall transfer level: Modified independent Equipment used: Rolling walker (2 wheeled)                      ADL either performed or assessed with clinical judgement   ADL Overall ADL's : Needs assistance/impaired     Grooming: Wash/dry hands;Oral care;Supervision/safety;Standing               Lower Body Dressing: Modified independent;Sitting/lateral leans   Toilet Transfer: Supervision/safety;Ambulation;RW;Grab bars   Toileting- Clothing Manipulation and Hygiene: Supervision/safety;Sit to/from stand  Functional mobility during ADLs: Supervision/safety;Rolling walker       Vision Baseline Vision/History: Wears glasses Wears Glasses: At all times Patient Visual Report: No change from baseline Vision Assessment?: No apparent visual deficits            Pertinent Vitals/Pain Pain Assessment: No/denies pain     Hand Dominance Right   Extremity/Trunk Assessment Upper Extremity Assessment Upper Extremity Assessment: Overall WFL for tasks assessed(4/5 in BUE)   Lower Extremity Assessment Lower Extremity Assessment: Defer to PT evaluation   Cervical / Trunk Assessment Cervical / Trunk Assessment: Normal   Communication  Communication Communication: No difficulties   Cognition Arousal/Alertness: Awake/alert Behavior During Therapy: WFL for tasks assessed/performed Overall Cognitive Status: Within Functional Limits for tasks assessed                                                Home Living Family/patient expects to be discharged to:: Private residence Living Arrangements: Alone Available Help at Discharge: Family;Available PRN/intermittently Type of Home: Mobile home Home Access: Stairs to enter Entrance Stairs-Number of Steps: 4 Entrance Stairs-Rails: Right;Left Home Layout: One level     Bathroom Shower/Tub: Teacher, early years/pre: Standard     Home Equipment: Environmental consultant - 2 wheels;Cane - quad;Walker - 4 wheels;Shower seat;Toilet riser          Prior Functioning/Environment Level of Independence: Independent with assistive device(s)        Comments: Pt uses cane for functional mobility, independent with ADLs.         OT Problem List: Cardiopulmonary status limiting activity       End of Session Equipment Utilized During Treatment: Rolling walker;Oxygen Nurse Communication: Mobility status;Other (comment)(O2 sats)  Activity Tolerance: Patient tolerated treatment well Patient left: in chair;with call bell/phone within reach;with chair alarm set  OT Visit Diagnosis: Muscle weakness (generalized) (M62.81)                Time: [id number] OT Time Calculation (min): 38 min Charges:  OT General Charges $OT Visit: 1 Visit OT Evaluation $OT Eval Low Complexity: [place], OTR/L  [phone] 05/13/2019, 8:45 AM

## 2019-05-13 NOTE — Care Management (Signed)
Home health agencies that serve [phone].      . A rating of 4 or more stars means the agency performed better than most other agencies on selected measures. . A rating of 3 to 3 stars means that the agency performed about the same as most agencies. . A rating of fewer than 3 stars means that the agency's performance was below the average of other agencies on selected measures." class="info" href="javascript:void(0)". A rating of 4 or more stars means the agency performed better than most other agencies on selected measures. . A rating of 3 to 3 stars means that the agency performed about the same as most agencies. . A rating of fewer than 3 stars means that the agency's performance was below the average of other agencies on selected measures." class="info" href="javascript:void(0)"      Home Health Agency InformationSorted ascending, Select to sort descending  Quality of Patient Care RatingSelect to sort ascending or descending . A rating of 4 or more stars means the agency performed better than most other agencies on selected measures. . A rating of 3 to 3 stars means that the agency performed about the same as most agencies. . A rating of fewer than 3 stars means that the agency's performance was below the average of other agencies on selected measures." class="info" href="javascript:void(0)". A rating of 4 or more stars means the agency performed better than most other agencies on selected measures. . A rating of 3 to 3 stars means that the agency performed about the same as most agencies. . A rating of fewer than 3 stars means that the agency's performance was below the average of other agencies on selected measures." class="info" href="javascript:void(0)"  Patient Survey Summary RatingSelect to sort ascending or descending    [facility] [phone]   [place] to my Favorites  Quality of Patient Care Rating4 out of 5 stars Patient Survey Summary Rating4 out of [place] [phone]   [place] to my Favorites  Quality of Patient Care Rating3 out of 5 stars Patient Survey Summary Rating5 out of [place] [phone]   [place] to my Favorites  Quality of Patient Care Rating3 out of 5 stars Patient Survey Summary Rating4 out of [place] [phone]   [place] to my [place]  out of 5 stars Patient Survey Summary Rating4 out of [place] [phone]   [place] to my [place]  out of 5 stars Patient Survey Summary Rating3 out of [place] [phone]   [place] to my Favorites  Quality of Patient Care Rating4 out of 5 stars Patient Survey Summary Rating4 out of [place] [phone]   [place] to my Favorites  Quality of Patient Care Rating4 out of 5 stars Patient Survey Summary Rating4 out of [place] [phone]   [place] to my Favorites  Quality of Patient Care Rating4 out of 5 stars Patient Survey Summary Rating4 out of [place] AGE [phone]   [place] AGE to my Favorites  Quality  of Patient Care Rating3 out of 5 stars Patient Survey Summary Rating3 out of 5 stars  [facility] [place] [phone]   [place] to my Favorites  Quality of Patient Care Rating3  out of 5 stars Patient Survey Summary Rating4 out of 5 stars  [place] [phone]   [place] to my Favorites  Quality of Patient Care Rating3 out of 5 stars Patient Survey Summary Rating4 out of 5 stars  [facility] (336) [phone]   Add  [facility] to my Favorites  Quality of Patient Care Rating3  out of 5 stars Patient Survey Summary [place] out of [place] [phone]   Add [facility] to my Favorites  Quality of Patient Care Rating3  out of 5 stars Not [place] (336) [place] to my Favorites  Quality of Patient [place]  out of 5 stars

## 2019-05-13 NOTE — H&P (Signed)
TRH H&P    Patient Demographics:    Taylor Cannon, is a 85 y.o. female  MRN: [id number]  DOB - Apr 14, 1934  Admit Date - 05/12/2019  Referring MD/NP/PA: Dr. Wyvonnia Dusky  Outpatient Primary MD for the patient is Elyn Aquas  Patient coming from: Home   Chief complaint-congestion   HPI:    Taylor Cannon  is a 85 y.o. female, with history of TIA, COPD requiring 2 L nasal cannula at night, anxiety and GERD with esophageal webs presents today for congestion.  Patient reports that 2 weeks ago she started saying she had upper respiratory congestion and then 4 days ago she said she wanted to be seen because she felt like it was moving to her chest.  It was the holiday weekend so they can go in then.  They went to urgent care this morning in urgent care noticed that her heart rate was elevated and oxygen saturations were decreased they advised patient to going to be seen by the ER.  Patient wanted come in so she hospitals could be done.  They tested her for COVID and sent her home telling her to use her supplemental oxygen during the day as well.  Patient has a pulse oximeter at home and she noticed that her heart rate was 137 and her oxygen saturation was 95% she called her daughter and they decided to come into the ED.  Patient has associated chronic fatigue since this congestion started.  Daughter reports patient will only get out of bed for an hour at a time and then go back to bed with the slightest amount of exertion exhausting her.  In the ED patient was saturating in the mid 90s on 2 L nasal cannula but upon ambulation she dropped to 69%.  She is also tachypneic in the mid 20s.  Heart rate was 120.  She was started on Solu-Medrol, doxycycline, given a 500 mL bolus and given albuterol in the ED.  COVID was negative.  Chest x-ray showed cardiomegaly, COPD, hiatal hernia, no acute active disease.  Tropes were stable x2.   Electrolytes were good.  Lactic acid was 1.1 blood cultures were drawn.  Patient is being admitted for the management of a COPD exacerbation.    Review of systems:    In addition to the HPI above,  No Fever-chills, No Headache, No changes with Vision or hearing, Chronic difficulty swallowing food with esophageal webs requiring dilation by Dr. Melony Overly, No Chest pain, Cough or endorses congestion and shortness of Breath, No Abdominal pain, No Nausea or Vomiting, bowel movements are regular, endorses loss of appetite No Blood in stool or Urine, No dysuria, No new skin rashes or bruises, No new joints pains-aches,  No new weakness, tingling, numbness in any extremity, No recent weight gain or loss, No polyuria, polydypsia or polyphagia, No significant Mental Stressors.  All other systems reviewed and are negative.    Past History of the following :    Past Medical History:  Diagnosis Date   Anxiety    COPD (chronic obstructive pulmonary  disease) ([place])    Depression    GERD (gastroesophageal reflux disease)    On home oxygen therapy    2L via [place] at night   TIA (transient ischemic attack)       Past Surgical History:  Procedure Laterality Date   APPENDECTOMY     BALLOON DILATION N/A 07/24/2013   Procedure: BALLOON DILATION to 13.66mm;  Surgeon: Rogene Houston, MD;  Location: [facility];  Service: Endoscopy;  Laterality: N/A;   BALLOON DILATION N/A 11/11/2014   Procedure: BALLOON DILATION;  Surgeon: Rogene Houston, MD;  [facility];  Service: Endoscopy;  Laterality: N/A;   CARDIAC ELECTROPHYSIOLOGY STUDY AND ABLATION     CATARACT EXTRACTION Bilateral    CERVICAL CONE BIOPSY     ESOPHAGEAL DILATION N/A 11/30/2014   Procedure: ESOPHAGEAL DILATION;  Surgeon: Rogene Houston, MD;  Location: [facility];  Service: Endoscopy;  Laterality: N/A;  Balloon, 15, 16.5   ESOPHAGEAL DILATION N/A 06/03/2015   Procedure: ESOPHAGEAL DILATION WITH 16.5 MM BALLOON ;  Surgeon:  Rogene Houston, MD;  Location: [facility];  Service: Endoscopy;  Laterality: N/A;   ESOPHAGEAL DILATION N/A 09/09/2015   Procedure: ESOPHAGEAL DILATION;  Surgeon: Rogene Houston, MD;  [facility];  Service: Endoscopy;  Laterality: N/A;   ESOPHAGEAL DILATION N/A 05/04/2016   Procedure: ESOPHAGEAL DILATION;  Surgeon: Rogene Houston, MD;  [facility];  Service: Endoscopy;  Laterality: N/A;   ESOPHAGEAL DILATION N/A 06/22/2016   Procedure: ESOPHAGEAL DILATION;  Surgeon: Rogene Houston, MD;  [facility];  Service: Endoscopy;  Laterality: N/A;   ESOPHAGOGASTRODUODENOSCOPY N/A 11/11/2014   Procedure: ESOPHAGOGASTRODUODENOSCOPY (EGD);  Surgeon: Rogene Houston, MD;  [facility];  Service: Endoscopy;  Laterality: N/A;  125-rescheduled 11/11/14 @ 10:30am Ann notified pt   ESOPHAGOGASTRODUODENOSCOPY (EGD) WITH ESOPHAGEAL DILATION N/A 08/20/2013   Procedure: ESOPHAGOGASTRODUODENOSCOPY (EGD) WITH ESOPHAGEAL DILATION;  Surgeon: Rogene Houston, MD;  [facility];  Service: Endoscopy;  Laterality: N/A;  345-moved to 730 Ann notified pt   ESOPHAGOGASTRODUODENOSCOPY (EGD) WITH PROPOFOL N/A 07/24/2013   Procedure: ESOPHAGOGASTRODUODENOSCOPY (EGD) WITH PROPOFOL UNDER FLUOROSCOPY;  Surgeon: Rogene Houston, MD;  [facility];  Service: Endoscopy;  Laterality: N/A;   ESOPHAGOGASTRODUODENOSCOPY (EGD) WITH PROPOFOL N/A 11/30/2014   Procedure: ESOPHAGOGASTRODUODENOSCOPY (EGD) WITH PROPOFOL;  Surgeon: Rogene Houston, MD;  [facility]: [facility];  Service: Endoscopy;  Laterality: N/A;  fluoro not needed   ESOPHAGOGASTRODUODENOSCOPY (EGD) WITH PROPOFOL N/A 06/03/2015   Procedure: ESOPHAGOGASTRODUODENOSCOPY (EGD) WITH PROPOFOL; HIATUS AT 35 CM AND GE JUNCTION 40 CM;  Surgeon: Rogene Houston, MD;  Location[facility];  Service: Endoscopy;  Laterality: N/A;   ESOPHAGOGASTRODUODENOSCOPY (EGD) WITH PROPOFOL N/A 09/09/2015   Procedure: ESOPHAGOGASTRODUODENOSCOPY (EGD) WITH  PROPOFOL;  Surgeon: Rogene Houston, MD;  [facility];  Service: Endoscopy;  Laterality: N/A;  8:15   ESOPHAGOGASTRODUODENOSCOPY (EGD) WITH PROPOFOL N/A 05/04/2016   Procedure: ESOPHAGOGASTRODUODENOSCOPY (EGD) WITH PROPOFOL;  Surgeon: Rogene Houston, MD;  [facility];  Service: Endoscopy;  Laterality: N/A;  9:30   ESOPHAGOGASTRODUODENOSCOPY (EGD) WITH PROPOFOL N/A 06/22/2016   Procedure: ESOPHAGOGASTRODUODENOSCOPY (EGD) WITH PROPOFOL;  Surgeon: Rogene Houston, MD;  [facility];  Service: Endoscopy;  Laterality: N/A;  11:20   INTRAMEDULLARY (IM) NAIL INTERTROCHANTERIC Left 09/18/2015   Procedure: INTRAMEDULLARY (IM) NAIL INTERTROCHANTRIC;  Surgeon: Carole Civil, MD;  Location: [facility];  Service: Orthopedics;  Laterality: Left;   TUBAL  LIGATION        Social History:      Social History   Tobacco Use   Smoking status: Current Every Day Smoker    Packs/day: 1.00    Years: 55.00    Pack years: 55.00    Types: Cigarettes   Smokeless tobacco: Never Used   Tobacco comment: quit smoking since 1st of July.  Smoked all her life.   Substance Use Topics   Alcohol use: Yes    Comment: 2 shots at night       Family History :    No family history on file. Cancer and hypertension run in the family   Home Medications:   Prior to Admission medications   Medication Sig Start Date End Date Taking? Authorizing Provider  allopurinol (ZYLOPRIM) 100 MG tablet Take 1 tablet by mouth daily. 05/20/18   [provider]  ALPRAZolam Duanne Moron) 0.25 MG tablet Take 1 tablet (0.25 mg total) by mouth 2 (two) times daily as needed for anxiety. Patient taking differently: Take 0.125-0.25 mg by mouth 2 (two) times daily as needed for anxiety or sleep.  09/20/15   Kathie Dike, MD  amitriptyline (ELAVIL) 50 MG tablet Take 50 mg by mouth at bedtime. 10/02/17   [provider]  calcium carbonate (OSCAL) 1500 (600 Ca) MG TABS tablet Take 600 mg of  elemental calcium by mouth daily.    [provider]  cyclobenzaprine (FLEXERIL) 5 MG tablet Take 1 tablet (5 mg total) by mouth 3 (three) times daily as needed for muscle spasms. 04/12/19   Nat Christen, MD  mirtazapine (REMERON SOL-TAB) 30 MG disintegrating tablet Take 1 tablet by mouth daily. 05/20/18   [provider]  omeprazole (PRILOSEC) 40 MG capsule Take 1 capsule by mouth daily. 03/11/18   [provider]  OXYGEN Inhale 2 L into the lungs at bedtime.    [provider]  senna-docusate (SENOKOT-S) 8.6-50 MG tablet Take 1 tablet by mouth daily as needed for mild constipation. 10/07/17   Carole Civil, MD  traMADol (ULTRAM) 50 MG tablet Take 1 tablet (50 mg total) by mouth every 6 (six) hours as needed. 10/07/17   Carole Civil, MD  vitamin C (ASCORBIC ACID) 500 MG tablet Take 500 mg by mouth daily.    [provider]     Allergies:     Allergies  Allergen Reactions   Other Nausea Only    Steroids   Oxycodone Nausea And Vomiting     Physical Exam:   Vitals  Blood pressure 136/86, pulse (!) 111, temperature 98 F (36.7 C), temperature source Oral, resp. rate (!) 23, height 5\' 8"  (1.727 m), weight 38 kg, SpO2 100 %.  1.  General: Patient lying supine in bed with head of bed elevated in no acute distress  2. Psychiatric: Patient is alert and oriented x3 pleasant and cooperative with exam  3. Neurologic: Cranial nerves II through XII are grossly intact with no focal deficits on limited exam  4. HEENMT:  Neck is cachectic head is atraumatic normocephalic trachea is midline  5. Respiratory : Lungs are clear to auscultation  6. Cardiovascular : Heart rate is tachycardic, rhythm is regular  7. Gastrointestinal:  Abdomen is soft nondistended nontender to palpation  8. Skin:  Skin shows multiple seborrheic keratoses and sun spots  9.Musculoskeletal:  No peripheral edema    Data Review:    CBC Recent Labs  Lab  05/12/19 1939  WBC 9.6  HGB 14.6  HCT 46.2*  PLT 377  MCV 97.7  MCH 30.9  MCHC 31.6  RDW 15.4  LYMPHSABS 0.8  MONOABS 0.7  EOSABS 0.2  BASOSABS 0.0   ------------------------------------------------------------------------------------------------------------------  Results for orders placed or performed during the hospital encounter of 05/12/19 (from the past 48 hour(s))  CBC with Differential     Status: Abnormal   Collection Time: 05/12/19  7:39 PM  Result Value Ref Range   WBC 9.6 4.0 - 10.5 K/uL   RBC 4.73 3.87 - 5.11 MIL/uL   Hemoglobin 14.6 12.0 - 15.0 g/dL   HCT 46.2 (H) 36.0 - 46.0 %   MCV 97.7 80.0 - 100.0 fL   MCH 30.9 26.0 - 34.0 pg   MCHC 31.6 30.0 - 36.0 g/dL   RDW 15.4 11.5 - 15.5 %   Platelets 377 150 - 400 K/uL   nRBC 0.0 0.0 - 0.2 %   Neutrophils Relative % 82 %   Neutro Abs 7.9 (H) 1.7 - 7.7 K/uL   Lymphocytes Relative 8 %   Lymphs Abs 0.8 0.7 - 4.0 K/uL   Monocytes Relative 7 %   Monocytes Absolute 0.7 0.1 - 1.0 K/uL   Eosinophils Relative 2 %   Eosinophils Absolute 0.2 0.0 - 0.5 K/uL   Basophils Relative 0 %   Basophils Absolute 0.0 0.0 - 0.1 K/uL   Immature Granulocytes 1 %   Abs Immature Granulocytes 0.06 0.00 - 0.07 K/uL    Comment: Performed at [facility], [address], [place], [place] [id number]  Basic metabolic panel     Status: Abnormal   Collection Time: 05/12/19  7:39 PM  Result Value Ref Range   Sodium 134 (L) 135 - 145 mmol/L   Potassium 4.1 3.5 - 5.1 mmol/L   Chloride 96 (L) 98 - 111 mmol/L   CO2 30 22 - 32 mmol/L   Glucose, Bld 105 (H) 70 - 99 mg/dL   BUN 10 8 - 23 mg/dL   Creatinine, Ser 0.70 0.44 - 1.00 mg/dL   Calcium 8.7 (L) 8.9 - 10.3 mg/dL   GFR calc non Af Amer >60 >60 mL/min   GFR calc Af Amer >60 >60 mL/min   Anion gap 8 5 - 15    Comment: Performed at [facility], [address] [place], [place] [address]  Troponin I (High Sensitivity)     Status: None   Collection Time: 05/12/19  7:39 PM  Result Value Ref  Range   Troponin I (High Sensitivity) 4 <18 ng/L    Comment: (NOTE) Elevated high sensitivity troponin I (hsTnI) values and significant  changes across serial measurements may suggest ACS but many other  chronic and acute conditions are known to elevate hsTnI results.  Refer to the "Links" section for chest pain algorithms and additional  guidance. Performed at [facility], [address], [place], [place] [address]   Brain natriuretic peptide     Status: None   Collection Time: 05/12/19  7:39 PM  Result Value Ref Range   B Natriuretic Peptide 69.0 0.0 - 100.0 pg/mL    Comment: Performed at [facility], [address], [place], [place] [address]  D-dimer, quantitative (not at [facility])     Status: None   Collection Time: 05/12/19  7:39 PM  Result Value Ref Range   D-Dimer, Quant 0.28 0.00 - 0.50 ug/mL-FEU    Comment: (NOTE) At the manufacturer cut-off of 0.50 ug/mL FEU, this assay has been documented to exclude PE with a sensitivity and  negative predictive value of 97 to 99%.  At this time, this assay has not been approved by the FDA to exclude DVT/VTE. Results should be correlated with clinical presentation. Performed at [facility], [address] [place], [place] [address]   Lactic acid, plasma     Status: None   Collection Time: 05/12/19  7:39 PM  Result Value Ref Range   Lactic Acid, Venous 1.4 0.5 - 1.9 mmol/L    Comment: Performed at [facility], [address], [place], [place] [address]  Troponin I (High Sensitivity)     Status: None   Collection Time: 05/12/19  9:32 PM  Result Value Ref Range   Troponin I (High Sensitivity) 4 <18 ng/L    Comment: (NOTE) Elevated high sensitivity troponin I (hsTnI) values and significant  changes across serial measurements may suggest ACS but many other  chronic and acute conditions are known to elevate hsTnI results.  Refer to the "Links" section for chest pain algorithms and additional  guidance. Performed at [facility], [address], [place], [place] [address]   Lactic acid, plasma     Status: None   Collection Time: 05/12/19 11:30 PM  Result Value Ref Range   Lactic Acid, Venous 1.1 0.5 - 1.9 mmol/L    Comment: Performed at [facility], [address] [place], [place] [address]  SARS Coronavirus 2 [facility] order, Performed in [facility] hospital lab) Nasopharyngeal Nasopharyngeal Swab     Status: None   Collection Time: 05/12/19 11:33 PM   Specimen: Nasopharyngeal Swab  Result Value Ref Range   SARS Coronavirus 2 NEGATIVE NEGATIVE    Comment: (NOTE) If result is NEGATIVE SARS-CoV-2 target nucleic acids are NOT DETECTED. The SARS-CoV-2 RNA is generally detectable in upper and lower  respiratory specimens during the acute phase of infection. The lowest  concentration of SARS-CoV-2 viral copies this assay can detect is 250  copies / mL. A negative result does not preclude SARS-CoV-2 infection  and should not be used as the sole basis for treatment or other  patient management decisions.  A negative result may occur with  improper specimen collection / handling, submission of specimen other  than nasopharyngeal swab, presence of viral mutation(s) within the  areas targeted by this assay, and inadequate number of viral copies  (<250 copies / mL). A negative result must be combined with clinical  observations, patient history, and epidemiological information. If result is POSITIVE SARS-CoV-2 target nucleic acids are DETECTED. The SARS-CoV-2 RNA is generally detectable in upper and lower  respiratory specimens dur ing the acute phase of infection.  Positive  results are indicative of active infection with SARS-CoV-2.  Clinical  correlation with patient history and other diagnostic information is  necessary to determine patient infection status.  Positive results do  not rule out bacterial infection or co-infection with other viruses. If result is PRESUMPTIVE POSTIVE SARS-CoV-2 nucleic acids MAY BE PRESENT.     A presumptive positive result was obtained on the submitted specimen  and confirmed on repeat testing.  While 2019 novel coronavirus  (SARS-CoV-2) nucleic acids may be present in the submitted sample  additional confirmatory testing may be necessary for epidemiological  and / or clinical management purposes  to differentiate between  SARS-CoV-2 and other Sarbecovirus currently known to infect humans.  If clinically indicated additional testing with an alternate test  methodology [phone]) is advised. The SARS-CoV-2 RNA is generally  detectable in upper and lower respiratory sp ecimens during the acute  phase of infection. The expected result is Negative. Fact Sheet for Patients:  StrictlyIdeas.no Fact Sheet for Healthcare Providers: BankingDealers.co.za This test is not yet approved or cleared by the [place] FDA and has been authorized for detection and/or diagnosis of SARS-CoV-2 by FDA under an Emergency Use Authorization (EUA).  This EUA will remain in effect (meaning this test can be used) for the duration of the COVID-19 declaration under Section 564(b)(1) of the Act, 21 U.S.C. section 360bbb-3(b)(1), unless the authorization is terminated or revoked sooner. Performed at [facility], [address] [place], [place] [address]     Chemistries  Recent Labs  Lab 05/12/19 1939  NA 134*  K 4.1  CL 96*  CO2 30  GLUCOSE 105*  BUN 10  CREATININE 0.70  CALCIUM 8.7*   ------------------------------------------------------------------------------------------------------------------  ------------------------------------------------------------------------------------------------------------------ GFR: Estimated Creatinine Clearance: 32 mL/min (by C-G formula based on SCr of 0.7 mg/dL). Liver Function Tests: No results for input(s): AST, ALT, ALKPHOS, BILITOT, PROT, ALBUMIN in the last 168 hours. No results for input(s):  LIPASE, AMYLASE in the last 168 hours. No results for input(s): AMMONIA in the last 168 hours. Coagulation Profile: No results for input(s): INR, PROTIME in the last 168 hours. Cardiac Enzymes: No results for input(s): CKTOTAL, CKMB, CKMBINDEX, TROPONINI in the last 168 hours. BNP (last 3 results) No results for input(s): PROBNP in the last 8760 hours. HbA1C: No results for input(s): HGBA1C in the last 72 hours. CBG: No results for input(s): GLUCAP in the last 168 hours. Lipid Profile: No results for input(s): CHOL, HDL, LDLCALC, TRIG, CHOLHDL, LDLDIRECT in the last 72 hours. Thyroid Function Tests: No results for input(s): TSH, T4TOTAL, FREET4, T3FREE, THYROIDAB in the last 72 hours. Anemia Panel: No results for input(s): VITAMINB12, FOLATE, FERRITIN, TIBC, IRON, RETICCTPCT in the last 72 hours.  --------------------------------------------------------------------------------------------------------------- Urine analysis:    Component Value Date/Time   COLORURINE YELLOW 09/16/2015 0930   APPEARANCEUR CLEAR 09/16/2015 0930   LABSPEC <1.005 (L) 09/16/2015 0930   PHURINE 5.5 09/16/2015 0930   GLUCOSEU NEGATIVE 09/16/2015 0930   HGBUR NEGATIVE 09/16/2015 0930   BILIRUBINUR NEGATIVE 09/16/2015 0930   KETONESUR NEGATIVE 09/16/2015 0930   PROTEINUR NEGATIVE 09/16/2015 0930   NITRITE NEGATIVE 09/16/2015 0930   LEUKOCYTESUR NEGATIVE 09/16/2015 0930      Imaging Results:    Dg Chest Portable 1 View  Result Date: 05/12/2019 CLINICAL DATA:  Shortness of breath EXAM: PORTABLE CHEST 1 VIEW COMPARISON:  04/12/2019 FINDINGS: There is hyperinflation of the lungs compatible with COPD. Cardiomegaly. Moderate-sized hiatal hernia. No confluent opacities or effusions. No acute bony abnormality. IMPRESSION: Cardiomegaly, COPD. Hiatal hernia. No active disease. Electronically Signed   By: Rolm Baptise M.D.   On: 05/12/2019 23:35    My personal review of EKG: Rhythm NSR, Rate 116/min, QTc 444,no  Acute ST changes   Assessment & Plan:    Active Problems:   COPD exacerbation ([place])   1. COPD exacerbation 1. Continue PO steroids 2. Continue antibiotic treatment with Rocephin 3. Continue respiratory optimizations with SABA PRN, and combo Laba/steroid 4. Continue Pulse oximetry checks with vitals 5. Continue oxygen supplementation 2. Hypomagnesemia 1. ER replaced 2g Mag sulfate. 2. Recheck in the morning 3. Malnutrition with BMI of 12.74 1. Encourage PO intake 2. Patient declines soft textured foods 3. Add Protein shakes between meals 4. Anxiety 1. Continue xanax 5. Tobacco abuse 1. Nicoderm Patch 2. Extensive counseling re: smoking cessation  DVT Prophylaxis-   Lovenox - SCDs   AM Labs Ordered,  also please review Full Orders  Family Communication: Admission, patients condition and plan of care including tests being ordered have been discussed with the patient and  Daughter who indicate understanding and agree with the plan and Code Status.  Code Status:  Full  Admission status:Inpatient: Based on patients clinical presentation and evaluation of above clinical data, I have made determination that patient meets Inpatient criteria at this time.  Time spent in minutes : 65   Rolla Plate M.D on 05/13/2019 at 2:18 AM

## 2019-05-14 ENCOUNTER — Inpatient Hospital Stay (HOSPITAL_COMMUNITY): Payer: Medicare HMO

## 2019-05-14 DIAGNOSIS — I361 Nonrheumatic tricuspid (valve) insufficiency: Secondary | ICD-10-CM

## 2019-05-14 LAB — BASIC METABOLIC PANEL
Anion gap: 6 (ref 5–15)
BUN: 12 mg/dL (ref 8–23)
CO2: 31 mmol/L (ref 22–32)
Calcium: 8.7 mg/dL — ABNORMAL LOW (ref 8.9–10.3)
Chloride: 101 mmol/L (ref 98–111)
Creatinine, Ser: 0.63 mg/dL (ref 0.44–1.00)
GFR calc Af Amer: >60 mL/min (ref >60)
GFR calc non Af Amer: >60 mL/min (ref >60)
Glucose, Bld: 83 mg/dL (ref 70–99)
Potassium: 4.4 mmol/L (ref 3.5–5.1)
Sodium: 138 mmol/L (ref 135–145)

## 2019-05-14 LAB — CBC
HCT: 38.5 % (ref 36.0–46.0)
Hemoglobin: 11.9 g/dL — ABNORMAL LOW (ref 12.0–15.0)
MCH: 30.6 pg (ref 26.0–34.0)
MCHC: 30.9 g/dL (ref 30.0–36.0)
MCV: 99 fL (ref 80.0–100.0)
Platelets: 302 10*3/uL (ref 150–400)
RBC: 3.89 MIL/uL (ref 3.87–5.11)
RDW: 15.8 % — ABNORMAL HIGH (ref 11.5–15.5)
WBC: 7.1 10*3/uL (ref 4.0–10.5)
nRBC: 0 % (ref 0.0–0.2)

## 2019-05-14 LAB — ECHOCARDIOGRAM COMPLETE
Height: 68 in
Weight: 1340.4 oz

## 2019-05-14 LAB — MAGNESIUM: Magnesium: 1.6 mg/dL — ABNORMAL LOW (ref 1.7–2.4)

## 2019-05-14 LAB — HIV ANTIBODY (ROUTINE TESTING W REFLEX): HIV Screen 4th Generation wRfx: NONREACTIVE

## 2019-05-14 MED ORDER — METOPROLOL TARTRATE 25 MG PO TABS
25.0000 mg | ORAL_TABLET | Freq: Two times a day (BID) | ORAL | Status: DC
  Administered 2019-05-14 (×2): 25 mg via ORAL
  Filled 2019-05-14 (×2): qty 1

## 2019-05-14 MED ORDER — MAGNESIUM SULFATE 2 GM/50ML IV SOLN
2.0000 g | Freq: Once | INTRAVENOUS | Status: AC
  Administered 2019-05-14: 2 g via INTRAVENOUS
  Filled 2019-05-14: qty 50

## 2019-05-14 NOTE — Progress Notes (Signed)
PROGRESS NOTE    Taylor Cannon  [id number] DOB: [birth year] DOA: 05/12/2019 PCP: Taylor Cannon   Brief Narrative:  Per HPI: Taylor Cannon a46 y.o.female,with history of TIA,COPD requiring 2 L nasal cannula at night,anxiety and GERD with esophageal webs presents today for congestion. Patient reports that 2 weeks ago she started saying she had upper respiratory congestion and then 4 days ago she said she wanted to be seen because she felt like it was moving to her chest. It was the holiday weekend so they can go in then. They went to urgent care this morning in urgent care noticed that her heart rate was elevated and oxygen saturations were decreased they advised patient to going to be seen by the ER. Patient wanted come in so she hospitals could be done. They tested her for COVID and sent her home telling her to use her supplemental oxygen during the day as well.Patient has a pulse oximeter at home and she noticed that her heart rate was 137 and her oxygen saturation was 95% she called her Taylor Cannon and they decided to come into the ED. Patient has associated chronic fatigue since this congestion started. Taylor Cannon reports patient will only get out of bed for an hour at a time and then go back to bed with the slightest amount of exertion exhausting her. In the ED patient was saturating in the mid 90s on 2 L nasal cannula but upon ambulation she dropped to 69%. She is also tachypneic in the mid 20s. Heart rate was 120. She was started on Solu-Medrol, doxycycline, given a 500 mL bolus and given albuterol in the ED. COVID was negative. Chest x-ray showed cardiomegaly, COPD, hiatal hernia, no acute active disease. Tropes were stable x2. Electrolytes were good. Lactic acid was 1.1 blood cultures were drawn. Patient is being admitted for the management of a COPD exacerbation.  9/10: Patient noted to be symptomatically improved this morning and remains on 2 L nasal cannula.  PT  recommending home health.  Discussed case with GI, Dr.Rehman recommends barium swallow to further evaluate dysphagia and will consider EGD with dilation as needed.  2D echocardiogram performed today with results pending.  Continues to remain somewhat tachycardic.  Assessment & Plan:   Active Problems:   COPD exacerbation (HCC)   Acute on chronic COPD exacerbation with chronic hypoxemia -Continue current prednisone -Continue empiric Rocephin -Continue breathing treatments as needed -Continue current oxygen supplementation  Hypomagnesemia -Continue to replete and recheck in a.m.  Ongoing sinus tachycardia-asymptomatic -2D echocardiogram performed and pending -TSH noted to be 2.48 -Metoprolol increased to 25 mg twice daily today  Malnutrition with low BMI -Appreciate nutrition assessment -May require appetite stimulant in the outpatient setting  Dysphagia -Barium swallow ordered and discussed case with GI -May require endoscopy with esophageal dilation in a.m. if testing is positive  Anxiety -Continue Xanax  Tobacco abuse -Continue NicoDerm patch -Counseled on smoking cessation    DVT prophylaxis: Lovenox Code Status: Full Family Communication: Taylor Cannon on phone Disposition Plan: Continue treatment for COPD as noted with 2D echocardiogram results and swallow evaluation pending.  Anticipate discharge in the next 24 hours if stable and improved.   Consultants:   GI on phone  Procedures:   None  Antimicrobials:  Anti-infectives (From admission, onward)   Start     Dose/Rate Route Frequency Ordered Stop   05/13/19 0300  cefTRIAXone (ROCEPHIN) 1 g in sodium chloride 0.9 % 100 mL IVPB     1 g 200 mL/hr over 30 Minutes  Intravenous Every 24 hours 05/13/19 0252 05/18/19 0259   05/13/19 0100  doxycycline (VIBRA-TABS) tablet 100 mg     100 mg Oral  Once 05/13/19 0055 05/13/19 0105       Subjective: Patient seen and evaluated today with no new acute complaints or  concerns. No acute concerns or events noted overnight.  She denies any significant shortness of breath and appears to be having breakfast with no difficulty swallowing.  Discussed case with Taylor Cannon who is concerned about patient losing weight and not having much appetite.  Objective: Vitals:   05/14/19 0827 05/14/19 0828 05/14/19 0944 05/14/19 0950  BP:   127/77   Pulse:      Resp:      Temp:      TempSrc:      SpO2: 98% 98%  95%  Weight:      Height:        Intake/Output Summary (Last 24 hours) at 05/14/2019 1529 Last data filed at 05/14/2019 1300 Gross per 24 hour  Intake 820 ml  Output -  Net 820 ml   Filed Weights   05/12/19 1826  Weight: 38 kg    Examination:  General exam: Appears calm and comfortable  Respiratory system: Clear to auscultation. Respiratory effort normal.  Currently on 2 L nasal cannula. Cardiovascular system: S1 & S2 heard, RRR. No JVD, murmurs, rubs, gallops or clicks. No pedal edema. Gastrointestinal system: Abdomen is nondistended, soft and nontender. No organomegaly or masses felt. Normal bowel sounds heard. Central nervous system: Alert and oriented. No focal neurological deficits. Extremities: Symmetric 5 x 5 power. Skin: No rashes, lesions or ulcers Psychiatry: Judgement and insight appear normal. Mood & affect appropriate.     Data Reviewed: I have personally reviewed following labs and imaging studies  CBC: Recent Labs  Lab 05/12/19 1939 05/13/19 0511 05/14/19 0622  WBC 9.6 8.1 7.1  NEUTROABS 7.9*  --   --   HGB 14.6 12.6 11.9*  HCT 46.2* 40.3 38.5  MCV 97.7 99.3 99.0  PLT 377 271 [id number]   Basic Metabolic Panel: Recent Labs  Lab 05/12/19 1939 05/13/19 0511 05/14/19 0622  NA 134* 134* 138  K 4.1 3.4* 4.4  CL 96* 101 101  CO2 30 23 31   GLUCOSE 105* 204* 83  BUN 10 10 12   CREATININE 0.70 0.59 0.63  CALCIUM 8.7* 8.2* 8.7*  MG  --   --  1.6*   GFR: Estimated Creatinine Clearance: 32 mL/min (by C-G formula based on SCr of  0.63 mg/dL). Liver Function Tests: Recent Labs  Lab 05/13/19 0511  AST 37  ALT 21  ALKPHOS 67  BILITOT 0.4  PROT 5.3*  ALBUMIN 3.1*   No results for input(s): LIPASE, AMYLASE in the last 168 hours. No results for input(s): AMMONIA in the last 168 hours. Coagulation Profile: No results for input(s): INR, PROTIME in the last 168 hours. Cardiac Enzymes: No results for input(s): CKTOTAL, CKMB, CKMBINDEX, TROPONINI in the last 168 hours. BNP (last 3 results) No results for input(s): PROBNP in the last 8760 hours. HbA1C: No results for input(s): HGBA1C in the last 72 hours. CBG: No results for input(s): GLUCAP in the last 168 hours. Lipid Profile: No results for input(s): CHOL, HDL, LDLCALC, TRIG, CHOLHDL, LDLDIRECT in the last 72 hours. Thyroid Function Tests: Recent Labs    05/12/19 2134  TSH 2.482   Anemia Panel: No results for input(s): VITAMINB12, FOLATE, FERRITIN, TIBC, IRON, RETICCTPCT in the last 72 hours. Sepsis Labs: Recent  Labs  Lab 05/12/19 1939 05/12/19 2330  LATICACIDVEN 1.4 1.1    Recent Results (from the past 240 hour(s))  Blood culture (routine x 2)     Status: None (Preliminary result)   Collection Time: 05/12/19 11:28 PM   Specimen: BLOOD  Result Value Ref Range Status   Specimen Description BLOOD RIGHT ANTECUBITAL  Final   Special Requests   Final    BOTTLES DRAWN AEROBIC AND ANAEROBIC Blood Culture adequate volume   Culture   Final    NO GROWTH 2 DAYS Performed at [facility], [address], [place], [place] [address]    Report Status PENDING  Incomplete  Blood culture (routine x 2)     Status: None (Preliminary result)   Collection Time: 05/12/19 11:29 PM   Specimen: BLOOD  Result Value Ref Range Status   Specimen Description BLOOD LEFT ANTECUBITAL  Final   Special Requests   Final    BOTTLES DRAWN AEROBIC AND ANAEROBIC Blood Culture adequate volume   Culture   Final    NO GROWTH 2 DAYS Performed at [facility], [address]  [place], [place] [address]    Report Status PENDING  Incomplete  SARS Coronavirus 2 [facility] order, Performed in [place] hospital lab) Nasopharyngeal Nasopharyngeal Swab     Status: None   Collection Time: 05/12/19 11:33 PM   Specimen: Nasopharyngeal Swab  Result Value Ref Range Status   SARS Coronavirus 2 NEGATIVE NEGATIVE Final    Comment: (NOTE) If result is NEGATIVE SARS-CoV-2 target nucleic acids are NOT DETECTED. The SARS-CoV-2 RNA is generally detectable in upper and lower  respiratory specimens during the acute phase of infection. The lowest  concentration of SARS-CoV-2 viral copies this assay can detect is 250  copies / mL. A negative result does not preclude SARS-CoV-2 infection  and should not be used as the sole basis for treatment or other  patient management decisions.  A negative result may occur with  improper specimen collection / handling, submission of specimen other  than nasopharyngeal swab, presence of viral mutation(s) within the  areas targeted by this assay, and inadequate number of viral copies  (<250 copies / mL). A negative result must be combined with clinical  observations, patient history, and epidemiological information. If result is POSITIVE SARS-CoV-2 target nucleic acids are DETECTED. The SARS-CoV-2 RNA is generally detectable in upper and lower  respiratory specimens dur ing the acute phase of infection.  Positive  results are indicative of active infection with SARS-CoV-2.  Clinical  correlation with patient history and other diagnostic information is  necessary to determine patient infection status.  Positive results do  not rule out bacterial infection or co-infection with other viruses. If result is PRESUMPTIVE POSTIVE SARS-CoV-2 nucleic acids MAY BE PRESENT.   A presumptive positive result was obtained on the submitted specimen  and confirmed on repeat testing.  While 2019 novel coronavirus  (SARS-CoV-2) nucleic acids may be present in the  submitted sample  additional confirmatory testing may be necessary for epidemiological  and / or clinical management purposes  to differentiate between  SARS-CoV-2 and other Sarbecovirus currently known to infect humans.  If clinically indicated additional testing with an alternate test  methodology [phone]) is advised. The SARS-CoV-2 RNA is generally  detectable in upper and lower respiratory sp ecimens during the acute  phase of infection. The expected result is Negative. Fact Sheet for Patients:  StrictlyIdeas.no Fact Sheet for Healthcare Providers: BankingDealers.co.za This test is not yet approved or  cleared by the [place] and has been authorized for detection and/or diagnosis of SARS-CoV-2 by FDA under an Emergency Use Authorization (EUA).  This EUA will remain in effect (meaning this test can be used) for the duration of the COVID-19 declaration under Section 564(b)(1) of the Act, 21 U.S.C. section 360bbb-3(b)(1), unless the authorization is terminated or revoked sooner. Performed at [facility], [address] [place], [place] [address]          Radiology Studies: Dg Chest Portable 1 View  Result Date: 05/12/2019 CLINICAL DATA:  Shortness of breath EXAM: PORTABLE CHEST 1 VIEW COMPARISON:  04/12/2019 FINDINGS: There is hyperinflation of the lungs compatible with COPD. Cardiomegaly. Moderate-sized hiatal hernia. No confluent opacities or effusions. No acute bony abnormality. IMPRESSION: Cardiomegaly, COPD. Hiatal hernia. No active disease. Electronically Signed   By: Rolm Baptise M.D.   On: 05/12/2019 23:35        Scheduled Meds: . acetylcysteine  2 mL Nebulization TID  . allopurinol  100 mg Oral Daily  . amitriptyline  50 mg Oral QHS  . budesonide (PULMICORT) nebulizer solution  0.25 mg Nebulization BID  . enoxaparin (LOVENOX) injection  30 mg Subcutaneous Q24H  . feeding supplement (ENSURE ENLIVE)  237 mL  Oral BID BM  . metoprolol tartrate  25 mg Oral BID  . mometasone-formoterol  1 puff Inhalation BID  . multivitamin with minerals  1 tablet Oral Daily  . nicotine  14 mg Transdermal Daily  . pantoprazole  40 mg Oral Daily  . predniSONE  40 mg Oral Q breakfast   Continuous Infusions: . cefTRIAXone (ROCEPHIN)  IV 1 g (05/14/19 0200)     LOS: 1 day    Time spent: 30 minutes    Ascencion Stegner Darleen Crocker, DO [facility] Pager [phone]  If 7PM-7AM, please contact night-coverage [email or web address] Password [facility] 05/14/2019, 3:29 PM

## 2019-05-14 NOTE — Progress Notes (Signed)
Physical Therapy Treatment Patient Details Name: Taylor Cannon MRN: [id number] DOB: 1933-09-17 Today's Date: 05/14/2019    History of Present Illness Taylor Cannon  is a 85 y.o. female, with history of TIA, COPD requiring 2 L nasal cannula at night, anxiety and GERD with esophageal webs presents today for congestion.  Patient reports that 2 weeks ago she started saying she had upper respiratory congestion and then 4 days ago she said she wanted to be seen because she felt like it was moving to her chest.  It was the holiday weekend so they can go in then.  They went to urgent care this morning in urgent care noticed that her heart rate was elevated and oxygen saturations were decreased they advised patient to going to be seen by the ER.  Patient wanted come in so she hospitals could be done.  They tested her for COVID and sent her home telling her to use her supplemental oxygen during the day as well.  Patient has a pulse oximeter at home and she noticed that her heart rate was 137 and her oxygen saturation was 95% she called her daughter and they decided to come into the ED.  Patient has associated chronic fatigue since this congestion started.  Daughter reports patient will only get out of bed for an hour at a time and then go back to bed with the slightest amount of exertion exhausting her.  In the ED patient was saturating in the mid 90s on 2 L nasal cannula but upon ambulation she dropped to 69%.  She is also tachypneic in the mid 20s.  Heart rate was 120.  She was started on Solu-Medrol, doxycycline, given a 500 mL bolus and given albuterol in the ED.  COVID was negative.  Chest x-ray showed cardiomegaly, COPD, hiatal hernia, no acute active disease.  Tropes were stable x2.  Electrolytes were good.  Lactic acid was 1.1 blood cultures were drawn.  Patient is being admitted for the management of a COPD exacerbation.    PT Comments    Pt friendly and willing to participate with therapy.  Pt with O2  saturation at 97% room air at beginning of session so gait training complete initially without O2 A, did have portal O2 tank available.  Able to increased distance with gait to 103 ft with good sequence and no LOB, just slow cadence.  O2 saturation dropped to 86% on way back so returned to 2L O2 A with increased saturation to 95%.  EOS pt limited by fatigue.  No reports of pain through session.  Was left in chair with call bell within reach, chair alarm set and RN aware of status, O2 saturation levels as well as heart monitor that has come loose from chest.      Follow Up Recommendations  Home health PT;Supervision - Intermittent     Equipment Recommendations  None recommended by PT    Recommendations for Other Services       Precautions / Restrictions Precautions Precautions: Fall Restrictions Weight Bearing Restrictions: No    Mobility  Bed Mobility               General bed mobility comments: Pt sitting in chair upon entrance  Transfers Overall transfer level: Modified independent Equipment used: Quad cane             General transfer comment: safe mechanics.  5 STS for strengthening.  Increased time  Ambulation/Gait Ambulation/Gait assistance: Supervision Gait Distance (Feet): [place]  device: Quad cane Gait Pattern/deviations: Step-through pattern;Decreased step length - left;Decreased stance time - right;Decreased stride length Gait velocity: decreased   General Gait Details: Slow cadence, good sequence with QC, no LOB was limited by fatigue.  Began with room air with O2 saturation at 97%, decreased during gait so returned to 2L during gait wiht increased to 95% O2 saturation   Stairs             Wheelchair Mobility    Modified Rankin (Stroke Patients Only)       Balance                                            Cognition Arousal/Alertness: Awake/alert Behavior During Therapy: WFL for tasks  assessed/performed Overall Cognitive Status: Within Functional Limits for tasks assessed                                        Exercises      General Comments        Pertinent Vitals/Pain Pain Assessment: No/denies pain    Home Living                      Prior Function            PT Goals (current goals can now be found in the care plan section) Acute Rehab PT Goals Patient Stated Goal: return home with family to assist PT Goal Formulation: With patient Time For Goal Achievement: 05/15/19 Potential to Achieve Goals: Good Progress towards PT goals: Progressing toward goals    Frequency    Min 2X/week      PT Plan Current plan remains appropriate    Co-evaluation              AM-PAC PT "6 Clicks" Mobility   Outcome Measure  Help needed turning from your back to your side while in a flat bed without using bedrails?: None Help needed moving from lying on your back to sitting on the side of a flat bed without using bedrails?: None Help needed moving to and from a bed to a chair (including a wheelchair)?: None Help needed standing up from a chair using your arms (e.g., wheelchair or bedside chair)?: None Help needed to walk in hospital room?: A Little Help needed climbing 3-5 steps with a railing? : A Little 6 Click Score: 22    End of Session Equipment Utilized During Treatment: Oxygen;Gait belt Activity Tolerance: Patient tolerated treatment well;Patient limited by fatigue Patient left: in chair;with call bell/phone within reach;with chair alarm set Nurse Communication: Mobility status PT Visit Diagnosis: Unsteadiness on feet (R26.81);Other abnormalities of gait and mobility (R26.89);Muscle weakness (generalized) (M62.81)     Time: [id number] PT Time Calculation (min) (ACUTE ONLY): 25 min  Charges:  $Therapeutic Activity: 23-37 mins                     [address] LPTA; CBIS [phone]  Aldona Lento 05/14/2019, 9:55 AM

## 2019-05-14 NOTE — Progress Notes (Signed)
*  PRELIMINARY RESULTS* Echocardiogram 2D Echocardiogram has been performed.  Taylor Cannon 05/14/2019, 1:04 PM

## 2019-05-15 DIAGNOSIS — J441 Chronic obstructive pulmonary disease with (acute) exacerbation: Secondary | ICD-10-CM

## 2019-05-15 DIAGNOSIS — R1314 Dysphagia, pharyngoesophageal phase: Secondary | ICD-10-CM

## 2019-05-15 LAB — BASIC METABOLIC PANEL
Anion gap: 8 (ref 5–15)
BUN: 12 mg/dL (ref 8–23)
CO2: 28 mmol/L (ref 22–32)
Calcium: 8.7 mg/dL — ABNORMAL LOW (ref 8.9–10.3)
Chloride: 98 mmol/L (ref 98–111)
Creatinine, Ser: 0.56 mg/dL (ref 0.44–1.00)
GFR calc Af Amer: >60 mL/min (ref >60)
GFR calc non Af Amer: >60 mL/min (ref >60)
Glucose, Bld: 88 mg/dL (ref 70–99)
Potassium: 4.3 mmol/L (ref 3.5–5.1)
Sodium: 134 mmol/L — ABNORMAL LOW (ref 135–145)

## 2019-05-15 LAB — CBC
HCT: 36.8 % (ref 36.0–46.0)
Hemoglobin: 11.6 g/dL — ABNORMAL LOW (ref 12.0–15.0)
MCH: 30.9 pg (ref 26.0–34.0)
MCHC: 31.5 g/dL (ref 30.0–36.0)
MCV: 98.1 fL (ref 80.0–100.0)
Platelets: 293 10*3/uL (ref 150–400)
RBC: 3.75 MIL/uL — ABNORMAL LOW (ref 3.87–5.11)
RDW: 15.6 % — ABNORMAL HIGH (ref 11.5–15.5)
WBC: 6.4 10*3/uL (ref 4.0–10.5)
nRBC: 0 % (ref 0.0–0.2)

## 2019-05-15 LAB — MAGNESIUM: Magnesium: 1.8 mg/dL (ref 1.7–2.4)

## 2019-05-15 MED ORDER — MOMETASONE FURO-FORMOTEROL FUM 200-5 MCG/ACT IN AERO: 1.0000 | INHALATION_SPRAY | Freq: Two times a day (BID) | RESPIRATORY_TRACT | 1 refills | Status: DC

## 2019-05-15 MED ORDER — ALBUTEROL SULFATE HFA 108 (90 BASE) MCG/ACT IN AERS: 2.0000 | INHALATION_SPRAY | Freq: Four times a day (QID) | RESPIRATORY_TRACT | 3 refills | Status: DC | PRN

## 2019-05-15 MED ORDER — PREDNISONE 20 MG PO TABS: 40.0000 mg | ORAL_TABLET | Freq: Every day | ORAL | 0 refills | Status: AC

## 2019-05-15 MED ORDER — METOPROLOL TARTRATE 25 MG PO TABS: 25.0000 mg | ORAL_TABLET | Freq: Two times a day (BID) | ORAL | 1 refills | Status: DC

## 2019-05-15 NOTE — TOC Transition Note (Signed)
Transition of Care [facility]) - CM/SW Discharge Note   Patient Details  Name: Taylor Cannon MRN: [id number] Date of Birth: Jun 09, 1934  [facility] CM/SW Contact:  Boneta Lucks, RN Phone Number: 05/15/2019, 3:41 PM   Clinical Narrative:   Patient discharging today. New Referral to [place] for PT. Order has been placed. Taylor Cannon aware of discharge.  Patient used 02 at night.  New orders for continuous oxygen sent to [facility]. Called Patient she ask CM to call her daughter Taylor Cannon. Janell updated and has a tank to transport home at discharge.        Final next level of care: [place] Barriers to Discharge: No Barriers Identified   Patient Goals and CMS Choice Patient states their goals for this hospitalization and ongoing recovery are:: return home today, breathe easier CMS Medicare.gov Compare Post Acute Care list provided to:: Patient Choice offered to / list presented to : Patient, Adult Children  Discharge Placement    [place] for home oxygen   Discharge Plan and Services   Discharge Planning Services: CM Consult Post Acute Care Choice: [place]: PT [facility] Agency: [place] ([facility]) Date [place]: 05/13/19 Time [place]: 1215 Representative spoke with at [place]: [place] Determinants of Health ([place]) Interventions     Readmission Risk Interventions No flowsheet data found.

## 2019-05-15 NOTE — Care Management Important Message (Signed)
Important Message  Patient Details  Name: Taylor Cannon MRN: [id number] Date of Birth: September 10, 1933   Medicare Important Message Given:  Yes     Tommy Medal 05/15/2019, 12:53 PM

## 2019-05-15 NOTE — Consult Note (Signed)
Referring Provider: Heath Lark, DO Primary Care Physician:  Elyn Aquas Primary Gastroenterologist:  Dr. Laural Golden  Reason for Consultation:    Esophageal dysphagia.  HPI:   Patient is 85 year old Caucasian female with COPD chronic GERD known esophageal stricture with multiple dilations in the past most recently in October 2017 who was admitted to Dr. Trena Platt service 3 days ago and she is improving with therapy.  Chest film revealed changes of COPD and cardiomegaly but no pneumonia.  During this hospitalization patient has been complaining of difficulty in swallowing solids and also documented to have difficulty swallowing her pills.  Patient had barium pill esophagogram yesterday which revealed tertiary esophageal contractions distal esophageal stricture preventing passage of barium pill into the stomach and a large hiatal hernia.  Therefore GI consultation was requested. Patient states every time her esophagus has been dilated her swallowing has improved.  This time she has had difficulty swallowing for 7 to 8 months.  She just has been procrastinating even though her daughter has wanted to call our office.  She has poor appetite.  Her weight has fluctuated between 88 pounds and 92 pounds.  Patient states few years ago when she quit cigarette smoking her weight went up to 172 pounds.  She has not smoked since she came to the hospital and she is hoping she can keep doing so.  Her daughter [name] who is at bedside states her mother does not eat much.  He denies abdominal pain melena or rectal bleeding. Patient lives alone but when she is discharged from this facility she will be living with her daughter until she gets back on her feet.  Patient has undergone multiple dilations at this facility between February 2016 and October 2017.  First endoscopy in February 2016 revealed distal esophageal stricture measuring no more than 4 mm in diameter.  She required guidewire under fluoroscopy for esophageal  dilation.  Last dilation was accomplished with esophageal balloon and without fluoroscopy.   Past Medical History:  Diagnosis Date  . Anxiety   . COPD (chronic obstructive pulmonary disease) ([place])   . Depression   . GERD (gastroesophageal reflux disease)   . On home oxygen therapy    2L via [place] at night  . TIA (transient ischemic attack)     Past Surgical History:  Procedure Laterality Date  . APPENDECTOMY    . BALLOON DILATION N/A 07/24/2013   Procedure: BALLOON DILATION to 13.68mm;  Surgeon: Rogene Houston, MD;  Location: [facility];  Service: Endoscopy;  Laterality: N/A;  . BALLOON DILATION N/A 11/11/2014   Procedure: BALLOON DILATION;  Surgeon: Rogene Houston, MD;  Location: [facility];  Service: Endoscopy;  Laterality: N/A;  . CARDIAC ELECTROPHYSIOLOGY STUDY AND ABLATION    . CATARACT EXTRACTION Bilateral   . CERVICAL CONE BIOPSY    . ESOPHAGEAL DILATION N/A 11/30/2014   Procedure: ESOPHAGEAL DILATION;  Surgeon: Rogene Houston, MD;  Location: [facility];  Service: Endoscopy;  Laterality: N/A;  Balloon, 15, 16.5  . ESOPHAGEAL DILATION N/A 06/03/2015   Procedure: ESOPHAGEAL DILATION WITH 16.5 MM BALLOON ;  Surgeon: Rogene Houston, MD;  Location: [facility];  Service: Endoscopy;  Laterality: N/A;  . ESOPHAGEAL DILATION N/A 09/09/2015   Procedure: ESOPHAGEAL DILATION;  Surgeon: Rogene Houston, MD;  [facility];  Service: Endoscopy;  Laterality: N/A;  . ESOPHAGEAL DILATION N/A 05/04/2016   Procedure: ESOPHAGEAL DILATION;  Surgeon: Rogene Houston, MD;  [facility];  Service: Endoscopy;  Laterality:  N/A;  . ESOPHAGEAL DILATION N/A 06/22/2016   Procedure: ESOPHAGEAL DILATION;  Surgeon: Rogene Houston, MD;  [facility];  Service: Endoscopy;  Laterality: N/A;  . ESOPHAGOGASTRODUODENOSCOPY N/A 11/11/2014   Procedure: ESOPHAGOGASTRODUODENOSCOPY (EGD);  Surgeon: Rogene Houston, MD;  [facility]: [facility];  Service: Endoscopy;  Laterality: N/A;  125-rescheduled  11/11/14 @ 10:30am Ann notified pt  . ESOPHAGOGASTRODUODENOSCOPY (EGD) WITH ESOPHAGEAL DILATION N/A 08/20/2013   Procedure: ESOPHAGOGASTRODUODENOSCOPY (EGD) WITH ESOPHAGEAL DILATION;  Surgeon: Rogene Houston, MD;  [facility];  Service: Endoscopy;  Laterality: N/A;  345-moved to 730 Ann notified pt  . ESOPHAGOGASTRODUODENOSCOPY (EGD) WITH PROPOFOL N/A 07/24/2013   Procedure: ESOPHAGOGASTRODUODENOSCOPY (EGD) WITH PROPOFOL UNDER FLUOROSCOPY;  Surgeon: Rogene Houston, MD;  Location: [facility];  Service: Endoscopy;  Laterality: N/A;  . ESOPHAGOGASTRODUODENOSCOPY (EGD) WITH PROPOFOL N/A 11/30/2014   Procedure: ESOPHAGOGASTRODUODENOSCOPY (EGD) WITH PROPOFOL;  Surgeon: Rogene Houston, MD;  Location: [facility];  Service: Endoscopy;  Laterality: N/A;  fluoro not needed  . ESOPHAGOGASTRODUODENOSCOPY (EGD) WITH PROPOFOL N/A 06/03/2015   Procedure: ESOPHAGOGASTRODUODENOSCOPY (EGD) WITH PROPOFOL; HIATUS AT 35 CM AND GE JUNCTION 40 CM;  Surgeon: Rogene Houston, MD;  Location: [facility];  Service: Endoscopy;  Laterality: N/A;  . ESOPHAGOGASTRODUODENOSCOPY (EGD) WITH PROPOFOL N/A 09/09/2015   Procedure: ESOPHAGOGASTRODUODENOSCOPY (EGD) WITH PROPOFOL;  Surgeon: Rogene Houston, MD;  [facility];  Service: Endoscopy;  Laterality: N/A;  8:15  . ESOPHAGOGASTRODUODENOSCOPY (EGD) WITH PROPOFOL N/A 05/04/2016   Procedure: ESOPHAGOGASTRODUODENOSCOPY (EGD) WITH PROPOFOL;  Surgeon: Rogene Houston, MD;  [facility];  Service: Endoscopy;  Laterality: N/A;  9:30  . ESOPHAGOGASTRODUODENOSCOPY (EGD) WITH PROPOFOL N/A 06/22/2016   Procedure: ESOPHAGOGASTRODUODENOSCOPY (EGD) WITH PROPOFOL;  Surgeon: Rogene Houston, MD;  [facility];  Service: Endoscopy;  Laterality: N/A;  11:20  . INTRAMEDULLARY (IM) NAIL INTERTROCHANTERIC Left 09/18/2015   Procedure: INTRAMEDULLARY (IM) NAIL INTERTROCHANTRIC;  Surgeon: Carole Civil, MD;  Location: [facility];  Service: Orthopedics;  Laterality: Left;  . TUBAL  LIGATION      Prior to Admission medications   Medication Sig Start Date End Date Taking? Authorizing Provider  allopurinol (ZYLOPRIM) 100 MG tablet Take 1 tablet by mouth daily. 05/20/18  Yes [provider]  ALPRAZolam (XANAX) 0.25 MG tablet Take 1 tablet (0.25 mg total) by mouth 2 (two) times daily as needed for anxiety. Patient taking differently: Take 0.125-0.25 mg by mouth 3 (three) times daily as needed for anxiety or sleep.  09/20/15  Yes Kathie Dike, MD  amitriptyline (ELAVIL) 50 MG tablet Take 50 mg by mouth at bedtime. 10/02/17  Yes [provider]  mirtazapine (REMERON SOL-TAB) 30 MG disintegrating tablet Take 1 tablet by mouth daily. 05/20/18  Yes [provider]  Multiple Vitamin (MULTIVITAMIN PO) Take 1 tablet by mouth daily.   Yes [provider]  omeprazole (PRILOSEC) 40 MG capsule Take 1 capsule by mouth daily. 03/11/18  Yes [provider]  OXYGEN Inhale 2 L into the lungs at bedtime.   Yes [provider]  vitamin C (ASCORBIC ACID) 500 MG tablet Take 500 mg by mouth daily.   Yes [provider]  albuterol (VENTOLIN HFA) 108 (90 Base) MCG/ACT inhaler Inhale 2 puffs into the lungs every 6 (six) hours as needed for wheezing or shortness of breath. 05/15/19   Manuella Ghazi, Pratik D, DO  metoprolol tartrate (LOPRESSOR) 25 MG tablet Take 1 tablet (25 mg total) by mouth 2 (two) times daily.  05/15/19 06/14/19  Manuella Ghazi, Pratik D, DO  mometasone-formoterol (DULERA) 200-5 MCG/ACT AERO Inhale 1 puff into the lungs 2 (two) times daily. 05/15/19 06/14/19  Manuella Ghazi, Pratik D, DO  predniSONE (DELTASONE) 20 MG tablet Take 2 tablets (40 mg total) by mouth daily with breakfast for 2 days. 05/16/19 05/18/19  Heath Lark D, DO    Current Facility-Administered Medications  Medication Dose Route Frequency Provider Last Rate Last Dose  . acetaminophen (TYLENOL) tablet 650 mg  650 mg Oral Q6H PRN Zierle-Ghosh, Asia B, DO       Or  . acetaminophen (TYLENOL)  suppository 650 mg  650 mg Rectal Q6H PRN Zierle-Ghosh, Asia B, DO      . acetylcysteine (MUCOMYST) 20 % nebulizer / oral solution 2 mL  2 mL Nebulization TID Manuella Ghazi, Pratik D, DO   2 mL at 05/15/19 1342  . allopurinol (ZYLOPRIM) tablet 100 mg  100 mg Oral Daily Zierle-Ghosh, Asia B, DO   100 mg at 05/14/19 0944  . ALPRAZolam Duanne Moron) tablet 0.125-0.25 mg  0.125-0.25 mg Oral BID PRN Zierle-Ghosh, Asia B, DO   0.125 mg at 05/14/19 2128  . amitriptyline (ELAVIL) tablet 50 mg  50 mg Oral QHS Zierle-Ghosh, Asia B, DO   50 mg at 05/14/19 2128  . budesonide (PULMICORT) nebulizer solution 0.25 mg  0.25 mg Nebulization BID Manuella Ghazi, Pratik D, DO   0.25 mg at 05/15/19 [id number]  . cefTRIAXone (ROCEPHIN) 1 g in sodium chloride 0.9 % 100 mL IVPB  1 g Intravenous Q24H Zierle-Ghosh, Asia B, DO 200 mL/hr at 05/15/19 0343 1 g at 05/15/19 0343  . enoxaparin (LOVENOX) injection 30 mg  30 mg Subcutaneous Q24H Zierle-Ghosh, Asia B, DO   30 mg at 05/15/19 0700  . feeding supplement (ENSURE ENLIVE) (ENSURE ENLIVE) liquid 237 mL  237 mL Oral BID BM Zierle-Ghosh, Asia B, DO   237 mL at 05/14/19 1708  . levalbuterol (XOPENEX) nebulizer solution 0.63 mg  0.63 mg Nebulization Q6H PRN Manuella Ghazi, Pratik D, DO   0.63 mg at 05/15/19 1342  . metoprolol tartrate (LOPRESSOR) tablet 25 mg  25 mg Oral BID Heath Lark D, DO   25 mg at 05/14/19 2128  . mometasone-formoterol (DULERA) 200-5 MCG/ACT inhaler 1 puff  1 puff Inhalation BID Zierle-Ghosh, Asia B, DO   1 puff at 05/15/19 0712  . multivitamin with minerals tablet 1 tablet  1 tablet Oral Daily Manuella Ghazi, Pratik D, DO   1 tablet at 05/14/19 0944  . nicotine (NICODERM CQ - dosed in mg/24 hours) patch 14 mg  14 mg Transdermal Daily Zierle-Ghosh, Asia B, DO   14 mg at 05/15/19 1223  . pantoprazole (PROTONIX) EC tablet 40 mg  40 mg Oral Daily Zierle-Ghosh, Asia B, DO   40 mg at 05/14/19 0944  . predniSONE (DELTASONE) tablet 40 mg  40 mg Oral Q breakfast Zierle-Ghosh, Asia B, DO   40 mg at 05/14/19 0944  .  senna-docusate (Senokot-S) tablet 1 tablet  1 tablet Oral Daily PRN Zierle-Ghosh, Asia B, DO      . traMADol (ULTRAM) tablet 50 mg  50 mg Oral Q6H PRN Zierle-Ghosh, Asia B, DO   50 mg at 05/15/19 1443    Allergies as of 05/12/2019 - Review Complete 05/12/2019  Allergen Reaction Noted  . Other Nausea Only 07/23/2013  . Oxycodone Nausea And Vomiting 09/25/2016    No family history on file.  Social History   Socioeconomic History  . Marital status: Divorced    Spouse name: Not  on file  . Number of children: Not on file  . Years of education: Not on file  . Highest education level: Not on file  Occupational History  . Not on file  Social Needs  . Financial resource strain: Not on file  . Food insecurity    Worry: Not on file    Inability: Not on file  . Transportation needs    Medical: Not on file    Non-medical: Not on file  Tobacco Use  . Smoking status: Current Every Day Smoker    Packs/day: 1.00    Years: 55.00    Pack years: 55.00    Types: Cigarettes  . Smokeless tobacco: Never Used  . Tobacco comment: quit smoking since 1st of July.  Smoked all her life.   Substance and Sexual Activity  . Alcohol use: Yes    Comment: 2 shots at night  . Drug use: No  . Sexual activity: Never    Birth control/protection: Surgical  Lifestyle  . Physical activity    Days per week: Not on file    Minutes per session: Not on file  . Stress: Not on file  Relationships  . Social Herbalist on phone: Not on file    Gets together: Not on file    Attends religious service: Not on file    Active member of club or organization: Not on file    Attends meetings of clubs or organizations: Not on file    Relationship status: Not on file  . Intimate partner violence    Fear of current or ex partner: Not on file    Emotionally abused: Not on file    Physically abused: Not on file    Forced sexual activity: Not on file  Other Topics Concern  . Not on file  Social History  Narrative  . Not on file    Review of Systems: See HPI, otherwise normal ROS  Physical Exam: Temp:  [97.7 F (36.5 C)-98.4 F (36.9 C)] 97.7 F (36.5 C) (09/11 1448) Pulse Rate:  [85-107] 93 (09/11 1448) Resp:  [18-19] 19 (09/11 1448) BP: (113-122)/(62-69) 122/67 (09/11 1448) SpO2:  [95 %-98 %] 95 % (09/11 1448) Last BM Date: 05/13/19 Patient is alert and in no acute distress. He has generalized muscle wasting. Conjunctivae is pink.  Sclerae nonicteric. Oropharyngeal mucosa is normal.  She has loose fitting upper and lower dentures. Neck without thyromegaly or adenopathy. Cardiac exam with regular rhythm normal S1 and S2.  No murmur gallop noted. Auscultation lungs reveal vesicular breath sounds bilaterally without rales or rhonchi. Abdomen is symmetrical.  She has lower midline and appendectomy scars.  Abdomen is soft and nontender with organomegaly or masses. She does not have clubbing or peripheral edema.   Lab Results: Recent Labs    05/13/19 0511 05/14/19 0622 05/15/19 0640  WBC 8.1 7.1 6.4  HGB 12.6 11.9* 11.6*  HCT 40.3 38.5 36.8  PLT 271 302 293   BMET Recent Labs    05/13/19 0511 05/14/19 0622 05/15/19 0640  NA 134* 138 134*  K 3.4* 4.4 4.3  CL 101 101 98  CO2 23 31 28   GLUCOSE 204* 83 88  BUN 10 12 12   CREATININE 0.59 0.63 0.56  CALCIUM 8.2* 8.7* 8.7*   LFT Recent Labs    05/13/19 0511  PROT 5.3*  ALBUMIN 3.1*  AST 37  ALT 21  ALKPHOS 67  BILITOT 0.4   PT/INR No results for input(s): LABPROT,  INR in the last 72 hours. Hepatitis Panel No results for input(s): HEPBSAG, HCVAB, HEPAIGM, HEPBIGM in the last 72 hours.  Studies/Results: Dg Esophagus W Single Cm (sol Or Thin Ba)  Result Date: 05/14/2019 CLINICAL DATA:  Dysphagia, difficulty swallowing meats and medications, feels like food stuck in her throat and chest, choking, history GERD, hiatal hernia, COPD EXAM: ESOPHOGRAM/BARIUM SWALLOW TECHNIQUE: Single contrast examination was  performed using thin barium. Patient also swallowed a 12.5 mm barium tablet FLUOROSCOPY TIME:  Fluoroscopy Time:  3 minutes 30 seconds Radiation Exposure Index (if provided by the fluoroscopic device): 40.5 mGy Number of Acquired Spot Images: 1 plus multiple fluoroscopic screen captures COMPARISON:  Chest radiograph 05/12/2019 FINDINGS: Normal esophageal distention. No esophageal mass or stricture. Diffuse moderate to severe impairment of esophageal motility with incomplete clearance of barium by primary peristaltic waves. Scattered secondary and tertiary waves are seen as well as intermittent retrograde peristalsis. Large hiatal hernia with < 50% of stomach in the inferior mediastinum. Normal appearance of rugal folds without definite gastric mass or ulceration. No gastric outlet obstruction. Duodenal bulb and sweep normal appearance. Patient swallowed a 12.5 mm diameter barium tablet. However, since the exam was performed supine with patient's head of bed elevated a maximum 20 degrees, this did not pass well through the esophagus due to lack of upright positioning and moderate to severe impairment of esophageal motility. This remained at the distal esophagus at the conclusion of the exam, uncertain if due to stricture or poor motility. IMPRESSION: Large hiatal hernia. Moderate to severe diffuse impairment of esophageal motility. Barium tablet state in the distal esophagus at the conclusion of the exam, though uncertain if this is due to esophageal stricture or moderate to severe dysmotility. Electronically Signed   By: Lavonia Dana M.D.   On: 05/14/2019 16:24   I have reviewed images alongside Dr. Thornton Papas.  Patient has distal esophageal stricture in addition to tertiary contractions and large hiatal hernia.  Assessment;  Patient is 85 year old Caucasian female with well-documented history of high-grade distal esophageal stricture as well as large hiatal hernia who was hospitalized 3 days ago for exacerbation of  COPD and is responding to therapy.  The patient also has been experiencing dysphagia with solids and pills.  Barium study reveals multiple findings as above.  While esophageal dysmotility may be contributing to her dysphagia but stricture appears to be playing a significant role as barium pill failed to pass this segment.  Patient would benefit from esophageal dilation as she has in the past.  Patient would require monitored anesthesia care. Dr. Hilaria Ota of anesthesiology has seen the patient and is not comfortable giving her propofol and recommends doing the procedure on an outpatient basis.  I do not agree with his recommendations but I have no choice to planned procedure on an outpatient basis.  Chronic COPD with recent exacerbation.  Patient continues to smoke cigarettes.  Malnutrition with low body mass index.  Patient's BMI is 12.74.  She is successful in quitting cigarette smoking it may help improve her appetite and weight.   Recommendations;  Patient advised to stick with mechanical soft or pured diet. She should also be upright at mealtime and should take small boluses and eat slowly and not rush. We will arrange for outpatient esophagogastroduodenoscopy with esophageal dilation.   LOS: 2 days   Vana Arif  05/15/2019, 3:54 PM

## 2019-05-15 NOTE — Discharge Summary (Signed)
Physician Discharge Summary  Taylor Cannon [id number] DOB: 1934/02/25 DOA: 05/12/2019  PCP: Elyn Aquas  Admit date: 05/12/2019  Discharge date: 05/15/2019  Admitted From:Home  Disposition:  Home  Recommendations for Outpatient Follow-up:  1. Follow up with PCP in 1-2 weeks 2. Dr. Laural Golden to arrange follow up regarding EGD with dilation to hopefully take place early next week; discussed with daughter.  Unfortunately, EGD with dilation could not be pursued while inpatient due to disapproval of the procedure by anesthesiology despite IM and GI recommendations. 3. Continue prednisone for 2 more days to complete course of treatment 4. Albuterol prescribed as needed for shortness of breath or wheezing at home 5. No further need for antibiotics noted at this time 6. Continue on Dulera as prescribed 7. Continue on 2 L nasal cannula oxygen at home 24/7  Home Health: Yes with PT  Equipment/Devices: Home 2 L nasal cannula  Discharge Condition: Stable  CODE STATUS: Full  Diet recommendation: Heart Healthy  Brief/Interim Summary:  Per HPI: Taylor Cannon a44 y.o.female,with history of TIA,COPD requiring 2 L nasal cannula at night,anxiety and GERD with esophageal webs presents today for congestion. Patient reports that 2 weeks ago she started saying she had upper respiratory congestion and then 4 days ago she said she wanted to be seen because she felt like it was moving to her chest. It was the holiday weekend so they can go in then. They went to urgent care this morning in urgent care noticed that her heart rate was elevated and oxygen saturations were decreased they advised patient to going to be seen by the ER. Patient wanted come in so she hospitals could be done. They tested her for COVID and sent her home telling her to use her supplemental oxygen during the day as well.Patient has a pulse oximeter at home and she noticed that her heart rate was 137 and her oxygen  saturation was 95% she called her daughter and they decided to come into the ED. Patient has associated chronic fatigue since this congestion started. Daughter reports patient will only get out of bed for an hour at a time and then go back to bed with the slightest amount of exertion exhausting her. In the ED patient was saturating in the mid 90s on 2 L nasal cannula but upon ambulation she dropped to 69%. She is also tachypneic in the mid 20s. Heart rate was 120. She was started on Solu-Medrol, doxycycline, given a 500 mL bolus and given albuterol in the ED. COVID was negative. Chest x-ray showed cardiomegaly, COPD, hiatal hernia, no acute active disease. Tropes were stable x2. Electrolytes were good. Lactic acid was 1.1 blood cultures were drawn. Patient is being admitted for the management of a COPD exacerbation.  9/10: Patient noted to be symptomatically improved this morning and remains on 2 L nasal cannula.  PT recommending home health.  Discussed case with GI, Dr.Rehman recommends barium swallow to further evaluate dysphagia and will consider EGD with dilation as needed.  2D echocardiogram performed today with results pending.  Continues to remain somewhat tachycardic.  9/11: 2D echocardiogram with no significant abnormalities and EF 60-65% noted.  She is clinically much improved this morning, but still requires 2 L nasal cannula.  No active wheezing otherwise noted.  She still has some very mild tachycardia, but this is also improved.    She is able to tolerate very small amounts of her diet and has been seen by GI with recommendations for EGD and dilation given  the fact that she had barium swallow performed on 9/10 demonstrating significant esophageal dysmotility versus stricture.  There was concern of ongoing microaspiration that may be contributing to her COPD process.    Unfortunately, anesthesiology, Dr. Alvy Beal, did not feel that EGD with dilation was urgently required and refused  to administer anesthesia for the procedure-stating that nonurgent procedures are not pursued on the weekends. He instead recommended that the procedure be done outpatient once patient's respiratory condition further improved.  The patient, however did not exhibit any significant respiratory distress and was otherwise in stable condition to have the procedure performed per IM and GI assessment.  I discussed the situation with patient's daughter and with GI Dr. Laural Golden. Dr. Laural Golden plans to follow-up with patient early next week and schedule procedure as an outpatient.  No other acute events noted throughout the course of this admission patient is otherwise stable for discharge with instructions as noted above.  Discharge Diagnoses:  Active Problems:   COPD exacerbation ([place])  Principal discharge diagnosis: Acute on chronic COPD exacerbation possibly secondary to ongoing microaspiration related to distal, high-grade esophageal stricture.  Discharge Instructions  Discharge Instructions    Diet - low sodium heart healthy   Complete by: As directed    Increase activity slowly   Complete by: As directed      Allergies as of 05/15/2019      Reactions   Other Nausea Only   Steroids   Oxycodone Nausea And Vomiting      Medication List    TAKE these medications   albuterol 108 (90 Base) MCG/ACT inhaler Commonly known as: VENTOLIN HFA Inhale 2 puffs into the lungs every 6 (six) hours as needed for wheezing or shortness of breath.   allopurinol 100 MG tablet Commonly known as: ZYLOPRIM Take 1 tablet by mouth daily.   ALPRAZolam 0.25 MG tablet Commonly known as: XANAX Take 1 tablet (0.25 mg total) by mouth 2 (two) times daily as needed for anxiety. What changed:   how much to take  when to take this  reasons to take this   amitriptyline 50 MG tablet Commonly known as: ELAVIL Take 50 mg by mouth at bedtime.   metoprolol tartrate 25 MG tablet Commonly known as: LOPRESSOR Take 1  tablet (25 mg total) by mouth 2 (two) times daily.   mirtazapine 30 MG disintegrating tablet Commonly known as: REMERON SOL-TAB Take 1 tablet by mouth daily.   mometasone-formoterol 200-5 MCG/ACT Aero Commonly known as: DULERA Inhale 1 puff into the lungs 2 (two) times daily.   MULTIVITAMIN PO Take 1 tablet by mouth daily.   omeprazole 40 MG capsule Commonly known as: PRILOSEC Take 1 capsule by mouth daily.   OXYGEN Inhale 2 L into the lungs at bedtime.   predniSONE 20 MG tablet Commonly known as: DELTASONE Take 2 tablets (40 mg total) by mouth daily with breakfast for 2 days. Start taking on: May 16, 2019   vitamin C 500 MG tablet Commonly known as: ASCORBIC ACID Take 500 mg by mouth daily.            Durable Medical Equipment  (From admission, onward)         Start     Ordered   05/15/19 1325  For home use only DME oxygen  Once    Question Answer Comment  Length of Need Lifetime   Mode or (Route) Nasal cannula   Liters per Minute 2   Frequency Continuous (stationary and portable oxygen unit needed)  Oxygen conserving device Yes   Oxygen delivery system Gas      05/15/19 1324         Follow-up [facility] Follow up.   Why: home health PT Contact information: [place] [address] [place] Follow up in 1 week(s).   Specialty: Family Medicine Contact information: [address] [place] [address] [phone]        Rogene Houston, MD Follow up in 1 week(s).   Specialty: Gastroenterology Contact information: [address], [address] [place] [address] [phone]          Allergies  Allergen Reactions  . Other Nausea Only    Steroids  . Oxycodone Nausea And Vomiting    Consultations:  GI Dr. Laural Golden   Procedures/Studies: Dg Chest Portable 1 View  Result Date: 05/12/2019 CLINICAL DATA:  Shortness of breath EXAM: PORTABLE  CHEST 1 VIEW COMPARISON:  04/12/2019 FINDINGS: There is hyperinflation of the lungs compatible with COPD. Cardiomegaly. Moderate-sized hiatal hernia. No confluent opacities or effusions. No acute bony abnormality. IMPRESSION: Cardiomegaly, COPD. Hiatal hernia. No active disease. Electronically Signed   By: Rolm Baptise M.D.   On: 05/12/2019 23:35   Dg Esophagus W Single Cm (sol Or Thin Ba)  Result Date: 05/14/2019 CLINICAL DATA:  Dysphagia, difficulty swallowing meats and medications, feels like food stuck in her throat and chest, choking, history GERD, hiatal hernia, COPD EXAM: ESOPHOGRAM/BARIUM SWALLOW TECHNIQUE: Single contrast examination was performed using thin barium. Patient also swallowed a 12.5 mm barium tablet FLUOROSCOPY TIME:  Fluoroscopy Time:  3 minutes 30 seconds Radiation Exposure Index (if provided by the fluoroscopic device): 40.5 mGy Number of Acquired Spot Images: 1 plus multiple fluoroscopic screen captures COMPARISON:  Chest radiograph 05/12/2019 FINDINGS: Normal esophageal distention. No esophageal mass or stricture. Diffuse moderate to severe impairment of esophageal motility with incomplete clearance of barium by primary peristaltic waves. Scattered secondary and tertiary waves are seen as well as intermittent retrograde peristalsis. Large hiatal hernia with < 50% of stomach in the inferior mediastinum. Normal appearance of rugal folds without definite gastric mass or ulceration. No gastric outlet obstruction. Duodenal bulb and sweep normal appearance. Patient swallowed a 12.5 mm diameter barium tablet. However, since the exam was performed supine with patient's head of bed elevated a maximum 20 degrees, this did not pass well through the esophagus due to lack of upright positioning and moderate to severe impairment of esophageal motility. This remained at the distal esophagus at the conclusion of the exam, uncertain if due to stricture or poor motility. IMPRESSION: Large hiatal hernia.  Moderate to severe diffuse impairment of esophageal motility. Barium tablet state in the distal esophagus at the conclusion of the exam, though uncertain if this is due to esophageal stricture or moderate to severe dysmotility. Electronically Signed   By: Lavonia Dana M.D.   On: 05/14/2019 16:24     Discharge Exam: Vitals:   05/15/19 1342 05/15/19 1448  BP:  122/67  Pulse:  93  Resp:  19  Temp:  97.7 F (36.5 C)  SpO2: 96% 95%   Vitals:   05/15/19 0534 05/15/19 0713 05/15/19 1342 05/15/19 1448  BP: 113/62   122/67  Pulse: 85   93  Resp: 18   19  Temp: 97.8 F (36.6 C)   97.7 F (36.5 C)  TempSrc:    Oral  SpO2: 97% 97%  96% 95%  Weight:      Height:        General: Pt is alert, awake, not in acute distress Cardiovascular: RRR, S1/S2 +, no rubs, no gallops Respiratory: CTA bilaterally, no wheezing, no rhonchi, 2 L nasal cannula Abdominal: Soft, NT, ND, bowel sounds + Extremities: no edema, no cyanosis    The results of significant diagnostics from this hospitalization (including imaging, microbiology, ancillary and laboratory) are listed below for reference.     Microbiology: Recent Results (from the past 240 hour(s))  Blood culture (routine x 2)     Status: None (Preliminary result)   Collection Time: 05/12/19 11:28 PM   Specimen: BLOOD  Result Value Ref Range Status   Specimen Description BLOOD RIGHT ANTECUBITAL  Final   Special Requests   Final    BOTTLES DRAWN AEROBIC AND ANAEROBIC Blood Culture adequate volume   Culture   Final    NO GROWTH 3 DAYS Performed at [facility], [address], [place], [place] [address]    Report Status PENDING  Incomplete  Blood culture (routine x 2)     Status: None (Preliminary result)   Collection Time: 05/12/19 11:29 PM   Specimen: BLOOD  Result Value Ref Range Status   Specimen Description BLOOD LEFT ANTECUBITAL  Final   Special Requests   Final    BOTTLES DRAWN AEROBIC AND ANAEROBIC Blood Culture adequate volume    Culture   Final    NO GROWTH 3 DAYS Performed at [facility], [address] [place], [place] [address]    Report Status PENDING  Incomplete  SARS Coronavirus 2 [facility] order, Performed in [place] hospital lab) Nasopharyngeal Nasopharyngeal Swab     Status: None   Collection Time: 05/12/19 11:33 PM   Specimen: Nasopharyngeal Swab  Result Value Ref Range Status   SARS Coronavirus 2 NEGATIVE NEGATIVE Final    Comment: (NOTE) If result is NEGATIVE SARS-CoV-2 target nucleic acids are NOT DETECTED. The SARS-CoV-2 RNA is generally detectable in upper and lower  respiratory specimens during the acute phase of infection. The lowest  concentration of SARS-CoV-2 viral copies this assay can detect is 250  copies / mL. A negative result does not preclude SARS-CoV-2 infection  and should not be used as the sole basis for treatment or other  patient management decisions.  A negative result may occur with  improper specimen collection / handling, submission of specimen other  than nasopharyngeal swab, presence of viral mutation(s) within the  areas targeted by this assay, and inadequate number of viral copies  (<250 copies / mL). A negative result must be combined with clinical  observations, patient history, and epidemiological information. If result is POSITIVE SARS-CoV-2 target nucleic acids are DETECTED. The SARS-CoV-2 RNA is generally detectable in upper and lower  respiratory specimens dur ing the acute phase of infection.  Positive  results are indicative of active infection with SARS-CoV-2.  Clinical  correlation with patient history and other diagnostic information is  necessary to determine patient infection status.  Positive results do  not rule out bacterial infection or co-infection with other viruses. If result is PRESUMPTIVE POSTIVE SARS-CoV-2 nucleic acids MAY BE PRESENT.   A presumptive positive result was obtained on the submitted specimen  and confirmed on repeat  testing.  While 2019 novel coronavirus  (SARS-CoV-2) nucleic acids may be present in the submitted sample  additional confirmatory testing may be necessary for epidemiological  and / or clinical management purposes  to differentiate between  SARS-CoV-2 and other Sarbecovirus currently known to infect humans.  If clinically indicated additional testing with an alternate test  methodology [phone]) is advised. The SARS-CoV-2 RNA is generally  detectable in upper and lower respiratory sp ecimens during the acute  phase of infection. The expected result is Negative. Fact Sheet for Patients:  StrictlyIdeas.no Fact Sheet for Healthcare Providers: BankingDealers.co.za This test is not yet approved or cleared by the [place] FDA and has been authorized for detection and/or diagnosis of SARS-CoV-2 by FDA under an Emergency Use Authorization (EUA).  This EUA will remain in effect (meaning this test can be used) for the duration of the COVID-19 declaration under Section 564(b)(1) of the Act, 21 U.S.C. section 360bbb-3(b)(1), unless the authorization is terminated or revoked sooner. Performed at [facility], [address], [place], [place] [address]      Labs: BNP (last 3 results) Recent Labs    05/12/19 1939  BNP 99991111   Basic Metabolic Panel: Recent Labs  Lab 05/12/19 1939 05/13/19 0511 05/14/19 0622 05/15/19 0640  NA 134* 134* 138 134*  K 4.1 3.4* 4.4 4.3  CL 96* 101 101 98  CO2 30 23 31 28   GLUCOSE 105* 204* 83 88  BUN 10 10 12 12   CREATININE 0.70 0.59 0.63 0.56  CALCIUM 8.7* 8.2* 8.7* 8.7*  MG  --   --  1.6* 1.8   Liver Function Tests: Recent Labs  Lab 05/13/19 0511  AST 37  ALT 21  ALKPHOS 67  BILITOT 0.4  PROT 5.3*  ALBUMIN 3.1*   No results for input(s): LIPASE, AMYLASE in the last 168 hours. No results for input(s): AMMONIA in the last 168 hours. CBC: Recent Labs  Lab 05/12/19 1939 05/13/19 0511  05/14/19 0622 05/15/19 0640  WBC 9.6 8.1 7.1 6.4  NEUTROABS 7.9*  --   --   --   HGB 14.6 12.6 11.9* 11.6*  HCT 46.2* 40.3 38.5 36.8  MCV 97.7 99.3 99.0 98.1  PLT 377 271 302 293   Cardiac Enzymes: No results for input(s): CKTOTAL, CKMB, CKMBINDEX, TROPONINI in the last 168 hours. BNP: Invalid input(s): POCBNP CBG: No results for input(s): GLUCAP in the last 168 hours. D-Dimer Recent Labs    05/12/19 1939  DDIMER 0.28   Hgb A1c No results for input(s): HGBA1C in the last 72 hours. Lipid Profile No results for input(s): CHOL, HDL, LDLCALC, TRIG, CHOLHDL, LDLDIRECT in the last 72 hours. Thyroid function studies Recent Labs    05/12/19 2134  TSH 2.482   Anemia work up No results for input(s): VITAMINB12, FOLATE, FERRITIN, TIBC, IRON, RETICCTPCT in the last 72 hours. Urinalysis    Component Value Date/Time   COLORURINE YELLOW 09/16/2015 0930   APPEARANCEUR CLEAR 09/16/2015 0930   LABSPEC <1.005 (L) 09/16/2015 0930   PHURINE 5.5 09/16/2015 0930   GLUCOSEU NEGATIVE 09/16/2015 0930   HGBUR NEGATIVE 09/16/2015 0930   BILIRUBINUR NEGATIVE 09/16/2015 0930   KETONESUR NEGATIVE 09/16/2015 0930   PROTEINUR NEGATIVE 09/16/2015 0930   NITRITE NEGATIVE 09/16/2015 0930   LEUKOCYTESUR NEGATIVE 09/16/2015 0930   Sepsis Labs Invalid input(s): PROCALCITONIN,  WBC,  LACTICIDVEN Microbiology Recent Results (from the past 240 hour(s))  Blood culture (routine x 2)     Status: None (Preliminary result)   Collection Time: 05/12/19 11:28 PM   Specimen: BLOOD  Result Value Ref Range Status   Specimen Description BLOOD RIGHT ANTECUBITAL  Final   Special Requests   Final    BOTTLES DRAWN AEROBIC AND ANAEROBIC  Blood Culture adequate volume   Culture   Final    NO GROWTH 3 DAYS Performed at [facility], [address] [place], [place] [address]    Report Status PENDING  Incomplete  Blood culture (routine x 2)     Status: None (Preliminary result)   Collection Time: 05/12/19 11:29 PM    Specimen: BLOOD  Result Value Ref Range Status   Specimen Description BLOOD LEFT ANTECUBITAL  Final   Special Requests   Final    BOTTLES DRAWN AEROBIC AND ANAEROBIC Blood Culture adequate volume   Culture   Final    NO GROWTH 3 DAYS Performed at [facility], [address], [place], [place] [address]    Report Status PENDING  Incomplete  SARS Coronavirus 2 [facility] order, Performed in [place] hospital lab) Nasopharyngeal Nasopharyngeal Swab     Status: None   Collection Time: 05/12/19 11:33 PM   Specimen: Nasopharyngeal Swab  Result Value Ref Range Status   SARS Coronavirus 2 NEGATIVE NEGATIVE Final    Comment: (NOTE) If result is NEGATIVE SARS-CoV-2 target nucleic acids are NOT DETECTED. The SARS-CoV-2 RNA is generally detectable in upper and lower  respiratory specimens during the acute phase of infection. The lowest  concentration of SARS-CoV-2 viral copies this assay can detect is 250  copies / mL. A negative result does not preclude SARS-CoV-2 infection  and should not be used as the sole basis for treatment or other  patient management decisions.  A negative result may occur with  improper specimen collection / handling, submission of specimen other  than nasopharyngeal swab, presence of viral mutation(s) within the  areas targeted by this assay, and inadequate number of viral copies  (<250 copies / mL). A negative result must be combined with clinical  observations, patient history, and epidemiological information. If result is POSITIVE SARS-CoV-2 target nucleic acids are DETECTED. The SARS-CoV-2 RNA is generally detectable in upper and lower  respiratory specimens dur ing the acute phase of infection.  Positive  results are indicative of active infection with SARS-CoV-2.  Clinical  correlation with patient history and other diagnostic information is  necessary to determine patient infection status.  Positive results do  not rule out bacterial infection or  co-infection with other viruses. If result is PRESUMPTIVE POSTIVE SARS-CoV-2 nucleic acids MAY BE PRESENT.   A presumptive positive result was obtained on the submitted specimen  and confirmed on repeat testing.  While 2019 novel coronavirus  (SARS-CoV-2) nucleic acids may be present in the submitted sample  additional confirmatory testing may be necessary for epidemiological  and / or clinical management purposes  to differentiate between  SARS-CoV-2 and other Sarbecovirus currently known to infect humans.  If clinically indicated additional testing with an alternate test  methodology [phone]) is advised. The SARS-CoV-2 RNA is generally  detectable in upper and lower respiratory sp ecimens during the acute  phase of infection. The expected result is Negative. Fact Sheet for Patients:  StrictlyIdeas.no Fact Sheet for Healthcare Providers: BankingDealers.co.za This test is not yet approved or cleared by the [place] FDA and has been authorized for detection and/or diagnosis of SARS-CoV-2 by FDA under an Emergency Use Authorization (EUA).  This EUA will remain in effect (meaning this test can be used) for the duration of the COVID-19 declaration under Section 564(b)(1) of the Act, 21 U.S.C. section 360bbb-3(b)(1), unless the authorization is terminated or revoked sooner. Performed at [facility], [address], [place], [place] [address]  Time coordinating discharge: 35 minutes  SIGNED:   Rodena Goldmann, DO [facility] 05/15/2019, 3:11 PM  If 7PM-7AM, please contact night-coverage [email or web address] Password [email or web address]

## 2019-05-17 LAB — CULTURE, BLOOD (ROUTINE X 2)
Culture: NO GROWTH
Culture: NO GROWTH
Special Requests: ADEQUATE
Special Requests: ADEQUATE

## 2019-05-19 ENCOUNTER — Other Ambulatory Visit (INDEPENDENT_AMBULATORY_CARE_PROVIDER_SITE_OTHER): Payer: Self-pay | Admitting: *Deleted

## 2019-05-19 DIAGNOSIS — K222 Esophageal obstruction: Secondary | ICD-10-CM

## 2019-05-20 ENCOUNTER — Other Ambulatory Visit: Payer: Self-pay

## 2019-05-20 ENCOUNTER — Encounter (HOSPITAL_COMMUNITY)
Admission: RE | Admit: 2019-05-20 | Discharge: 2019-05-20 | Disposition: A | Discharge status: Home / Self Care | Payer: Medicare HMO | Source: Ambulatory Visit | Attending: Internal Medicine | Admitting: Internal Medicine

## 2019-05-20 ENCOUNTER — Other Ambulatory Visit (HOSPITAL_COMMUNITY)
Admission: RE | Admit: 2019-05-20 | Discharge: 2019-05-20 | Disposition: A | Discharge status: Home / Self Care | Payer: Medicare HMO | Source: Ambulatory Visit | Attending: Internal Medicine | Admitting: Internal Medicine

## 2019-05-20 ENCOUNTER — Encounter (HOSPITAL_COMMUNITY): Payer: Self-pay

## 2019-05-20 DIAGNOSIS — Z01812 Encounter for preprocedural laboratory examination: Secondary | ICD-10-CM | POA: Diagnosis present

## 2019-05-20 DIAGNOSIS — Z20828 Contact with and (suspected) exposure to other viral communicable diseases: Secondary | ICD-10-CM | POA: Insufficient documentation

## 2019-05-21 ENCOUNTER — Ambulatory Visit (HOSPITAL_COMMUNITY): Payer: Medicare HMO | Admitting: Anesthesiology

## 2019-05-21 ENCOUNTER — Ambulatory Visit (HOSPITAL_COMMUNITY)
Admission: RE | Admit: 2019-05-21 | Discharge: 2019-05-21 | Disposition: A | Discharge status: Home / Self Care | Payer: Medicare HMO | Attending: Internal Medicine | Admitting: Internal Medicine

## 2019-05-21 ENCOUNTER — Encounter (HOSPITAL_COMMUNITY): Payer: Self-pay

## 2019-05-21 ENCOUNTER — Other Ambulatory Visit: Payer: Self-pay

## 2019-05-21 ENCOUNTER — Encounter (HOSPITAL_COMMUNITY)
Admission: RE | Disposition: A | Discharge status: Home / Self Care | Payer: Self-pay | Source: Home / Self Care | Attending: Internal Medicine

## 2019-05-21 DIAGNOSIS — E46 Unspecified protein-calorie malnutrition: Secondary | ICD-10-CM | POA: Insufficient documentation

## 2019-05-21 DIAGNOSIS — Z8673 Personal history of transient ischemic attack (TIA), and cerebral infarction without residual deficits: Secondary | ICD-10-CM | POA: Insufficient documentation

## 2019-05-21 DIAGNOSIS — K219 Gastro-esophageal reflux disease without esophagitis: Secondary | ICD-10-CM | POA: Insufficient documentation

## 2019-05-21 DIAGNOSIS — F17210 Nicotine dependence, cigarettes, uncomplicated: Secondary | ICD-10-CM | POA: Diagnosis not present

## 2019-05-21 DIAGNOSIS — Z79899 Other long term (current) drug therapy: Secondary | ICD-10-CM | POA: Diagnosis not present

## 2019-05-21 DIAGNOSIS — R1314 Dysphagia, pharyngoesophageal phase: Secondary | ICD-10-CM

## 2019-05-21 DIAGNOSIS — Z681 Body mass index (BMI) 19 or less, adult: Secondary | ICD-10-CM | POA: Insufficient documentation

## 2019-05-21 DIAGNOSIS — K269 Duodenal ulcer, unspecified as acute or chronic, without hemorrhage or perforation: Secondary | ICD-10-CM | POA: Insufficient documentation

## 2019-05-21 DIAGNOSIS — Z885 Allergy status to narcotic agent status: Secondary | ICD-10-CM | POA: Insufficient documentation

## 2019-05-21 DIAGNOSIS — F418 Other specified anxiety disorders: Secondary | ICD-10-CM | POA: Diagnosis not present

## 2019-05-21 DIAGNOSIS — Z9981 Dependence on supplemental oxygen: Secondary | ICD-10-CM | POA: Insufficient documentation

## 2019-05-21 DIAGNOSIS — K222 Esophageal obstruction: Secondary | ICD-10-CM | POA: Diagnosis not present

## 2019-05-21 DIAGNOSIS — Z7951 Long term (current) use of inhaled steroids: Secondary | ICD-10-CM | POA: Insufficient documentation

## 2019-05-21 DIAGNOSIS — K449 Diaphragmatic hernia without obstruction or gangrene: Secondary | ICD-10-CM | POA: Insufficient documentation

## 2019-05-21 DIAGNOSIS — J449 Chronic obstructive pulmonary disease, unspecified: Secondary | ICD-10-CM | POA: Insufficient documentation

## 2019-05-21 HISTORY — PX: ESOPHAGEAL DILATION: SHX303

## 2019-05-21 HISTORY — PX: ESOPHAGOGASTRODUODENOSCOPY (EGD) WITH PROPOFOL: SHX5813

## 2019-05-21 LAB — SARS CORONAVIRUS 2 (TAT 6-24 HRS): SARS Coronavirus 2: NEGATIVE

## 2019-05-21 SURGERY — ESOPHAGOGASTRODUODENOSCOPY (EGD) WITH PROPOFOL
Anesthesia: General

## 2019-05-21 MED ORDER — LACTATED RINGERS IV SOLN
INTRAVENOUS | Status: DC | PRN
  Administered 2019-05-21: 14:00:00 via INTRAVENOUS

## 2019-05-21 MED ORDER — KETAMINE HCL 50 MG/5ML IJ SOSY
PREFILLED_SYRINGE | INTRAMUSCULAR | Status: AC
  Filled 2019-05-21: qty 5

## 2019-05-21 MED ORDER — LACTATED RINGERS IV SOLN
Freq: Once | INTRAVENOUS | Status: AC
  Administered 2019-05-21: 14:00:00 via INTRAVENOUS

## 2019-05-21 MED ORDER — CHLORHEXIDINE GLUCONATE CLOTH 2 % EX PADS: 6.0000 | MEDICATED_PAD | Freq: Once | CUTANEOUS | Status: DC

## 2019-05-21 MED ORDER — ONDANSETRON HCL 4 MG/2ML IJ SOLN
INTRAMUSCULAR | Status: AC
  Filled 2019-05-21: qty 2

## 2019-05-21 MED ORDER — PROPOFOL 10 MG/ML IV BOLUS
INTRAVENOUS | Status: DC | PRN
  Administered 2019-05-21: 10 mg via INTRAVENOUS

## 2019-05-21 MED ORDER — PANTOPRAZOLE SODIUM 40 MG PO TBEC: 40.0000 mg | DELAYED_RELEASE_TABLET | Freq: Two times a day (BID) | ORAL | 5 refills | Status: DC

## 2019-05-21 MED ORDER — KETAMINE HCL 10 MG/ML IJ SOLN
INTRAMUSCULAR | Status: DC | PRN
  Administered 2019-05-21: 10 mg via INTRAVENOUS

## 2019-05-21 MED ORDER — LIDOCAINE HCL (CARDIAC) PF 100 MG/5ML IV SOSY
PREFILLED_SYRINGE | INTRAVENOUS | Status: DC | PRN
  Administered 2019-05-21: 20 mg via INTRAVENOUS

## 2019-05-21 MED ORDER — ONDANSETRON HCL 4 MG/2ML IJ SOLN
INTRAMUSCULAR | Status: DC | PRN
  Administered 2019-05-21: 4 mg via INTRAVENOUS

## 2019-05-21 MED ORDER — PROPOFOL 500 MG/50ML IV EMUL
INTRAVENOUS | Status: DC | PRN
  Administered 2019-05-21: 150 ug/kg/min via INTRAVENOUS

## 2019-05-21 NOTE — Discharge Instructions (Signed)
Discontinue omeprazole. Begin pantoprazole 40 mg by mouth 30 minutes before breakfast and evening meal. Resume other medications as before. Do not take OTC NSAIDs. Resume usual diet.  Chew food thoroughly and eat slowly. Physician will call with results of blood test. Repeat dilation in 3 to 4 weeks.      Upper Endoscopy, Adult, Care After This sheet gives you information about how to care for yourself after your procedure. Your health care provider may also give you more specific instructions. If you have problems or questions, contact your health care provider. What can I expect after the procedure? After the procedure, it is common to have:  A sore throat.  Mild stomach pain or discomfort.  Bloating.  Nausea. Follow these instructions at home:   Follow instructions from your health care provider about what to eat or drink after your procedure.  Return to your normal activities as told by your health care provider. Ask your health care provider what activities are safe for you.  Take over-the-counter and prescription medicines only as told by your health care provider.  Do not drive for 24 hours if you were given a sedative during your procedure.  Keep all follow-up visits as told by your health care provider. This is important. Contact a health care provider if you have:  A sore throat that lasts longer than one day.  Trouble swallowing. Get help right away if:  You vomit blood or your vomit looks like coffee grounds.  You have: ? A fever. ? Bloody, black, or tarry stools. ? A severe sore throat or you cannot swallow. ? Difficulty breathing. ? Severe pain in your chest or abdomen. Summary  After the procedure, it is common to have a sore throat, mild stomach discomfort, bloating, and nausea.  Do not drive for 24 hours if you were given a sedative during the procedure.  Follow instructions from your health care provider about what to eat or drink after your  procedure.  Return to your normal activities as told by your health care provider. This information is not intended to replace advice given to you by your health care provider. Make sure you discuss any questions you have with your health care provider. Document Released: 02/19/2012 Document Revised: 02/11/2018 Document Reviewed: 01/20/2018 Elsevier Patient Education  2020 [employer] After These instructions provide you with information about caring for yourself after your procedure. Your health care provider may also give you more specific instructions. Your treatment has been planned according to current medical practices, but problems sometimes occur. Call your health care provider if you have any problems or questions after your procedure. What can I expect after the procedure? After your procedure, you may:  Feel sleepy for several hours.  Feel clumsy and have poor balance for several hours.  Feel forgetful about what happened after the procedure.  Have poor judgment for several hours.  Feel nauseous or vomit.  Have a sore throat if you had a breathing tube during the procedure. Follow these instructions at home: For at least 24 hours after the procedure:      Have a responsible adult stay with you. It is important to have someone help care for you until you are awake and alert.  Rest as needed.  Do not: ? Participate in activities in which you could fall or become injured. ? Drive. ? Use heavy machinery. ? Drink alcohol. ? Take sleeping pills or medicines that cause drowsiness. ?  Make important decisions or sign legal documents. ? Take care of children on your own. Eating and drinking  Follow the diet that is recommended by your health care provider.  If you vomit, drink water, juice, or soup when you can drink without vomiting.  Make sure you have little or no nausea before eating solid foods. General instructions  Take  over-the-counter and prescription medicines only as told by your health care provider.  If you have sleep apnea, surgery and certain medicines can increase your risk for breathing problems. Follow instructions from your health care provider about wearing your sleep device: ? Anytime you are sleeping, including during daytime naps. ? While taking prescription pain medicines, sleeping medicines, or medicines that make you drowsy.  If you smoke, do not smoke without supervision.  Keep all follow-up visits as told by your health care provider. This is important. Contact a health care provider if:  You keep feeling nauseous or you keep vomiting.  You feel light-headed.  You develop a rash.  You have a fever. Get help right away if:  You have trouble breathing. Summary  For several hours after your procedure, you may feel sleepy and have poor judgment.  Have a responsible adult stay with you for at least 24 hours or until you are awake and alert. This information is not intended to replace advice given to you by your health care provider. Make sure you discuss any questions you have with your health care provider. Document Released: 12/11/2015 Document Revised: 11/18/2017 Document Reviewed: 12/11/2015 Elsevier Patient Education  2020 [employer].

## 2019-05-21 NOTE — H&P (Signed)
Taylor Cannon is an 85 y.o. female.   Chief Complaint: Patient is here for esophagogastroduodenoscopy and esophageal dilation. HPI: Patient is 85 year old Caucasian female with COPD on home O2 anxiety depression and chronic GERD who has history of high-grade esophageal stricture which has been dilated on multiple occasions most recently in October 2017.  She was hospitalized last week with exacerbation of COPD.  She had barium pill esophagogram.  She was noted to have esophageal dysmotility as well as distal esophageal stricture preventing passage of barium felt in the stomach.  She also has known large hiatal hernia.  She is now returning for repeat dilation. Patient remains with dysphagia to solids and at times she has difficulty with liquids.  She says she has not smoked cigarettes in 2 weeks.  Past Medical History:  Diagnosis Date  . Anxiety   . COPD (chronic obstructive pulmonary disease) ([place])   . Depression   . GERD (gastroesophageal reflux disease)   . On home oxygen therapy    2L via [place] at night  . TIA (transient ischemic attack)     Past Surgical History:  Procedure Laterality Date  . APPENDECTOMY    . BALLOON DILATION N/A 07/24/2013   Procedure: BALLOON DILATION to 13.48mm;  Surgeon: Rogene Houston, MD;  Location: [facility];  Service: Endoscopy;  Laterality: N/A;  . BALLOON DILATION N/A 11/11/2014   Procedure: BALLOON DILATION;  Surgeon: Rogene Houston, MD;  [facility];  Service: Endoscopy;  Laterality: N/A;  . CARDIAC ELECTROPHYSIOLOGY STUDY AND ABLATION    . CATARACT EXTRACTION Bilateral   . CERVICAL CONE BIOPSY    . ESOPHAGEAL DILATION N/A 11/30/2014   Procedure: ESOPHAGEAL DILATION;  Surgeon: Rogene Houston, MD;  Location: [facility];  Service: Endoscopy;  Laterality: N/A;  Balloon, 15, 16.5  . ESOPHAGEAL DILATION N/A 06/03/2015   Procedure: ESOPHAGEAL DILATION WITH 16.5 MM BALLOON ;  Surgeon: Rogene Houston, MD;  Location: [facility];  Service: Endoscopy;  Laterality:  N/A;  . ESOPHAGEAL DILATION N/A 09/09/2015   Procedure: ESOPHAGEAL DILATION;  Surgeon: Rogene Houston, MD;  [facility];  Service: Endoscopy;  Laterality: N/A;  . ESOPHAGEAL DILATION N/A 05/04/2016   Procedure: ESOPHAGEAL DILATION;  Surgeon: Rogene Houston, MD;  [facility];  Service: Endoscopy;  Laterality: N/A;  . ESOPHAGEAL DILATION N/A 06/22/2016   Procedure: ESOPHAGEAL DILATION;  Surgeon: Rogene Houston, MD;  [facility];  Service: Endoscopy;  Laterality: N/A;  . ESOPHAGOGASTRODUODENOSCOPY N/A 11/11/2014   Procedure: ESOPHAGOGASTRODUODENOSCOPY (EGD);  Surgeon: Rogene Houston, MD;  [facility];  Service: Endoscopy;  Laterality: N/A;  125-rescheduled 11/11/14 @ 10:30am Ann notified pt  . ESOPHAGOGASTRODUODENOSCOPY (EGD) WITH ESOPHAGEAL DILATION N/A 08/20/2013   Procedure: ESOPHAGOGASTRODUODENOSCOPY (EGD) WITH ESOPHAGEAL DILATION;  Surgeon: Rogene Houston, MD;  [facility];  Service: Endoscopy;  Laterality: N/A;  345-moved to 730 Ann notified pt  . ESOPHAGOGASTRODUODENOSCOPY (EGD) WITH PROPOFOL N/A 07/24/2013   Procedure: ESOPHAGOGASTRODUODENOSCOPY (EGD) WITH PROPOFOL UNDER FLUOROSCOPY;  Surgeon: Rogene Houston, MD;  Location: [facility];  Service: Endoscopy;  Laterality: N/A;  . ESOPHAGOGASTRODUODENOSCOPY (EGD) WITH PROPOFOL N/A 11/30/2014   Procedure: ESOPHAGOGASTRODUODENOSCOPY (EGD) WITH PROPOFOL;  Surgeon: Rogene Houston, MD;  Location: [facility];  Service: Endoscopy;  Laterality: N/A;  fluoro not needed  . ESOPHAGOGASTRODUODENOSCOPY (EGD) WITH PROPOFOL N/A 06/03/2015   Procedure: ESOPHAGOGASTRODUODENOSCOPY (EGD) WITH PROPOFOL; HIATUS AT 35 CM AND GE JUNCTION 40 CM;  Surgeon: Mechele Dawley  Laural Golden, MD;  Location: [facility];  Service: Endoscopy;  Laterality: N/A;  . ESOPHAGOGASTRODUODENOSCOPY (EGD) WITH PROPOFOL N/A 09/09/2015   Procedure: ESOPHAGOGASTRODUODENOSCOPY (EGD) WITH PROPOFOL;  Surgeon: Rogene Houston, MD;  [facility];  Service:  Endoscopy;  Laterality: N/A;  8:15  . ESOPHAGOGASTRODUODENOSCOPY (EGD) WITH PROPOFOL N/A 05/04/2016   Procedure: ESOPHAGOGASTRODUODENOSCOPY (EGD) WITH PROPOFOL;  Surgeon: Rogene Houston, MD;  [facility];  Service: Endoscopy;  Laterality: N/A;  9:30  . ESOPHAGOGASTRODUODENOSCOPY (EGD) WITH PROPOFOL N/A 06/22/2016   Procedure: ESOPHAGOGASTRODUODENOSCOPY (EGD) WITH PROPOFOL;  Surgeon: Rogene Houston, MD;  [facility];  Service: Endoscopy;  Laterality: N/A;  11:20  . INTRAMEDULLARY (IM) NAIL INTERTROCHANTERIC Left 09/18/2015   Procedure: INTRAMEDULLARY (IM) NAIL INTERTROCHANTRIC;  Surgeon: Carole Civil, MD;  Location: [facility];  Service: Orthopedics;  Laterality: Left;  . TUBAL LIGATION      History reviewed. No pertinent family history. Social History:  reports that she has been smoking cigarettes. She has a 55.00 pack-year smoking history. She has never used smokeless tobacco. She reports current alcohol use. She reports that she does not use drugs.  Allergies:  Allergies  Allergen Reactions  . Other Nausea Only    Steroids  . Oxycodone Nausea And Vomiting    Medications Prior to Admission  Medication Sig Dispense Refill  . albuterol (VENTOLIN HFA) 108 (90 Base) MCG/ACT inhaler Inhale 2 puffs into the lungs every 6 (six) hours as needed for wheezing or shortness of breath. 6.7 g 3  . allopurinol (ZYLOPRIM) 100 MG tablet Take 1 tablet by mouth daily.    Marland Kitchen ALPRAZolam (XANAX) 0.25 MG tablet Take 1 tablet (0.25 mg total) by mouth 2 (two) times daily as needed for anxiety. (Patient taking differently: Take 0.125-0.25 mg by mouth 3 (three) times daily as needed for anxiety or sleep. ) 30 tablet 0  . amitriptyline (ELAVIL) 50 MG tablet Take 50 mg by mouth at bedtime.  1  . metoprolol tartrate (LOPRESSOR) 25 MG tablet Take 1 tablet (25 mg total) by mouth 2 (two) times daily. 60 tablet 1  . mirtazapine (REMERON SOL-TAB) 30 MG disintegrating tablet Take 1 tablet by mouth  daily.    . Multiple Vitamin (MULTIVITAMIN PO) Take 1 tablet by mouth daily.    Marland Kitchen omeprazole (PRILOSEC) 40 MG capsule Take 1 capsule by mouth daily.  5  . OXYGEN Inhale 2 L into the lungs at bedtime.    . vitamin C (ASCORBIC ACID) 500 MG tablet Take 500 mg by mouth daily.    . mometasone-formoterol (DULERA) 200-5 MCG/ACT AERO Inhale 1 puff into the lungs 2 (two) times daily. 8.8 g 1    Results for orders placed or performed during the hospital encounter of 05/20/19 (from the past 48 hour(s))  SARS CORONAVIRUS 2 (TAT 6-24 HRS) Nasopharyngeal Nasopharyngeal Swab     Status: None   Collection Time: 05/20/19  7:03 AM   Specimen: Nasopharyngeal Swab  Result Value Ref Range   SARS Coronavirus 2 NEGATIVE NEGATIVE    Comment: (NOTE) SARS-CoV-2 target nucleic acids are NOT DETECTED. The SARS-CoV-2 RNA is generally detectable in upper and lower respiratory specimens during the acute phase of infection. Negative results do not preclude SARS-CoV-2 infection, do not rule out co-infections with other pathogens, and should not be used as the sole basis for treatment or other patient management decisions. Negative results must be combined with clinical observations, patient history, and epidemiological information. The expected result is Negative. Fact Sheet  for Patients: SugarRoll.be Fact Sheet for Healthcare Providers: https://www.woods-mathews.com/ This test is not yet approved or cleared by the [place] FDA and  has been authorized for detection and/or diagnosis of SARS-CoV-2 by FDA under an Emergency Use Authorization (EUA). This EUA will remain  in effect (meaning this test can be used) for the duration of the COVID-19 declaration under Section 56 4(b)(1) of the Act, 21 U.S.C. section 360bbb-3(b)(1), unless the authorization is terminated or revoked sooner. Performed at [facility], [place] [address], [place], [place] [address]    No  results found.  ROS  Blood pressure 136/73, pulse 89, resp. rate 19, weight 41.7 kg, SpO2 94 %. Physical Exam  Constitutional:  Thin Caucasian female with generalized muscle wasting.  HENT:  Patient is edentulous.  Eyes: Conjunctivae are normal. No scleral icterus.  Neck: No thyromegaly present.  Cardiovascular: Normal rate, regular rhythm and normal heart sounds.  Respiratory:  Pactum carinatum. Vesicular breath sounds bilaterally with diminished intensity.  GI:  Abdomen is scaphoid but soft and nontender with organomegaly or masses.  Musculoskeletal:        General: No edema.  Neurological: She is alert.  Skin: Skin is warm and dry.     Assessment/Plan Esophageal dysphagia secondary to known esophageal stricture. Esophagogastroduodenoscopy with esophageal dilation under monitored anesthesia care.  Hildred Laser, MD 05/21/2019, 1:44 PM

## 2019-05-21 NOTE — Transfer of Care (Signed)
Immediate Anesthesia Transfer of Care Note  Patient: Taylor Cannon  Procedure(s) Performed: ESOPHAGOGASTRODUODENOSCOPY (EGD) WITH PROPOFOL (N/A ) ESOPHAGEAL DILATION (N/A )  Patient Location: PACU  Anesthesia Type:General  Level of Consciousness: awake, alert , oriented and patient cooperative  Airway & Oxygen Therapy: Patient Spontanous Breathing  Post-op Assessment: Report given to RN and Post -op Vital signs reviewed and stable  Post vital signs: Reviewed and stable  Last Vitals:  Vitals Value Taken Time  BP 112/65 05/21/19 1411  Temp    Pulse 100 05/21/19 1413  Resp 15 05/21/19 1413  SpO2 96 % 05/21/19 1413  Vitals shown include unvalidated device data.  Last Pain:  Vitals:   05/21/19 1301  TempSrc: Oral  PainSc: 0-No pain         Complications: No apparent anesthesia complications

## 2019-05-21 NOTE — Op Note (Signed)
[facility] Patient Name: Taylor Cannon Procedure Date: 05/21/2019 1:35 PM MRN: [id number] Date of Birth: [birth year] Attending MD: Hildred Laser , MD CSN: [id number] Age: 85 Admit Type: Outpatient Procedure:                Upper GI endoscopy Indications:              Esophageal dysphagia, Follow-up of esophageal                            stenosis, For therapy of esophageal stenosis Providers:                Hildred Laser, MD, Janeece Riggers, RN, Raphael Gibney,                            Technician Referring MD:             Elyn Aquas, MD Medicines:                Propofol per Anesthesia Complications:            No immediate complications. Estimated Blood Loss:     Estimated blood loss was minimal. Procedure:                Pre-Anesthesia Assessment:                           - Prior to the procedure, a History and Physical                            was performed, and patient medications and                            allergies were reviewed. The patient's tolerance of                            previous anesthesia was also reviewed. The risks                            and benefits of the procedure and the sedation                            options and risks were discussed with the patient.                            All questions were answered, and informed consent                            was obtained. Prior Anticoagulants: The patient has                            taken no previous anticoagulant or antiplatelet                            agents. ASA Grade Assessment: IV - A patient with  severe systemic disease that is a constant threat                            to life. After reviewing the risks and benefits,                            the patient was deemed in satisfactory condition to                            undergo the procedure.                           After obtaining informed consent, the endoscope was   passed under direct vision. Throughout the                            procedure, the patient's blood pressure, pulse, and                            oxygen saturations were monitored continuously. The                            [id number]) scope was introduced through the                            mouth, and advanced to the second part of duodenum.                            The upper GI endoscopy was accomplished without                            difficulty. The patient tolerated the procedure                            well. Scope In: 2:56:48 PM Scope Out: 2:04:39 PM Findings:      The examined esophagus was normal.      One benign-appearing, intrinsic severe stenosis was found 30 cm from the       incisors. This stenosis measured less than one cm (in length). The       stenosis was traversed after dilation. A TTS dilator was passed through       the scope. Dilation with a 12-13.5-15 mm balloon dilator was performed       to 12 mm, 13.5 mm and 15 mm. The dilation site was examined and showed       mild mucosal disruption, moderate improvement in luminal narrowing and       no perforation.      An 8 cm hiatal hernia was present.      The entire examined stomach was normal.      Multiple erosions without bleeding were found in the duodenal bulb.      One non-bleeding cratered duodenal ulcer with pigmented material was       found in the duodenal bulb. Impression:               - Normal esophagus.                           -  Benign-appearing esophageal stenosis. Dilated.                           - 8 cm hiatal hernia.                           - Normal stomach.                           - Duodenal erosions without bleeding.                           - Non-bleeding duodenal ulcer with pigmented                            material.                           - No specimens collected. Moderate Sedation:      Per Anesthesia Care Recommendation:           - Patient has a contact  number available for                            emergencies. The signs and symptoms of potential                            delayed complications were discussed with the                            patient. Return to normal activities tomorrow.                            Written discharge instructions were provided to the                            patient.                           - Resume previous diet today.                           - Continue present medications.                           - Change PPI to pantoprazole 40 mg po bid.                           - Check H. Pylori serology.                           - No aspirin, ibuprofen, naproxen, or other                            non-steroidal anti-inflammatory drugs.                           - Repeat upper endoscopy in 4 weeks.  Procedure Code(s):        --- Professional ---                           [phone], Esophagogastroduodenoscopy, flexible,                            transoral; with transendoscopic balloon dilation of                            esophagus (less than 30 mm diameter) Diagnosis Code(s):        --- Professional ---                           K22.2, Esophageal obstruction                           K44.9, Diaphragmatic hernia without obstruction or                            gangrene                           K26.9, Duodenal ulcer, unspecified as acute or                            chronic, without hemorrhage or perforation                           R13.14, Dysphagia, pharyngoesophageal phase CPT copyright 2019 American Medical Association. All rights reserved. The codes documented in this report are preliminary and upon coder review may  be revised to meet current compliance requirements. Hildred Laser, MD Hildred Laser, MD 05/21/2019 2:18:36 PM This report has been signed electronically. Number of Addenda: 0

## 2019-05-21 NOTE — Anesthesia Procedure Notes (Signed)
Procedure Name: General with mask airway Performed by: Adams, Amy A, CRNA Pre-anesthesia Checklist: Timeout performed, Patient being monitored, Suction available, Emergency Drugs available and Patient identified Patient Re-evaluated:Patient Re-evaluated prior to induction Oxygen Delivery Method: Non-rebreather mask       

## 2019-05-21 NOTE — Anesthesia Postprocedure Evaluation (Signed)
Anesthesia Post Note  Patient: Taylor Cannon  Procedure(s) Performed: ESOPHAGOGASTRODUODENOSCOPY (EGD) WITH PROPOFOL (N/A ) ESOPHAGEAL DILATION (N/A )  Patient location during evaluation: PACU Anesthesia Type: General Level of consciousness: awake and alert and oriented Pain management: pain level controlled Vital Signs Assessment: post-procedure vital signs reviewed and stable Respiratory status: spontaneous breathing Cardiovascular status: stable Postop Assessment: no apparent nausea or vomiting Anesthetic complications: no     Last Vitals:  Vitals:   05/21/19 1301 05/21/19 1409  BP: 136/73   Pulse: 89 (P) 94  Resp: 19 (P) 20  Temp:  (P) 36.9 C  SpO2: 94% (P) 97%    Last Pain:  Vitals:   05/21/19 1301  TempSrc: Oral  PainSc: 0-No pain                 [name]

## 2019-05-21 NOTE — Anesthesia Preprocedure Evaluation (Addendum)
Anesthesia Evaluation  Patient identified by MRN, date of birth, ID band Patient awake    Reviewed: Allergy & Precautions, NPO status , Patient's Chart, lab work & pertinent test results, reviewed documented beta blocker date and time   Airway Mallampati: II  TM Distance: >3 FB Neck ROM: Full    Dental  (+) Lower Dentures, Upper Dentures   Pulmonary shortness of breath and with exertion, COPD,  COPD inhaler and oxygen dependent, Current Smoker,    breath sounds clear to auscultation       Cardiovascular Exercise Tolerance: Poor Normal cardiovascular exam Rhythm:Regular Rate:Normal  Took metoprolol - 05/21/19    Neuro/Psych PSYCHIATRIC DISORDERS Anxiety Depression TIA   GI/Hepatic Neg liver ROS, GERD  Medicated,  Endo/Other  malnutrition  Renal/GU negative Renal ROS     Musculoskeletal   Abdominal   Peds  Hematology  (+) anemia ,   Anesthesia Other Findings   Reproductive/Obstetrics                          Anesthesia Physical Anesthesia Plan  ASA: IV  Anesthesia Plan: General   Post-op Pain Management:    Induction: Intravenous  PONV Risk Score and Plan:   Airway Management Planned: Natural Airway and Nasal Cannula  Additional Equipment:   Intra-op Plan:   Post-operative Plan:   Informed Consent: I have reviewed the patients History and Physical, chart, labs and discussed the procedure including the risks, benefits and alternatives for the proposed anesthesia with the patient or authorized representative who has indicated his/her understanding and acceptance.     Dental advisory given  Plan Discussed with: CRNA  Anesthesia Plan Comments:         Anesthesia Quick Evaluation

## 2019-05-22 LAB — H. PYLORI ANTIBODY, IGG: H Pylori IgG: 0.2 Index Value (ref 0.00–0.79)

## 2019-05-25 ENCOUNTER — Encounter (HOSPITAL_COMMUNITY): Payer: Self-pay | Admitting: Internal Medicine

## 2019-05-26 ENCOUNTER — Other Ambulatory Visit: Payer: Self-pay

## 2019-05-26 ENCOUNTER — Telehealth: Payer: Self-pay | Admitting: *Deleted

## 2019-05-26 ENCOUNTER — Ambulatory Visit (INDEPENDENT_AMBULATORY_CARE_PROVIDER_SITE_OTHER): Payer: Medicare HMO | Admitting: Family Medicine

## 2019-05-26 ENCOUNTER — Encounter: Payer: Self-pay | Admitting: Family Medicine

## 2019-05-26 VITALS — BP 120/66 | HR 82 | Temp 97.5°F | Resp 12 | Ht 68.0 in | Wt 90.1 lb

## 2019-05-26 DIAGNOSIS — E43 Unspecified severe protein-calorie malnutrition: Secondary | ICD-10-CM | POA: Diagnosis not present

## 2019-05-26 DIAGNOSIS — E46 Unspecified protein-calorie malnutrition: Secondary | ICD-10-CM

## 2019-05-26 DIAGNOSIS — K219 Gastro-esophageal reflux disease without esophagitis: Secondary | ICD-10-CM | POA: Insufficient documentation

## 2019-05-26 DIAGNOSIS — Z9181 History of falling: Secondary | ICD-10-CM | POA: Insufficient documentation

## 2019-05-26 DIAGNOSIS — M109 Gout, unspecified: Secondary | ICD-10-CM

## 2019-05-26 DIAGNOSIS — R110 Nausea: Secondary | ICD-10-CM

## 2019-05-26 DIAGNOSIS — Z23 Encounter for immunization: Secondary | ICD-10-CM | POA: Diagnosis not present

## 2019-05-26 DIAGNOSIS — G4701 Insomnia due to medical condition: Secondary | ICD-10-CM

## 2019-05-26 DIAGNOSIS — K222 Esophageal obstruction: Secondary | ICD-10-CM

## 2019-05-26 DIAGNOSIS — J441 Chronic obstructive pulmonary disease with (acute) exacerbation: Secondary | ICD-10-CM

## 2019-05-26 DIAGNOSIS — F419 Anxiety disorder, unspecified: Secondary | ICD-10-CM | POA: Insufficient documentation

## 2019-05-26 HISTORY — DX: Gastro-esophageal reflux disease without esophagitis: K21.9

## 2019-05-26 HISTORY — DX: Gout, unspecified: M10.9

## 2019-05-26 HISTORY — DX: Nausea: R11.0

## 2019-05-26 HISTORY — DX: Unspecified protein-calorie malnutrition: E46

## 2019-05-26 MED ORDER — PROMETHAZINE HCL 12.5 MG PO TABS: 12.5000 mg | ORAL_TABLET | Freq: Three times a day (TID) | ORAL | 0 refills | Status: DC | PRN

## 2019-05-26 MED ORDER — ALPRAZOLAM 0.25 MG PO TABS: 0.1250 mg | ORAL_TABLET | Freq: Three times a day (TID) | ORAL | 0 refills | Status: DC | PRN

## 2019-05-26 NOTE — Telephone Encounter (Signed)
Spoke with daughter and she doesn't know which medications her mother needs. I advised her to wait until she needs refills then call us so we can send them into the pharmacy of her choice.

## 2019-05-26 NOTE — Progress Notes (Addendum)
Subjective:     Patient ID: Taylor Cannon, female   DOB: [birth year], 85 y.o.   MRN: [id number]  Taylor Cannon presents for New Patient (Initial Visit) (establish care)  [name] is an 85 year old female patient who was previously seen by a Dr. Salli Real.  Reports that she is in [place].  Reports that she is changed her provider to be here more local in [place] as most of her other providers are in [place].  And if she ever needs to go to the hospital she comes to [facility].   Current history includes anxiety, COPD, depression, GERD, esophageal strictures, home O2 2 L continuously, TIAs, severe malnutrition, ex-smoker-as of 2 weeks back, among others.  In need of updated chart we will look to get records for this.  No longer needs colonoscopy.  Unsure of recent mammogram status.  Needs flu vaccine willing to get today.  Needs pneumonia vaccines as well.  Unsure of last tetanus.  Recently moved in with daughter.  Is currently getting physical therapy at home.  Secondary to her recent hospitalization.  Secondary to acute respiratory failure.  At this time she was started on oxygen continuously.  She does not currently have a pulmonologist.  It like to refrain from seeing another doctor unless they need to.  Currently takes all medications without issue.  Reports that since being out of the hospital though they had stopped taking Elavil.  Currently not on mirtazapine either.  All other medication she was taken regularly.  But she gets these spells where she gets dizzy and then sick on her stomach.  Possibly related to dehydration.  Lifesaving crew was called in the emergency room was notified they reported that they would give her Phenergan and fluids most likely.  She has a great deal of time trying to swallow certain things including pills secondary to the esophageal strictures.  She has had multiple dilations in the past.  Today patient denies signs and symptoms of COVID 19 infection  including fever, chills, cough, shortness of breath, and headache. Past Medical, Surgical, Social History, Allergies, and Medications have been Reviewed.  Past Medical History:  Diagnosis Date  . Anxiety   . COPD (chronic obstructive pulmonary disease) ([place])   . Depression   . GERD (gastroesophageal reflux disease)   . Malnutrition ([place]) 05/26/2019  . On home oxygen therapy    continuous at this time  . Oxygen deficiency   . TIA (transient ischemic attack)    Past Surgical History:  Procedure Laterality Date  . APPENDECTOMY    . BALLOON DILATION N/A 07/24/2013   Procedure: BALLOON DILATION to 13.2mm;  Surgeon: Rogene Houston, MD;  Location: [facility];  Service: Endoscopy;  Laterality: N/A;  . BALLOON DILATION N/A 11/11/2014   Procedure: BALLOON DILATION;  Surgeon: Rogene Houston, MD;  [facility];  Service: Endoscopy;  Laterality: N/A;  . CARDIAC ELECTROPHYSIOLOGY STUDY AND ABLATION    . CATARACT EXTRACTION Bilateral   . CERVICAL CONE BIOPSY    . ESOPHAGEAL DILATION N/A 11/30/2014   Procedure: ESOPHAGEAL DILATION;  Surgeon: Rogene Houston, MD;  Location: [facility];  Service: Endoscopy;  Laterality: N/A;  Balloon, 15, 16.5  . ESOPHAGEAL DILATION N/A 06/03/2015   Procedure: ESOPHAGEAL DILATION WITH 16.5 MM BALLOON ;  Surgeon: Rogene Houston, MD;  Location: [facility];  Service: Endoscopy;  Laterality: N/A;  . ESOPHAGEAL DILATION N/A 09/09/2015   Procedure: ESOPHAGEAL DILATION;  Surgeon: Rogene Houston, MD;  [facility];  Service: Endoscopy;  Laterality: N/A;  . ESOPHAGEAL DILATION N/A 05/04/2016   Procedure: ESOPHAGEAL DILATION;  Surgeon: Rogene Houston, MD;  [facility];  Service: Endoscopy;  Laterality: N/A;  . ESOPHAGEAL DILATION N/A 06/22/2016   Procedure: ESOPHAGEAL DILATION;  Surgeon: Rogene Houston, MD;  [facility];  Service: Endoscopy;  Laterality: N/A;  . ESOPHAGEAL DILATION N/A 05/21/2019   Procedure: ESOPHAGEAL DILATION;  Surgeon:  Rogene Houston, MD;  [facility];  Service: Endoscopy;  Laterality: N/A;  . ESOPHAGOGASTRODUODENOSCOPY N/A 11/11/2014   Procedure: ESOPHAGOGASTRODUODENOSCOPY (EGD);  Surgeon: Rogene Houston, MD;  [facility];  Service: Endoscopy;  Laterality: N/A;  125-rescheduled 11/11/14 @ 10:30am Ann notified pt  . ESOPHAGOGASTRODUODENOSCOPY (EGD) WITH ESOPHAGEAL DILATION N/A 08/20/2013   Procedure: ESOPHAGOGASTRODUODENOSCOPY (EGD) WITH ESOPHAGEAL DILATION;  Surgeon: Rogene Houston, MD;  [facility];  Service: Endoscopy;  Laterality: N/A;  345-moved to 730 Ann notified pt  . ESOPHAGOGASTRODUODENOSCOPY (EGD) WITH PROPOFOL N/A 07/24/2013   Procedure: ESOPHAGOGASTRODUODENOSCOPY (EGD) WITH PROPOFOL UNDER FLUOROSCOPY;  Surgeon: Rogene Houston, MD;  [facility];  Service: Endoscopy;  Laterality: N/A;  . ESOPHAGOGASTRODUODENOSCOPY (EGD) WITH PROPOFOL N/A 11/30/2014   Procedure: ESOPHAGOGASTRODUODENOSCOPY (EGD) WITH PROPOFOL;  Surgeon: Rogene Houston, MD;  [facility]: [facility];  Service: Endoscopy;  Laterality: N/A;  fluoro not needed  . ESOPHAGOGASTRODUODENOSCOPY (EGD) WITH PROPOFOL N/A 06/03/2015   Procedure: ESOPHAGOGASTRODUODENOSCOPY (EGD) WITH PROPOFOL; HIATUS AT 35 CM AND GE JUNCTION 40 CM;  Surgeon: Rogene Houston, MD;  [facility];  Service: Endoscopy;  Laterality: N/A;  . ESOPHAGOGASTRODUODENOSCOPY (EGD) WITH PROPOFOL N/A 09/09/2015   Procedure: ESOPHAGOGASTRODUODENOSCOPY (EGD) WITH PROPOFOL;  Surgeon: Rogene Houston, MD;  [facility];  Service: Endoscopy;  Laterality: N/A;  8:15  . ESOPHAGOGASTRODUODENOSCOPY (EGD) WITH PROPOFOL N/A 05/04/2016   Procedure: ESOPHAGOGASTRODUODENOSCOPY (EGD) WITH PROPOFOL;  Surgeon: Rogene Houston, MD;  [facility];  Service: Endoscopy;  Laterality: N/A;  9:30  . ESOPHAGOGASTRODUODENOSCOPY (EGD) WITH PROPOFOL N/A 06/22/2016   Procedure: ESOPHAGOGASTRODUODENOSCOPY (EGD) WITH PROPOFOL;  Surgeon: Rogene Houston, MD;   [facility];  Service: Endoscopy;  Laterality: N/A;  11:20  . ESOPHAGOGASTRODUODENOSCOPY (EGD) WITH PROPOFOL N/A 05/21/2019   Procedure: ESOPHAGOGASTRODUODENOSCOPY (EGD) WITH PROPOFOL;  Surgeon: Rogene Houston, MD;  [facility];  Service: Endoscopy;  Laterality: N/A;  2:30pm  . INTRAMEDULLARY (IM) NAIL INTERTROCHANTERIC Left 09/18/2015   Procedure: INTRAMEDULLARY (IM) NAIL INTERTROCHANTRIC;  Surgeon: Carole Civil, MD;  Location: [facility];  Service: Orthopedics;  Laterality: Left;  . TUBAL LIGATION     Social History   Socioeconomic History  . Marital status: Divorced    Spouse name: Not on file  . Number of children: 3  . Years of education: Not on file  . Highest education level: 10th grade  Occupational History  . Not on file  Social Needs  . Financial resource strain: Not hard at all  . Food insecurity    Worry: Never true    Inability: Never true  . Transportation needs    Medical: No    Non-medical: No  Tobacco Use  . Smoking status: Former Smoker    Packs/day: 1.00    Years: 55.00    Pack years: 55.00    Types: Cigarettes    Quit date: 05/12/2019    Years since quitting: 0.0  . Smokeless tobacco: Never Used  . Tobacco comment: quit smoking  since 1st of July.  Smoked all her life.   Substance and Sexual Activity  . Alcohol use: Yes    Comment: 2 shots at night  . Drug use: No  . Sexual activity: Never    Birth control/protection: Surgical  Lifestyle  . Physical activity    Days per week: 0 days    Minutes per session: 0 min  . Stress: Very much  Relationships  . Social connections    Talks on phone: More than three times a week    Gets together: More than three times a week    Attends religious service: 1 to 4 times per year    Active member of club or organization: No    Attends meetings of clubs or organizations: Never    Relationship status: Divorced  . Intimate partner violence    Fear of current or ex partner: No     Emotionally abused: No    Physically abused: No    Forced sexual activity: No  Other Topics Concern  . Not on file  Social History Narrative   Lives with daughter    Outpatient Encounter Medications as of 05/26/2019  Medication Sig  . albuterol (VENTOLIN HFA) 108 (90 Base) MCG/ACT inhaler Inhale 2 puffs into the lungs every 6 (six) hours as needed for wheezing or shortness of breath.  . allopurinol (ZYLOPRIM) 100 MG tablet Take 1 tablet by mouth daily.  Marland Kitchen ALPRAZolam (XANAX) 0.25 MG tablet Take 0.5-1 tablets (0.125-0.25 mg total) by mouth 3 (three) times daily as needed for anxiety or sleep.  Marland Kitchen BREO ELLIPTA 200-25 MCG/INH AEPB Inhale 1 puff into the lungs 2 (two) times daily.  . metoprolol tartrate (LOPRESSOR) 25 MG tablet Take 1 tablet (25 mg total) by mouth 2 (two) times daily.  . mirtazapine (REMERON SOL-TAB) 30 MG disintegrating tablet Take 1 tablet by mouth daily.  . mometasone-formoterol (DULERA) 200-5 MCG/ACT AERO Inhale 1 puff into the lungs 2 (two) times daily.  . Multiple Vitamin (MULTIVITAMIN PO) Take 1 tablet by mouth daily.  . OXYGEN Inhale 2 L into the lungs continuous.   . pantoprazole (PROTONIX) 40 MG tablet Take 1 tablet (40 mg total) by mouth 2 (two) times daily before a meal.  . vitamin C (ASCORBIC ACID) 500 MG tablet Take 500 mg by mouth daily.  . [DISCONTINUED] ALPRAZolam (XANAX) 0.25 MG tablet Take 1 tablet (0.25 mg total) by mouth 2 (two) times daily as needed for anxiety. (Patient taking differently: Take 0.125-0.25 mg by mouth 3 (three) times daily as needed for anxiety or sleep. )  . [DISCONTINUED] amitriptyline (ELAVIL) 50 MG tablet Take 50 mg by mouth at bedtime.  . promethazine (PHENERGAN) 12.5 MG tablet Take 1 tablet (12.5 mg total) by mouth every 8 (eight) hours as needed for nausea or vomiting.   No facility-administered encounter medications on file as of 05/26/2019.    Allergies  Allergen Reactions  . Other Nausea Only    Steroids  . Oxycodone Nausea And  Vomiting    Review of Systems  Constitutional: Positive for appetite change. Negative for chills and fever.  HENT: Negative.   Eyes: Negative.   Respiratory: Positive for cough and shortness of breath.   Cardiovascular: Negative.   Gastrointestinal: Positive for nausea.  Endocrine: Negative.   Genitourinary: Negative.   Musculoskeletal: Negative.   Skin: Negative.   Allergic/Immunologic: Negative.   Neurological: Positive for dizziness.  Hematological: Negative.   Psychiatric/Behavioral: Positive for sleep disturbance. The patient is nervous/anxious.  All other systems reviewed and are negative.      Objective:     BP 120/66   Pulse 82   Temp (!) 97.5 F (36.4 C) (Oral)   Resp 12   Ht 5\' 8"  (1.727 m)   Wt 90 lb 1.9 oz (40.9 kg)   SpO2 98% Comment: with 2lpm supplemental oxygen  BMI 13.70 kg/m   Physical Exam Vitals signs and nursing note reviewed.  Constitutional:      Appearance: Normal appearance. She is well-developed and well-groomed. She is cachectic.  HENT:     Head: Normocephalic and atraumatic.     Right Ear: External ear normal.     Left Ear: External ear normal.     Nose: Nose normal.     Mouth/Throat:     Mouth: Mucous membranes are moist.     Pharynx: Oropharynx is clear.  Eyes:     General:        Right eye: No discharge.        Left eye: No discharge.     Conjunctiva/sclera: Conjunctivae normal.  Neck:     Musculoskeletal: Normal range of motion and neck supple.  Cardiovascular:     Rate and Rhythm: Normal rate and regular rhythm.     Pulses: Normal pulses.     Heart sounds: Normal heart sounds.  Pulmonary:     Effort: Pulmonary effort is normal.     Breath sounds: Decreased air movement present.  Musculoskeletal: Normal range of motion.  Skin:    General: Skin is warm.  Neurological:     General: No focal deficit present.     Mental Status: She is alert and oriented to person, place, and time.  Psychiatric:        Attention and  Perception: Attention normal.        Mood and Affect: Mood normal.        Speech: Speech normal.        Behavior: Behavior normal. Behavior is cooperative.        Thought Content: Thought content normal.        Cognition and Memory: Cognition normal.        Judgment: Judgment normal.        Assessment and Plan            1. COPD exacerbation ([place]) Currently COPD seen on x-ray.  Has decreased breath sounds today.  She is on 2 L continuously now since being out of the hospital.  This is helped a lot.  She has still a significant amount of energy loss probably secondary to oxygenation exchange being poor.  Advised to follow-up with a pulmonologist they want to refrain from that for now.  Educated that I will probably ask them again in the future.  I strongly think that the involvement pulmonologist will help produce the best quality of life at this point.  Additionally she has not smoked since leaving the hospital.  She was congratulated on this.  She would like to refrain from ever smoking again.  2. Severe protein-calorie malnutrition ([place]) Significant deficit for protein calorie nutrition.  BMI of 13.  Patient is 5' 8 and weighs 90 pounds.  Daughter reports that this has happened rather quickly over the last couple months to a year.  Reports that sometimes is related to esophageal issues.  Sometimes is related to nausea.  Sometimes she is just not hungry.  Take so much effort for her to eat secondary to the COPD that  it only allows her to eat small portions at a time.  Is not really big on Ensure or boost as they upset her stomach.  Additionally does not want to pure her foods.  Educated and encouraged strongly to either try to find a supplement that allows her to consume it without having upset stomach.  And or think about pureing certain foods.  In addition to restarting the mirtazapine to help see if that will boost some of her hunger even though it is sometimes difficult for her to eat.    3. Need for immunization against influenza Patient was educated on the recommendation for flu vaccine. After obtaining informed consent, the vaccine was administered no adverse effects noted at time of administration. Patient provided with education on arm soreness and use of tylenol or ibuprofen (if safe) for this. Encourage to use the arm vaccine was given in to help reduce the soreness. Patient educated on the signs of a reaction to the vaccine and advised to contact the office should these occur.     - Flu Vaccine QUAD High Dose(Fluad)  4. Nausea Reports having spells of nausea.  Is currently on Protonix.  Has taken Phenergan in the past.  But has run out.  Reports that she can sometimes get dizzy and then she will get nauseated.  But she will be able to take any medications or liquids in.  Secondary to her significant protein/weight deficiency secondary to malnutrition will do low-dose Phenergan as needed every 8 hours.  - promethazine (PHENERGAN) 12.5 MG tablet; Take 1 tablet (12.5 mg total) by mouth every 8 (eight) hours as needed for nausea or vomiting.  Dispense: 15 tablet; Refill: 0  5. Insomnia due to medical condition Reports taking Xanax as needed to sleep well for anxiety.  Has difficulty breathing at times.  Secondary to COPD will continue this at this time.  - ALPRAZolam (XANAX) 0.25 MG tablet; Take 0.5-1 tablets (0.125-0.25 mg total) by mouth 3 (three) times daily as needed for anxiety or sleep.  Dispense: 30 tablet; Refill: 0  6. Anxiety Reports that she takes the Xanax to help with anxiety related to various things such as insomnia, breathing, stress and sleep.  We will refill this.  Educated on this being a source of confusion and fall risk.  Patient does not want to stop taking the medication at this time.  - ALPRAZolam (XANAX) 0.25 MG tablet; Take 0.5-1 tablets (0.125-0.25 mg total) by mouth 3 (three) times daily as needed for anxiety or sleep.  Dispense: 30 tablet; Refill:  0  7. Esophageal stricture Is followed by GI.  Advised to continue following his advice.  Educated to speak with him about that trouble swallowing the Protonix.   Follow-up: 3 months   Perlie Mayo, DNP, AGNP-BC [place], [address] [place], [place] [address] Office Hours: Mon-Thurs 8 am-5 pm; Fri 8 am-12 pm Office Phone:  [phone]  Office Fax: [phone]

## 2019-05-26 NOTE — Telephone Encounter (Signed)
Taylor Cannon Pts daughter called she is trying to get prescriptions sent to [facility] in [place] on [address]. However she called the pharmacy and they told her Jarrett Soho would have to send them in. Pts daughter would like a call back.

## 2019-05-26 NOTE — Patient Instructions (Addendum)
    Thank you for coming into the office today. I appreciate the opportunity to provide you with the care for your health and wellness. Today we discussed: Establish care  Follow up: 3 months  No labs today will look at rechecks at next visit  Goals: 1- Increase protein and fat intake 2- Work on walking, continue PT 3- Restart the Mirtazapine (call if any trouble with this, I can reduce dose if need) 4- Stop Elavil 5- Take phenergan as needed for nausea, and remember that xanax can help dizziness 6- Stay hydrated   Please continue to practice social distancing to keep you, your family, and our community safe.  If you must go out, please wear a Mask and practice good handwashing.  [place] YOUR HANDS WELL AND FREQUENTLY. AVOID TOUCHING YOUR FACE, UNLESS YOUR HANDS ARE FRESHLY WASHED.  GET FRESH AIR DAILY. STAY HYDRATED WITH WATER.   It was a pleasure to see you and I look forward to continuing to work together on your health and well-being. Please do not hesitate to call the office if you need care or have questions about your care.  Have a wonderful day and week. With Gratitude, Cherly Beach, DNP, AGNP-BC

## 2019-05-29 ENCOUNTER — Other Ambulatory Visit (INDEPENDENT_AMBULATORY_CARE_PROVIDER_SITE_OTHER): Payer: Self-pay | Admitting: *Deleted

## 2019-05-29 DIAGNOSIS — R1310 Dysphagia, unspecified: Secondary | ICD-10-CM

## 2019-06-01 ENCOUNTER — Encounter (INDEPENDENT_AMBULATORY_CARE_PROVIDER_SITE_OTHER): Payer: Self-pay | Admitting: *Deleted

## 2019-06-01 DIAGNOSIS — R1310 Dysphagia, unspecified: Secondary | ICD-10-CM | POA: Insufficient documentation

## 2019-06-09 ENCOUNTER — Telehealth: Payer: Self-pay | Admitting: Family Medicine

## 2019-06-09 ENCOUNTER — Other Ambulatory Visit: Payer: Self-pay

## 2019-06-09 MED ORDER — METOPROLOL TARTRATE 25 MG PO TABS: 25.0000 mg | ORAL_TABLET | Freq: Two times a day (BID) | ORAL | 1 refills | Status: DC

## 2019-06-09 NOTE — Telephone Encounter (Signed)
Metoprolol refilled and sent to pharmacy.

## 2019-06-09 NOTE — Telephone Encounter (Signed)
Needs a presciption refilled

## 2019-06-11 ENCOUNTER — Telehealth: Payer: Self-pay | Admitting: Family Medicine

## 2019-06-11 NOTE — Telephone Encounter (Signed)
Taking the medication has made her mouth sore, she needs something to clear it up

## 2019-06-12 NOTE — Telephone Encounter (Signed)
The patients Memory [facility] is making her mouth sore. They stopped the Breo about 4 days ago. When she stopped the [facility] her mouth felt raw. It is feeling better this morning but daughter says in 3 hours she doesn't know how it will feel.

## 2019-06-16 ENCOUNTER — Other Ambulatory Visit: Payer: Self-pay | Admitting: Family Medicine

## 2019-06-16 DIAGNOSIS — B370 Candidal stomatitis: Secondary | ICD-10-CM

## 2019-06-16 MED ORDER — NYSTATIN 100000 UNIT/ML MT SUSP: 5.0000 mL | Freq: Three times a day (TID) | OROMUCOSAL | 1 refills | Status: DC | PRN

## 2019-06-16 NOTE — Progress Notes (Signed)
Patient has oral thrust from use of Breo inhaler. Advised to rinse mouth well after each use. Have sent in nystatin swish for this.

## 2019-06-17 ENCOUNTER — Other Ambulatory Visit: Payer: Self-pay

## 2019-06-17 ENCOUNTER — Encounter (HOSPITAL_COMMUNITY)
Admission: RE | Admit: 2019-06-17 | Discharge: 2019-06-17 | Disposition: A | Discharge status: Home / Self Care | Payer: Medicare HMO | Source: Ambulatory Visit | Attending: Internal Medicine | Admitting: Internal Medicine

## 2019-06-17 ENCOUNTER — Other Ambulatory Visit (HOSPITAL_COMMUNITY)
Admission: RE | Admit: 2019-06-17 | Discharge: 2019-06-17 | Disposition: A | Discharge status: Home / Self Care | Payer: Medicare HMO | Source: Ambulatory Visit | Attending: Internal Medicine | Admitting: Internal Medicine

## 2019-06-17 DIAGNOSIS — Z01812 Encounter for preprocedural laboratory examination: Secondary | ICD-10-CM | POA: Insufficient documentation

## 2019-06-17 DIAGNOSIS — Z20828 Contact with and (suspected) exposure to other viral communicable diseases: Secondary | ICD-10-CM | POA: Diagnosis not present

## 2019-06-17 LAB — SARS CORONAVIRUS 2 (TAT 6-24 HRS): SARS Coronavirus 2: NEGATIVE

## 2019-06-17 NOTE — Telephone Encounter (Signed)
Left message on daughters vm letting her know that Nystatin has been called in for her mother.

## 2019-06-18 ENCOUNTER — Ambulatory Visit (INDEPENDENT_AMBULATORY_CARE_PROVIDER_SITE_OTHER): Payer: Medicare HMO | Admitting: Family Medicine

## 2019-06-18 ENCOUNTER — Other Ambulatory Visit: Payer: Self-pay | Admitting: Family Medicine

## 2019-06-18 ENCOUNTER — Encounter: Payer: Self-pay | Admitting: Family Medicine

## 2019-06-18 ENCOUNTER — Other Ambulatory Visit: Payer: Self-pay

## 2019-06-18 VITALS — BP 120/76 | Resp 17 | Ht 68.0 in | Wt 91.0 lb

## 2019-06-18 DIAGNOSIS — J438 Other emphysema: Secondary | ICD-10-CM

## 2019-06-18 DIAGNOSIS — R7981 Abnormal blood-gas level: Secondary | ICD-10-CM | POA: Diagnosis not present

## 2019-06-18 DIAGNOSIS — Z9981 Dependence on supplemental oxygen: Secondary | ICD-10-CM | POA: Diagnosis not present

## 2019-06-18 DIAGNOSIS — J439 Emphysema, unspecified: Secondary | ICD-10-CM

## 2019-06-18 NOTE — Progress Notes (Signed)
Virtual Visit via Telephone Note   This visit type was conducted due to national recommendations for restrictions regarding the COVID-19 Pandemic (e.g. social distancing) in an effort to limit this patient's exposure and mitigate transmission in our community.  Due to her co-morbid illnesses, this patient is at least at moderate risk for complications without adequate follow up.  This format is felt to be most appropriate for this patient at this time.  The patient did not have access to video technology/had technical difficulties with video requiring transitioning to audio format only (telephone).  All issues noted in this document were discussed and addressed.  No physical exam could be performed with this format.   Evaluation Performed:  Follow-up visit  Date:  06/18/2019   ID:  ZYIA Taylor Cannon, DOB 13-Jun-1934, MRN [id number]  Patient Location: Home Provider Location: Office  Location of Patient: Home Location of Provider: Telehealth Consent was obtain for visit to be over via telehealth. I verified that I am speaking with the correct person using two identifiers.  PCP:  Perlie Mayo, NP   Chief Complaint:  Oxygen level dropping   History of Present Illness:    Taylor Cannon is a 85 y.o. female with history of COPD, on home O2 2 L continuously, depression, GERD, severe malnutrition, ex-smoker as of about a month now since her last visit to the emergency room and hospital stay.  Reports today secondary to having a drop in oxygenation while working with physical therapy at home.  Oxygen dropped to 75% even while on 2 L of oxygen during ambulation.  Daughter reports that she occasionally will drop to 75% intermittently throughout the day without any reason.  Daughter was extremely concerned and worried about what was going on but reports that she did not see any distress in her mother.  Ended up calling the EMS and they came out and upon assessment her vitals were stable and she  was back to a baseline of 98% on 2 L.  She showed no other signs of distress therefore she was not taken to the emergency room.  At last visit I question whether or not she should go to a pulmonologist for a possible referral and work-up for assessment.  I suggested that she think about that and after this scared event she is ready for pulmonology referral.  Additionally daughter reports that she is gained about a pound of weight as she ended up moving in with her mother to help with some of the things that were going on.  She reports that she is seeing a parking her mood and an increase in her appetite and activity.  The patient does not have symptoms concerning for COVID-19 infection (fever, chills, cough, or new shortness of breath).   Past Medical, Surgical, Social History, Allergies, and Medications have been Reviewed.   Past Medical History:  Diagnosis Date   Anxiety    COPD (chronic obstructive pulmonary disease) ([place])    Depression    GERD (gastroesophageal reflux disease)    Malnutrition ([place]) 05/26/2019   On home oxygen therapy    continuous at this time   Oxygen deficiency    TIA (transient ischemic attack)    Past Surgical History:  Procedure Laterality Date   APPENDECTOMY     BALLOON DILATION N/A 07/24/2013   Procedure: BALLOON DILATION to 13.84mm;  Surgeon: Rogene Houston, MD;  Location: [facility];  Service: Endoscopy;  Laterality: N/A;   BALLOON DILATION N/A  11/11/2014   Procedure: BALLOON DILATION;  Surgeon: Rogene Houston, MD;  [facility];  Service: Endoscopy;  Laterality: N/A;   CARDIAC ELECTROPHYSIOLOGY STUDY AND ABLATION     CATARACT EXTRACTION Bilateral    CERVICAL CONE BIOPSY     ESOPHAGEAL DILATION N/A 11/30/2014   Procedure: ESOPHAGEAL DILATION;  Surgeon: Rogene Houston, MD;  Location: [facility];  Service: Endoscopy;  Laterality: N/A;  Balloon, 15, 16.5   ESOPHAGEAL DILATION N/A 06/03/2015   Procedure: ESOPHAGEAL DILATION WITH 16.5 MM  BALLOON ;  Surgeon: Rogene Houston, MD;  Location: [facility];  Service: Endoscopy;  Laterality: N/A;   ESOPHAGEAL DILATION N/A 09/09/2015   Procedure: ESOPHAGEAL DILATION;  Surgeon: Rogene Houston, MD;  [facility];  Service: Endoscopy;  Laterality: N/A;   ESOPHAGEAL DILATION N/A 05/04/2016   Procedure: ESOPHAGEAL DILATION;  Surgeon: Rogene Houston, MD;  [facility];  Service: Endoscopy;  Laterality: N/A;   ESOPHAGEAL DILATION N/A 06/22/2016   Procedure: ESOPHAGEAL DILATION;  Surgeon: Rogene Houston, MD;  [facility];  Service: Endoscopy;  Laterality: N/A;   ESOPHAGEAL DILATION N/A 05/21/2019   Procedure: ESOPHAGEAL DILATION;  Surgeon: Rogene Houston, MD;  [facility];  Service: Endoscopy;  Laterality: N/A;   ESOPHAGOGASTRODUODENOSCOPY N/A 11/11/2014   Procedure: ESOPHAGOGASTRODUODENOSCOPY (EGD);  Surgeon: Rogene Houston, MD;  [facility];  Service: Endoscopy;  Laterality: N/A;  125-rescheduled 11/11/14 @ 10:30am Ann notified pt   ESOPHAGOGASTRODUODENOSCOPY (EGD) WITH ESOPHAGEAL DILATION N/A 08/20/2013   Procedure: ESOPHAGOGASTRODUODENOSCOPY (EGD) WITH ESOPHAGEAL DILATION;  Surgeon: Rogene Houston, MD;  [facility];  Service: Endoscopy;  Laterality: N/A;  345-moved to 730 Ann notified pt   ESOPHAGOGASTRODUODENOSCOPY (EGD) WITH PROPOFOL N/A 07/24/2013   Procedure: ESOPHAGOGASTRODUODENOSCOPY (EGD) WITH PROPOFOL UNDER FLUOROSCOPY;  Surgeon: Rogene Houston, MD;  [facility];  Service: Endoscopy;  Laterality: N/A;   ESOPHAGOGASTRODUODENOSCOPY (EGD) WITH PROPOFOL N/A 11/30/2014   Procedure: ESOPHAGOGASTRODUODENOSCOPY (EGD) WITH PROPOFOL;  Surgeon: Rogene Houston, MD;  [facility];  Service: Endoscopy;  Laterality: N/A;  fluoro not needed   ESOPHAGOGASTRODUODENOSCOPY (EGD) WITH PROPOFOL N/A 06/03/2015   Procedure: ESOPHAGOGASTRODUODENOSCOPY (EGD) WITH PROPOFOL; HIATUS AT 35 CM AND GE JUNCTION 40 CM;  Surgeon: Rogene Houston, MD;  [facility];  Service: Endoscopy;  Laterality: N/A;   ESOPHAGOGASTRODUODENOSCOPY (EGD) WITH PROPOFOL N/A 09/09/2015   Procedure: ESOPHAGOGASTRODUODENOSCOPY (EGD) WITH PROPOFOL;  Surgeon: Rogene Houston, MD;  [facility];  Service: Endoscopy;  Laterality: N/A;  8:15   ESOPHAGOGASTRODUODENOSCOPY (EGD) WITH PROPOFOL N/A 05/04/2016   Procedure: ESOPHAGOGASTRODUODENOSCOPY (EGD) WITH PROPOFOL;  Surgeon: Rogene Houston, MD;  [facility];  Service: Endoscopy;  Laterality: N/A;  9:30   ESOPHAGOGASTRODUODENOSCOPY (EGD) WITH PROPOFOL N/A 06/22/2016   Procedure: ESOPHAGOGASTRODUODENOSCOPY (EGD) WITH PROPOFOL;  Surgeon: Rogene Houston, MD;  [facility];  Service: Endoscopy;  Laterality: N/A;  11:20   ESOPHAGOGASTRODUODENOSCOPY (EGD) WITH PROPOFOL N/A 05/21/2019   Procedure: ESOPHAGOGASTRODUODENOSCOPY (EGD) WITH PROPOFOL;  Surgeon: Rogene Houston, MD;  [facility];  Service: Endoscopy;  Laterality: N/A;  2:30pm   INTRAMEDULLARY (IM) NAIL INTERTROCHANTERIC Left 09/18/2015   Procedure: INTRAMEDULLARY (IM) NAIL INTERTROCHANTRIC;  Surgeon: Carole Civil, MD;  Location: [facility];  Service: Orthopedics;  Laterality: Left;   TUBAL LIGATION       No outpatient medications have been marked as taking for the 06/18/19 encounter (Office Visit) with Cherly Beach  M, NP.     Allergies:   Other and Oxycodone   Social History   Tobacco Use   Smoking status: Former Smoker    Packs/day: 1.00    Years: 55.00    Pack years: 55.00    Types: Cigarettes    Quit date: 05/12/2019    Years since quitting: 0.1   Smokeless tobacco: Never Used   Tobacco comment: quit smoking since 1st of July.  Smoked all her life.   Substance Use Topics   Alcohol use: Yes    Comment: 2 shots at night   Drug use: No     Family Hx: The patient's family history is not on file.  ROS:   Please see the history of present illness.    All other systems reviewed and  are negative.   Labs/Other Tests and Data Reviewed:   Recent Labs: 05/12/2019: B Natriuretic Peptide 69.0; TSH 2.482 05/13/2019: ALT 21 05/15/2019: BUN 12; Creatinine, Ser 0.56; Hemoglobin 11.6; Magnesium 1.8; Platelets 293; Potassium 4.3; Sodium 134   Recent Lipid Panel No results found for: CHOL, TRIG, HDL, CHOLHDL, LDLCALC, LDLDIRECT  Wt Readings from Last 3 Encounters:  06/18/19 91 lb (41.3 kg)  05/26/19 90 lb 1.9 oz (40.9 kg)  05/21/19 92 lb (41.7 kg)     Objective:    Vital Signs:  BP 120/76    Resp 17    Ht 5\' 8"  (1.727 m)    Wt 91 lb (41.3 kg)    SpO2 98% Comment: post EMS arrival recovery from working with PT   BMI 13.84 kg/m    VITAL SIGNS:  reviewed GEN:  alert and oriented  RESPIRATORY:  no shortness of breath in conversation  PSYCH:  normal mood and affect  ASSESSMENT & PLAN:    1. Pulmonary emphysema, unspecified emphysema type ([place]) Increased work of breathing with walking with physical therapy.  On 2 L of oxygen via nasal cannula.  She has been on this continuously since being out of the hospital.  Prior to me meeting her.  She reported a significant amount of energy loss at our previous visit.  Especially with weight loss.  BMI of 13.84.  Daughter reports that she ended up moving in with her because of her depression-like symptoms.  And she has seen an increase in her activity and her overall wellness and eating.  Has gained a pound since I last saw her.  At the last visit I had discussed possible need for pulmonology referral.  Daughter declined at that time.  After today's scare she refers to me to place that referral and at least have an evaluation by pulmonologist to make sure that she is getting the care that she needs.  Extensive education provided about use of rescue inhaler before activity.  In addition to as needed.  Use of Breo properly with proper rinsing.  Have prescribed nystatin to help with this because she has developed thrush.  Additionally have  suggested the increase of oxygen with her therapy to help make sure that she can maintain an oxygen level as she does not appear to have much reserve.  2. Requires continuous at home supplemental oxygen Is on home O2.  Reported that her oxygen a drop to 75% when she was working with physical therapy.  She was walking with her 2 L at that time.  Advised for her to bump up her oxygen a liter or 2 when she is working with physical therapy to maintain at least an  88% oxygen saturation.  And that it was okay if she did drop to 85% as long as she recovered well.  It is not uncommon for her to drop to 75% per her daughter.  Advised that as long as she rebounds and she does not show signs and symptoms of distress or respiratory failure or if she is maintaining the 75% for longer than a few minutes that she would be okay.  Advised for her if those were seen that she would need to call 911.  And increased oxygen at that time.  3. Low oxygen saturation Daughter reported that her oxygen dropped to 75% when she was walking with physical therapy.  She was walked on her 2 L of normal baseline home oxygenation supplement.  Secondary to pulmonary emphysema COPD she probably has no reserve therefore she probably needs to have an increase in oxygenation when she is doing her therapy.  This was explained to her daughter [name].  Advised for her to bump up her oxygen by a liter or 2 when she is working with physical therapy to maintain an oxygen saturation of at least 88% allowing her to recover post ambulation and therapy.   Time:   Today, I have spent 13 minutes with the patient with telehealth technology discussing the above problems.     Medication Adjustments/Labs and Tests Ordered: Current medicines are reviewed at length with the patient today.  Concerns regarding medicines are outlined above.   Tests Ordered: Orders Placed This Encounter  Procedures   Ambulatory referral to Pulmonology    Medication  Changes: No orders of the defined types were placed in this encounter.   Disposition:  Follow up 08/25/2019  Signed, Perlie Mayo, NP  06/18/2019 3:36 PM     [facility]

## 2019-06-18 NOTE — Patient Instructions (Signed)
  I appreciate the opportunity to provide you with the care for your health and wellness. Today we discussed: oxygen dropping with therapy and at random times  Follow up: 08/25/2019 No labs  Referral for Pulmonary Made  Oxygen Goals: Prior to therapy or exercise of any form use the albuterol inhaler 15-30 minutes beforehand. This can help with oxygen exchange some and help her breath better. When ambulating or working with therapy increase oxygen level to 3-4L for a goal of at least 88%. There is an expected drop in oxygen with patients that have COPD. Most patients with COPD do not have oxygen reserve, this causes increase work of breathing and drops in the oxygen levels. While this is not normal for all, in COPD this can be an expected finding.  So turn the oxygen up a liter or 2 to help with supplementing to balance the oxygen back.  Allow recovery time of 3-5 minutes and then turn oxygen back to 2L.  Check oxygen when you notice shortness of breath or increase work of breathing, Otherwise, twice a day check will be okay in morning and at night will be good. Goal is at least 88% or better.  It was a pleasure to see you and I look forward to continuing to work together on your health and well-being. Please do not hesitate to call the office if you need care or have questions about your care.  Have a wonderful day and week. With Gratitude, Cherly Beach, DNP, AGNP-BC

## 2019-06-18 NOTE — Telephone Encounter (Signed)
Patients daughter called and stated that the physical therapist was there and he stated she probably needs an inhaler because her oxygen is dropping down to 75% when she is walking. Patient is on supplemental oxygen at 2lpm. Daughter stated he walked her up and down the hall 6 times and that is when it happened. I tried to explained to the daughter that it isn't normal for a patient's oxygen to drop that low at any point, and that is why she has oxygen, to keep hers from dropping that low. The daughter is resistant to taking her to the ED because she says it doesn't do it all the time. It only did it this time because she had been walked up and down the hall 6 times. Please advise.

## 2019-06-18 NOTE — Telephone Encounter (Signed)
Please call this number [phone] if you cant reach her on the other

## 2019-06-18 NOTE — Telephone Encounter (Signed)
They are taken the pt to the [facility]

## 2019-06-19 ENCOUNTER — Ambulatory Visit (HOSPITAL_COMMUNITY): Payer: Medicare HMO | Admitting: Anesthesiology

## 2019-06-19 ENCOUNTER — Ambulatory Visit (HOSPITAL_COMMUNITY)
Admission: RE | Admit: 2019-06-19 | Discharge: 2019-06-19 | Disposition: A | Discharge status: Home / Self Care | Payer: Medicare HMO | Attending: Internal Medicine | Admitting: Internal Medicine

## 2019-06-19 ENCOUNTER — Encounter (HOSPITAL_COMMUNITY)
Admission: RE | Disposition: A | Discharge status: Home / Self Care | Payer: Self-pay | Source: Home / Self Care | Attending: Internal Medicine

## 2019-06-19 ENCOUNTER — Encounter (HOSPITAL_COMMUNITY): Payer: Self-pay | Admitting: *Deleted

## 2019-06-19 DIAGNOSIS — Z87891 Personal history of nicotine dependence: Secondary | ICD-10-CM | POA: Insufficient documentation

## 2019-06-19 DIAGNOSIS — J449 Chronic obstructive pulmonary disease, unspecified: Secondary | ICD-10-CM | POA: Diagnosis not present

## 2019-06-19 DIAGNOSIS — K449 Diaphragmatic hernia without obstruction or gangrene: Secondary | ICD-10-CM | POA: Diagnosis not present

## 2019-06-19 DIAGNOSIS — Z885 Allergy status to narcotic agent status: Secondary | ICD-10-CM | POA: Diagnosis not present

## 2019-06-19 DIAGNOSIS — F329 Major depressive disorder, single episode, unspecified: Secondary | ICD-10-CM | POA: Diagnosis not present

## 2019-06-19 DIAGNOSIS — Z79899 Other long term (current) drug therapy: Secondary | ICD-10-CM | POA: Diagnosis not present

## 2019-06-19 DIAGNOSIS — Z8673 Personal history of transient ischemic attack (TIA), and cerebral infarction without residual deficits: Secondary | ICD-10-CM | POA: Insufficient documentation

## 2019-06-19 DIAGNOSIS — Z888 Allergy status to other drugs, medicaments and biological substances status: Secondary | ICD-10-CM | POA: Diagnosis not present

## 2019-06-19 DIAGNOSIS — Z681 Body mass index (BMI) 19 or less, adult: Secondary | ICD-10-CM | POA: Insufficient documentation

## 2019-06-19 DIAGNOSIS — R1310 Dysphagia, unspecified: Secondary | ICD-10-CM | POA: Insufficient documentation

## 2019-06-19 DIAGNOSIS — F419 Anxiety disorder, unspecified: Secondary | ICD-10-CM | POA: Insufficient documentation

## 2019-06-19 DIAGNOSIS — E46 Unspecified protein-calorie malnutrition: Secondary | ICD-10-CM | POA: Diagnosis not present

## 2019-06-19 DIAGNOSIS — Z7951 Long term (current) use of inhaled steroids: Secondary | ICD-10-CM | POA: Diagnosis not present

## 2019-06-19 DIAGNOSIS — K219 Gastro-esophageal reflux disease without esophagitis: Secondary | ICD-10-CM | POA: Insufficient documentation

## 2019-06-19 DIAGNOSIS — Z9981 Dependence on supplemental oxygen: Secondary | ICD-10-CM | POA: Diagnosis not present

## 2019-06-19 DIAGNOSIS — K222 Esophageal obstruction: Secondary | ICD-10-CM | POA: Diagnosis not present

## 2019-06-19 DIAGNOSIS — R1314 Dysphagia, pharyngoesophageal phase: Secondary | ICD-10-CM

## 2019-06-19 HISTORY — PX: ESOPHAGEAL DILATION: SHX303

## 2019-06-19 HISTORY — PX: ESOPHAGOGASTRODUODENOSCOPY (EGD) WITH PROPOFOL: SHX5813

## 2019-06-19 SURGERY — ESOPHAGOGASTRODUODENOSCOPY (EGD) WITH PROPOFOL
Anesthesia: General

## 2019-06-19 MED ORDER — KETAMINE HCL 10 MG/ML IJ SOLN
INTRAMUSCULAR | Status: DC | PRN
  Administered 2019-06-19: 10 mg via INTRAVENOUS

## 2019-06-19 MED ORDER — LIDOCAINE HCL (CARDIAC) PF 100 MG/5ML IV SOSY
PREFILLED_SYRINGE | INTRAVENOUS | Status: DC | PRN
  Administered 2019-06-19: 20 mg via INTRAVENOUS

## 2019-06-19 MED ORDER — PROPOFOL 500 MG/50ML IV EMUL
INTRAVENOUS | Status: DC | PRN
  Administered 2019-06-19: 150 ug/kg/min via INTRAVENOUS

## 2019-06-19 MED ORDER — PROMETHAZINE HCL 25 MG/ML IJ SOLN: 6.2500 mg | INTRAMUSCULAR | Status: DC | PRN

## 2019-06-19 MED ORDER — CHLORHEXIDINE GLUCONATE CLOTH 2 % EX PADS: 6.0000 | MEDICATED_PAD | Freq: Once | CUTANEOUS | Status: DC

## 2019-06-19 MED ORDER — LACTATED RINGERS IV SOLN
INTRAVENOUS | Status: DC
  Administered 2019-06-19: 14:00:00 via INTRAVENOUS

## 2019-06-19 MED ORDER — KETAMINE HCL 50 MG/5ML IJ SOSY
PREFILLED_SYRINGE | INTRAMUSCULAR | Status: AC
  Filled 2019-06-19: qty 5

## 2019-06-19 MED ORDER — MIDAZOLAM HCL 2 MG/2ML IJ SOLN: 0.5000 mg | Freq: Once | INTRAMUSCULAR | Status: DC | PRN

## 2019-06-19 MED ORDER — PROPOFOL 10 MG/ML IV BOLUS
INTRAVENOUS | Status: AC
  Filled 2019-06-19: qty 20

## 2019-06-19 MED ORDER — TRIAMCINOLONE ACETONIDE 10 MG/ML IJ SUSP
10.0000 mg | Freq: Once | INTRAMUSCULAR | Status: DC
  Filled 2019-06-19: qty 1

## 2019-06-19 MED ORDER — PROPOFOL 10 MG/ML IV BOLUS
INTRAVENOUS | Status: DC | PRN
  Administered 2019-06-19: 20 mg via INTRAVENOUS

## 2019-06-19 MED ORDER — TRIAMCINOLONE ACETONIDE 40 MG/ML IJ SUSP
INTRAMUSCULAR | Status: DC | PRN
  Administered 2019-06-19: 40 mg

## 2019-06-19 NOTE — Discharge Instructions (Signed)
No aspirin or NSAIDs for 72 hours.   Resume usual medications as before. Soft diet for 24 hours and that after usual diet. No driving for 24 hours. Please call office with progress report in 1 week.

## 2019-06-19 NOTE — Anesthesia Preprocedure Evaluation (Signed)
Anesthesia Evaluation  Patient identified by MRN, date of birth, ID band Patient awake    Reviewed: Allergy & Precautions, NPO status , Patient's Chart, lab work & pertinent test results  Airway Mallampati: I  TM Distance: >3 FB Neck ROM: Full    Dental no notable dental hx. (+) Edentulous Upper, Edentulous Lower   Pulmonary COPD,  COPD inhaler and oxygen dependent, former smoker,    Pulmonary exam normal breath sounds clear to auscultation       Cardiovascular Exercise Tolerance: Poor negative cardio ROS Normal cardiovascular examII Rhythm:Regular Rate:Normal     Neuro/Psych Anxiety Depression TIAnegative psych ROS   GI/Hepatic Neg liver ROS, GERD  Medicated and Controlled,HERE for EGD/Poss dilation  Malnourished -BMI <14   Endo/Other  negative endocrine ROS  Renal/GU negative Renal ROS  negative genitourinary   Musculoskeletal negative musculoskeletal ROS (+)   Abdominal   Peds negative pediatric ROS (+)  Hematology negative hematology ROS (+) anemia ,   Anesthesia Other Findings   Reproductive/Obstetrics negative OB ROS                             Anesthesia Physical Anesthesia Plan  ASA: IV  Anesthesia Plan: General   Post-op Pain Management:    Induction: Intravenous  PONV Risk Score and Plan: 3 and TIVA, Propofol infusion, Ondansetron and Treatment may vary due to age or medical condition  Airway Management Planned: Nasal Cannula and Simple Face Mask  Additional Equipment:   Intra-op Plan:   Post-operative Plan:   Informed Consent: I have reviewed the patients History and Physical, chart, labs and discussed the procedure including the risks, benefits and alternatives for the proposed anesthesia with the patient or authorized representative who has indicated his/her understanding and acceptance.     Dental advisory given  Plan Discussed with: CRNA  Anesthesia  Plan Comments: (Plan Full PPE use  Plan GA with GETA as needed d/w pt -WTP with same after Q&A)        Anesthesia Quick Evaluation

## 2019-06-19 NOTE — H&P (Signed)
Taylor Cannon is an 85 y.o. female.   Chief Complaint: Patient is here for esophagogastroduodenoscopy and esophageal dilation. HPI: Patient is an 85 year old Caucasian female with known chronic GERD and large hiatal hernia complicated by distal esophageal stricture which has been refractory to therapy.  She is required multiple dilations.  The stricture was last dilated on 917 2020 to 15 mm.  And is returning for repeat dilation hoping to be carried to 16.5 mm.  She says her swallowing has been much better since last dilation.  She has not had any episode of food impaction.  She is not sure if she has gained any weight.  She has quit cigarette smoking.  She does not take aspirin or anticoagulants.  Past Medical History:  Diagnosis Date  . Anxiety   . COPD (chronic obstructive pulmonary disease) ([place])   . Depression   . GERD (gastroesophageal reflux disease)   . Malnutrition ([place]) 05/26/2019  . On home oxygen therapy    continuous at this time  . Oxygen deficiency   . TIA (transient ischemic attack)     Past Surgical History:  Procedure Laterality Date  . APPENDECTOMY    . BALLOON DILATION N/A 07/24/2013   Procedure: BALLOON DILATION to 13.57mm;  Surgeon: Rogene Houston, MD;  Location: [facility];  Service: Endoscopy;  Laterality: N/A;  . BALLOON DILATION N/A 11/11/2014   Procedure: BALLOON DILATION;  Surgeon: Rogene Houston, MD;  [facility];  Service: Endoscopy;  Laterality: N/A;  . CARDIAC ELECTROPHYSIOLOGY STUDY AND ABLATION    . CATARACT EXTRACTION Bilateral   . CERVICAL CONE BIOPSY    . ESOPHAGEAL DILATION N/A 11/30/2014   Procedure: ESOPHAGEAL DILATION;  Surgeon: Rogene Houston, MD;  Location: [facility];  Service: Endoscopy;  Laterality: N/A;  Balloon, 15, 16.5  . ESOPHAGEAL DILATION N/A 06/03/2015   Procedure: ESOPHAGEAL DILATION WITH 16.5 MM BALLOON ;  Surgeon: Rogene Houston, MD;  Location: [facility];  Service: Endoscopy;  Laterality: N/A;  . ESOPHAGEAL DILATION N/A  09/09/2015   Procedure: ESOPHAGEAL DILATION;  Surgeon: Rogene Houston, MD;  [facility];  Service: Endoscopy;  Laterality: N/A;  . ESOPHAGEAL DILATION N/A 05/04/2016   Procedure: ESOPHAGEAL DILATION;  Surgeon: Rogene Houston, MD;  [facility];  Service: Endoscopy;  Laterality: N/A;  . ESOPHAGEAL DILATION N/A 06/22/2016   Procedure: ESOPHAGEAL DILATION;  Surgeon: Rogene Houston, MD;  [facility];  Service: Endoscopy;  Laterality: N/A;  . ESOPHAGEAL DILATION N/A 05/21/2019   Procedure: ESOPHAGEAL DILATION;  Surgeon: Rogene Houston, MD;  [facility];  Service: Endoscopy;  Laterality: N/A;  . ESOPHAGOGASTRODUODENOSCOPY N/A 11/11/2014   Procedure: ESOPHAGOGASTRODUODENOSCOPY (EGD);  Surgeon: Rogene Houston, MD;  [facility];  Service: Endoscopy;  Laterality: N/A;  125-rescheduled 11/11/14 @ 10:30am Ann notified pt  . ESOPHAGOGASTRODUODENOSCOPY (EGD) WITH ESOPHAGEAL DILATION N/A 08/20/2013   Procedure: ESOPHAGOGASTRODUODENOSCOPY (EGD) WITH ESOPHAGEAL DILATION;  Surgeon: Rogene Houston, MD;  [facility];  Service: Endoscopy;  Laterality: N/A;  345-moved to 730 Ann notified pt  . ESOPHAGOGASTRODUODENOSCOPY (EGD) WITH PROPOFOL N/A 07/24/2013   Procedure: ESOPHAGOGASTRODUODENOSCOPY (EGD) WITH PROPOFOL UNDER FLUOROSCOPY;  Surgeon: Rogene Houston, MD;  [facility];  Service: Endoscopy;  Laterality: N/A;  . ESOPHAGOGASTRODUODENOSCOPY (EGD) WITH PROPOFOL N/A 11/30/2014   Procedure: ESOPHAGOGASTRODUODENOSCOPY (EGD) WITH PROPOFOL;  Surgeon: Rogene Houston, MD;  [facility]: [facility];  Service: Endoscopy;  Laterality: N/A;  fluoro not needed  .  ESOPHAGOGASTRODUODENOSCOPY (EGD) WITH PROPOFOL N/A 06/03/2015   Procedure: ESOPHAGOGASTRODUODENOSCOPY (EGD) WITH PROPOFOL; HIATUS AT 35 CM AND GE JUNCTION 40 CM;  Surgeon: Rogene Houston, MD;  [facility]: [facility];  Service: Endoscopy;  Laterality: N/A;  . ESOPHAGOGASTRODUODENOSCOPY (EGD) WITH PROPOFOL N/A  09/09/2015   Procedure: ESOPHAGOGASTRODUODENOSCOPY (EGD) WITH PROPOFOL;  Surgeon: Rogene Houston, MD;  [facility];  Service: Endoscopy;  Laterality: N/A;  8:15  . ESOPHAGOGASTRODUODENOSCOPY (EGD) WITH PROPOFOL N/A 05/04/2016   Procedure: ESOPHAGOGASTRODUODENOSCOPY (EGD) WITH PROPOFOL;  Surgeon: Rogene Houston, MD;  [facility];  Service: Endoscopy;  Laterality: N/A;  9:30  . ESOPHAGOGASTRODUODENOSCOPY (EGD) WITH PROPOFOL N/A 06/22/2016   Procedure: ESOPHAGOGASTRODUODENOSCOPY (EGD) WITH PROPOFOL;  Surgeon: Rogene Houston, MD;  [facility];  Service: Endoscopy;  Laterality: N/A;  11:20  . ESOPHAGOGASTRODUODENOSCOPY (EGD) WITH PROPOFOL N/A 05/21/2019   Procedure: ESOPHAGOGASTRODUODENOSCOPY (EGD) WITH PROPOFOL;  Surgeon: Rogene Houston, MD;  [facility];  Service: Endoscopy;  Laterality: N/A;  2:30pm  . INTRAMEDULLARY (IM) NAIL INTERTROCHANTERIC Left 09/18/2015   Procedure: INTRAMEDULLARY (IM) NAIL INTERTROCHANTRIC;  Surgeon: Carole Civil, MD;  Location: [facility];  Service: Orthopedics;  Laterality: Left;  . TUBAL LIGATION      History reviewed. No pertinent family history. Social History:  reports that she quit smoking about 5 weeks ago. Her smoking use included cigarettes. She has a 55.00 pack-year smoking history. She has never used smokeless tobacco. She reports current alcohol use. She reports that she does not use drugs.  Allergies:  Allergies  Allergen Reactions  . Other Nausea Only    Steroids  . Oxycodone Nausea And Vomiting    Medications Prior to Admission  Medication Sig Dispense Refill  . albuterol (VENTOLIN HFA) 108 (90 Base) MCG/ACT inhaler Inhale 2 puffs into the lungs every 6 (six) hours as needed for wheezing or shortness of breath. 6.7 g 3  . allopurinol (ZYLOPRIM) 100 MG tablet Take 100 mg by mouth daily.     Marland Kitchen ALPRAZolam (XANAX) 0.25 MG tablet Take 0.5-1 tablets (0.125-0.25 mg total) by mouth 3 (three) times daily as  needed for anxiety or sleep. 30 tablet 0  . BREO ELLIPTA 200-25 MCG/INH AEPB     . Docusate Calcium (STOOL SOFTENER PO) Take 1 tablet by mouth daily.    . metoprolol tartrate (LOPRESSOR) 25 MG tablet Take 1 tablet (25 mg total) by mouth 2 (two) times daily. 60 tablet 1  . mirtazapine (REMERON SOL-TAB) 30 MG disintegrating tablet     . mirtazapine (REMERON) 30 MG tablet Take 30 mg by mouth at bedtime.     . mometasone-formoterol (DULERA) 200-5 MCG/ACT AERO Inhale 1 puff into the lungs 2 (two) times daily. (Patient taking differently: Inhale 1 puff into the lungs 2 (two) times daily as needed for wheezing or shortness of breath. ) 8.8 g 1  . Multiple Vitamin (MULTIVITAMIN PO) Take 1 tablet by mouth daily.    Marland Kitchen nystatin (MYCOSTATIN) 100000 UNIT/ML suspension Take 5 mLs (500,000 Units total) by mouth 3 (three) times daily as needed. 60 mL 1  . OXYGEN Inhale 2 L into the lungs continuous.     . pantoprazole (PROTONIX) 40 MG tablet Take 1 tablet (40 mg total) by mouth 2 (two) times daily before a meal. 60 tablet 5  . promethazine (PHENERGAN) 12.5 MG tablet Take 1 tablet (12.5 mg total) by mouth every 8 (eight) hours as needed for nausea or vomiting. 15 tablet 0  . vitamin  C (ASCORBIC ACID) 250 MG tablet Take 500 mg by mouth daily.       No results found for this or any previous visit (from the past 48 hour(s)). No results found.  ROS  Blood pressure 126/78, pulse 86, temperature 97.6 F (36.4 C), temperature source Oral, resp. rate 18, height 5\' 8"  (1.727 m), weight 41.3 kg, SpO2 99 %. Physical Exam  Constitutional:  Patient is thin with generalized wasting.  HENT:  Mouth/Throat: Oropharynx is clear and moist.  Eyes: Conjunctivae are normal. No scleral icterus.  Neck: No thyromegaly present.  Cardiovascular: Normal rate, regular rhythm and normal heart sounds.  No murmur heard. Respiratory: Effort normal and breath sounds normal.  GI:  Abdomen is scaphoid.  Is soft and nontender with  organomegaly or masses.  Musculoskeletal:     Comments: Very thin extremities without edema.  Lymphadenopathy:    She has no cervical adenopathy.  Neurological: She is alert.  Skin: Skin is warm and dry.     Assessment/Plan Esophageal stricture in a patient with known large hiatal hernia. Esophagogastroduodenoscopy with esophageal dilation and intralesional Kenalog injection.  Hildred Laser, MD 06/19/2019, 1:55 PM

## 2019-06-19 NOTE — Op Note (Signed)
[facility] Patient Name: Taylor Cannon Procedure Date: 06/19/2019 1:49 PM MRN: [id number] Date of Birth: 19-Aug-1934 Attending MD: Hildred Laser , MD CSN: [id number] Age: 85 Admit Type: Outpatient Procedure:                Upper GI endoscopy Indications:              Esophageal dysphagia, For therapy of esophageal                            stenosis Providers:                Hildred Laser, MD, Lurline Del, RN, Raphael Gibney,                            Technician Referring MD:             Vale Haven, NP Medicines:                Propofol per Anesthesia Complications:            No immediate complications. Estimated Blood Loss:     Estimated blood loss was minimal. Procedure:                Pre-Anesthesia Assessment:                           - Prior to the procedure, a History and Physical                            was performed, and patient medications and                            allergies were reviewed. The patient's tolerance of                            previous anesthesia was also reviewed. The risks                            and benefits of the procedure and the sedation                            options and risks were discussed with the patient.                            All questions were answered, and informed consent                            was obtained. Prior Anticoagulants: The patient has                            taken no previous anticoagulant or antiplatelet                            agents. ASA Grade Assessment: IV - A patient with  severe systemic disease that is a constant threat                            to life. After reviewing the risks and benefits,                            the patient was deemed in satisfactory condition to                            undergo the procedure.                           After obtaining informed consent, the endoscope was                            passed under direct vision. Throughout  the                            procedure, the patient's blood pressure, pulse, and                            oxygen saturations were monitored continuously. The                            [id number]) was introduced through the                            mouth, and advanced to the second part of duodenum.                            The upper GI endoscopy was accomplished without                            difficulty. The patient tolerated the procedure                            well. Scope In: 2:11:20 PM Scope Out: 2:24:14 PM Total Procedure Duration: 0 hours 12 minutes 54 seconds  Findings:      The examined esophagus was normal.      One benign-appearing, intrinsic moderate stenosis was found 30 cm from       the incisors. This stenosis measured 1 cm (inner diameter) x less than       one cm (in length). The stenosis was traversed. Area was successfully       injected with 4 mL of triamcinolone (10 mg/mL) for drug delivery.      A TTS dilator was passed through the scope. Dilation with a 15-16.5-18       mm balloon dilator was performed to 16.5 mm and 18 mm at the       gastroesophageal junction resulting in mucosal disruption.      A 9 cm hiatal hernia was present.      The entire examined stomach was normal.      The duodenal bulb and second portion of the duodenum were normal. Impression:               -  Normal esophagus.                           - Benign-appearing esophageal stenosis. Injected                            with triamcinolone 10 mg x 4 and then dilated with                            balloon to 16.5 mm.                           - 9 cm hiatal hernia.                           - Normal stomach.                           - Normal duodenal bulb and second portion of the                            duodenum.                           - Dilation performed at the gastroesophageal                            junction.                           - No specimens  collected.                           Comment: no resistance noted across the stricture                            post-dilation. Moderate Sedation:      Per Anesthesia Care Recommendation:           - Patient has a contact number available for                            emergencies. The signs and symptoms of potential                            delayed complications were discussed with the                            patient. Return to normal activities tomorrow.                            Written discharge instructions were provided to the                            patient.                           - Mechanical soft diet today.                           -  Resume previous diet for 1 day.                           - Continue present medications.                           - No aspirin, ibuprofen, naproxen, or other                            non-steroidal anti-inflammatory drugs for 3 days.                           - Telephone GI clinic in 1 week. Procedure Code(s):        --- Professional ---                           [phone], Esophagogastroduodenoscopy, flexible,                            transoral; with transendoscopic balloon dilation of                            esophagus (less than 30 mm diameter) Diagnosis Code(s):        --- Professional ---                           K22.2, Esophageal obstruction                           K44.9, Diaphragmatic hernia without obstruction or                            gangrene                           R13.14, Dysphagia, pharyngoesophageal phase CPT copyright 2019 [employer]. All rights reserved. The codes documented in this report are preliminary and upon coder review may  be revised to meet current compliance requirements. Hildred Laser, MD Hildred Laser, MD 06/19/2019 2:38:05 PM This report has been signed electronically. Number of Addenda: 0

## 2019-06-19 NOTE — Transfer of Care (Signed)
Immediate Anesthesia Transfer of Care Note  Patient: Taylor Cannon  Procedure(s) Performed: ESOPHAGOGASTRODUODENOSCOPY (EGD) WITH PROPOFOL (N/A ) ESOPHAGEAL DILATION (N/A )  Patient Location: PACU  Anesthesia Type:MAC  Level of Consciousness: awake, alert  and oriented  Airway & Oxygen Therapy: Patient Spontanous Breathing and Patient connected to nasal cannula oxygen  Post-op Assessment: Report given to RN, Post -op Vital signs reviewed and stable and Patient moving all extremities X 4  Post vital signs: Reviewed and stable  Last Vitals:  Vitals Value Taken Time  BP    Temp    Pulse 102 06/19/19 1433  Resp    SpO2 93 % 06/19/19 1433  Vitals shown include unvalidated device data.  Last Pain:  Vitals:   06/19/19 1315  TempSrc: Oral  PainSc: 0-No pain      Patients Stated Pain Goal: 6 (38/88/75 7972)  Complications: No apparent anesthesia complications

## 2019-06-19 NOTE — Anesthesia Postprocedure Evaluation (Signed)
Anesthesia Post Note  Patient: Taylor Cannon  Procedure(s) Performed: ESOPHAGOGASTRODUODENOSCOPY (EGD) WITH PROPOFOL (N/A ) ESOPHAGEAL DILATION (N/A )  Patient location during evaluation: PACU Anesthesia Type: MAC Level of consciousness: awake and alert and oriented Pain management: satisfactory to patient Vital Signs Assessment: post-procedure vital signs reviewed and stable Respiratory status: spontaneous breathing, nonlabored ventilation and respiratory function stable Cardiovascular status: stable Postop Assessment: no apparent nausea or vomiting Anesthetic complications: no     Last Vitals:  Vitals:   06/19/19 1315  BP: 126/78  Pulse: 86  Resp: 18  Temp: 36.4 C  SpO2: 99%    Last Pain:  Vitals:   06/19/19 1315  TempSrc: Oral  PainSc: 0-No pain                 Rakeya Glab

## 2019-06-22 ENCOUNTER — Telehealth: Payer: Self-pay | Admitting: *Deleted

## 2019-06-22 NOTE — Telephone Encounter (Signed)
janelle pts daughter on dpr called about pt saying that she needs refills on her xanax and phenergren. She said those help and she is going to be out of the medication before the week is out.

## 2019-06-22 NOTE — Addendum Note (Signed)
Addendum  created 06/22/19 0804 by Mickel Baas, CRNA   Charge Capture section accepted

## 2019-06-23 ENCOUNTER — Other Ambulatory Visit: Payer: Self-pay | Admitting: Family Medicine

## 2019-06-23 ENCOUNTER — Telehealth: Payer: Self-pay | Admitting: *Deleted

## 2019-06-23 DIAGNOSIS — R110 Nausea: Secondary | ICD-10-CM

## 2019-06-23 DIAGNOSIS — F419 Anxiety disorder, unspecified: Secondary | ICD-10-CM

## 2019-06-23 DIAGNOSIS — G4701 Insomnia due to medical condition: Secondary | ICD-10-CM

## 2019-06-23 MED ORDER — PROMETHAZINE HCL 12.5 MG PO TABS: 12.5000 mg | ORAL_TABLET | Freq: Three times a day (TID) | ORAL | 0 refills | Status: DC | PRN

## 2019-06-23 MED ORDER — LORAZEPAM 0.5 MG PO TABS: 0.5000 mg | ORAL_TABLET | Freq: Two times a day (BID) | ORAL | 0 refills | Status: DC

## 2019-06-23 MED ORDER — LORAZEPAM 0.5 MG PO TABS: 0.2500 mg | ORAL_TABLET | Freq: Two times a day (BID) | ORAL | 0 refills | Status: DC

## 2019-06-23 MED ORDER — ALPRAZOLAM 0.25 MG PO TABS: 0.1250 mg | ORAL_TABLET | Freq: Three times a day (TID) | ORAL | 0 refills | Status: DC | PRN

## 2019-06-23 NOTE — Telephone Encounter (Signed)
Have spoken with [facility] and let them know xanax has been changed to Ativan. Daughter aware

## 2019-06-23 NOTE — Telephone Encounter (Signed)
Spoke with daughter and let her know that the xanax has been changed to Ativan with verbal understanding.

## 2019-06-23 NOTE — Telephone Encounter (Signed)
Pt daughter [name] called said that [facility] called and said prescriptions were ready but they were waiting on an approval from the Dr and that would send a fax over

## 2019-06-25 ENCOUNTER — Encounter (HOSPITAL_COMMUNITY): Payer: Self-pay | Admitting: Internal Medicine

## 2019-07-02 ENCOUNTER — Telehealth: Payer: Self-pay | Admitting: *Deleted

## 2019-07-02 NOTE — Telephone Encounter (Signed)
Pt daughter [name] called. Said pt was unable to get phenergren wanted this to be called into [facility]  and wanted to know if she could take dramamen on a daily basis as she is having vertigo spells. Also wanted to let Jarrett Soho know that the ativan is not working her mom is still not sleeping.

## 2019-07-03 NOTE — Telephone Encounter (Signed)
Appointment scheduled for 07/07/2019

## 2019-07-07 ENCOUNTER — Encounter: Payer: Self-pay | Admitting: Family Medicine

## 2019-07-07 ENCOUNTER — Ambulatory Visit (INDEPENDENT_AMBULATORY_CARE_PROVIDER_SITE_OTHER): Payer: Medicare HMO | Admitting: Family Medicine

## 2019-07-07 ENCOUNTER — Other Ambulatory Visit: Payer: Self-pay

## 2019-07-07 DIAGNOSIS — H8110 Benign paroxysmal vertigo, unspecified ear: Secondary | ICD-10-CM

## 2019-07-07 DIAGNOSIS — R110 Nausea: Secondary | ICD-10-CM | POA: Diagnosis not present

## 2019-07-07 MED ORDER — PROMETHAZINE HCL 12.5 MG PO TABS: 12.5000 mg | ORAL_TABLET | Freq: Three times a day (TID) | ORAL | 0 refills | Status: DC | PRN

## 2019-07-07 NOTE — Progress Notes (Signed)
Virtual Visit via Telephone Note   This visit type was conducted due to national recommendations for restrictions regarding the COVID-19 Pandemic (e.g. social distancing) in an effort to limit this patient's exposure and mitigate transmission in our community.  Due to her co-morbid illnesses, this patient is at least at moderate risk for complications without adequate follow up.  This format is felt to be most appropriate for this patient at this time.  The patient did not have access to video technology/had technical difficulties with video requiring transitioning to audio format only (telephone).  All issues noted in this document were discussed and addressed.  No physical exam could be performed with this format.    Evaluation Performed:  Follow-up visit  Date:  07/07/2019   ID:  CARLEENA NEUPERT, DOB 1934/03/05, MRN [id number]  Patient Location: Home Provider Location: Office  Location of Patient: Home Location of Provider: Telehealth Consent was obtain for visit to be over via telehealth. I verified that I am speaking with the correct person using two identifiers.  PCP:  Perlie Mayo, NP   Chief Complaint:  dizziness  History of Present Illness:    MIASIA HEADMAN is a 85 y.o. female with history of COPD, on home O2 2 L continuously, depression, GERD, severe malnutrition, ex-smoker as of month and a half ago.  Presents today for follow-up on ongoing dizziness and vertigo-like symptoms that cause her to have nausea and vomiting.  Reports that Phenergan is one of the only things that helps to take care of this.  Interested the cover for and against that they are having to pay out-of-pocket for this.  But that is okay because it is the only thing that helps her feel better.  She takes it lays down in a couple hours she is feeling much better.  Episodes last about 2 hours.  Does have hard of hearing bilaterally has hearing aids that she does not wear continuously.  Could possibly be  prior to the calls.  Has been having this issue ongoing for a while.  Reports that she can go a week without having episodes and then the next week have 2 or 3 episodes and then a week without where does have one episode it varies and there is no rhyme or reason as to when it occurs or what happens.  Is not been associated with anything she eats or blood pressure.  The patient does not have symptoms concerning for COVID-19 infection (fever, chills, cough, or new shortness of breath).   Past Medical, Surgical, Social History, Allergies, and Medications have been Reviewed.  Past Medical History:  Diagnosis Date   Anxiety    COPD (chronic obstructive pulmonary disease) ([place])    Depression    GERD (gastroesophageal reflux disease)    Malnutrition ([place]) 05/26/2019   On home oxygen therapy    continuous at this time   Oxygen deficiency    TIA (transient ischemic attack)    Past Surgical History:  Procedure Laterality Date   APPENDECTOMY     BALLOON DILATION N/A 07/24/2013   Procedure: BALLOON DILATION to 13.30mm;  Surgeon: Rogene Houston, MD;  Location: [facility];  Service: Endoscopy;  Laterality: N/A;   BALLOON DILATION N/A 11/11/2014   Procedure: BALLOON DILATION;  Surgeon: Rogene Houston, MD;  [facility];  Service: Endoscopy;  Laterality: N/A;   CARDIAC ELECTROPHYSIOLOGY STUDY AND ABLATION     CATARACT EXTRACTION Bilateral    CERVICAL CONE  BIOPSY     ESOPHAGEAL DILATION N/A 11/30/2014   Procedure: ESOPHAGEAL DILATION;  Surgeon: Rogene Houston, MD;  Location: [facility];  Service: Endoscopy;  Laterality: N/A;  Balloon, 15, 16.5   ESOPHAGEAL DILATION N/A 06/03/2015   Procedure: ESOPHAGEAL DILATION WITH 16.5 MM BALLOON ;  Surgeon: Rogene Houston, MD;  Location: [facility];  Service: Endoscopy;  Laterality: N/A;   ESOPHAGEAL DILATION N/A 09/09/2015   Procedure: ESOPHAGEAL DILATION;  Surgeon: Rogene Houston, MD;  [facility];  Service: Endoscopy;  Laterality:  N/A;   ESOPHAGEAL DILATION N/A 05/04/2016   Procedure: ESOPHAGEAL DILATION;  Surgeon: Rogene Houston, MD;  [facility];  Service: Endoscopy;  Laterality: N/A;   ESOPHAGEAL DILATION N/A 06/22/2016   Procedure: ESOPHAGEAL DILATION;  Surgeon: Rogene Houston, MD;  [facility];  Service: Endoscopy;  Laterality: N/A;   ESOPHAGEAL DILATION N/A 05/21/2019   Procedure: ESOPHAGEAL DILATION;  Surgeon: Rogene Houston, MD;  [facility];  Service: Endoscopy;  Laterality: N/A;   ESOPHAGEAL DILATION N/A 06/19/2019   Procedure: ESOPHAGEAL DILATION;  Surgeon: Rogene Houston, MD;  [facility];  Service: Endoscopy;  Laterality: N/A;   ESOPHAGOGASTRODUODENOSCOPY N/A 11/11/2014   Procedure: ESOPHAGOGASTRODUODENOSCOPY (EGD);  Surgeon: Rogene Houston, MD;  [facility];  Service: Endoscopy;  Laterality: N/A;  125-rescheduled 11/11/14 @ 10:30am Ann notified pt   ESOPHAGOGASTRODUODENOSCOPY (EGD) WITH ESOPHAGEAL DILATION N/A 08/20/2013   Procedure: ESOPHAGOGASTRODUODENOSCOPY (EGD) WITH ESOPHAGEAL DILATION;  Surgeon: Rogene Houston, MD;  [facility];  Service: Endoscopy;  Laterality: N/A;  345-moved to 730 Ann notified pt   ESOPHAGOGASTRODUODENOSCOPY (EGD) WITH PROPOFOL N/A 07/24/2013   Procedure: ESOPHAGOGASTRODUODENOSCOPY (EGD) WITH PROPOFOL UNDER FLUOROSCOPY;  Surgeon: Rogene Houston, MD;  [facility];  Service: Endoscopy;  Laterality: N/A;   ESOPHAGOGASTRODUODENOSCOPY (EGD) WITH PROPOFOL N/A 11/30/2014   Procedure: ESOPHAGOGASTRODUODENOSCOPY (EGD) WITH PROPOFOL;  Surgeon: Rogene Houston, MD;  [facility]: [facility];  Service: Endoscopy;  Laterality: N/A;  fluoro not needed   ESOPHAGOGASTRODUODENOSCOPY (EGD) WITH PROPOFOL N/A 06/03/2015   Procedure: ESOPHAGOGASTRODUODENOSCOPY (EGD) WITH PROPOFOL; HIATUS AT 35 CM AND GE JUNCTION 40 CM;  Surgeon: Rogene Houston, MD;  [facility];  Service: Endoscopy;  Laterality: N/A;    ESOPHAGOGASTRODUODENOSCOPY (EGD) WITH PROPOFOL N/A 09/09/2015   Procedure: ESOPHAGOGASTRODUODENOSCOPY (EGD) WITH PROPOFOL;  Surgeon: Rogene Houston, MD;  [facility];  Service: Endoscopy;  Laterality: N/A;  8:15   ESOPHAGOGASTRODUODENOSCOPY (EGD) WITH PROPOFOL N/A 05/04/2016   Procedure: ESOPHAGOGASTRODUODENOSCOPY (EGD) WITH PROPOFOL;  Surgeon: Rogene Houston, MD;  [facility];  Service: Endoscopy;  Laterality: N/A;  9:30   ESOPHAGOGASTRODUODENOSCOPY (EGD) WITH PROPOFOL N/A 06/22/2016   Procedure: ESOPHAGOGASTRODUODENOSCOPY (EGD) WITH PROPOFOL;  Surgeon: Rogene Houston, MD;  [facility];  Service: Endoscopy;  Laterality: N/A;  11:20   ESOPHAGOGASTRODUODENOSCOPY (EGD) WITH PROPOFOL N/A 05/21/2019   Procedure: ESOPHAGOGASTRODUODENOSCOPY (EGD) WITH PROPOFOL;  Surgeon: Rogene Houston, MD;  [facility];  Service: Endoscopy;  Laterality: N/A;  2:30pm   ESOPHAGOGASTRODUODENOSCOPY (EGD) WITH PROPOFOL N/A 06/19/2019   Procedure: ESOPHAGOGASTRODUODENOSCOPY (EGD) WITH PROPOFOL;  Surgeon: Rogene Houston, MD;  [facility];  Service: Endoscopy;  Laterality: N/A;  220pm   INTRAMEDULLARY (IM) NAIL INTERTROCHANTERIC Left 09/18/2015   Procedure: INTRAMEDULLARY (IM) NAIL INTERTROCHANTRIC;  Surgeon: Carole Civil, MD;  Location: [facility];  Service: Orthopedics;  Laterality: Left;   TUBAL LIGATION  Current Meds  Medication Sig   albuterol (VENTOLIN HFA) 108 (90 Base) MCG/ACT inhaler Inhale 2 puffs into the lungs every 6 (six) hours as needed for wheezing or shortness of breath.   allopurinol (ZYLOPRIM) 100 MG tablet Take 100 mg by mouth daily.    BREO ELLIPTA 200-25 MCG/INH AEPB Inhale 1 puff into the lungs 2 (two) times daily.    Docusate Calcium (STOOL SOFTENER PO) Take 1 tablet by mouth daily.   LORazepam (ATIVAN) 0.5 MG tablet Take 0.5 tablets (0.25 mg total) by mouth 2 (two) times daily.   metoprolol tartrate (LOPRESSOR) 25 MG  tablet Take 1 tablet (25 mg total) by mouth 2 (two) times daily.   mirtazapine (REMERON SOL-TAB) 30 MG disintegrating tablet    mirtazapine (REMERON) 30 MG tablet Take 30 mg by mouth at bedtime.    Multiple Vitamin (MULTIVITAMIN PO) Take 1 tablet by mouth daily.   nystatin (MYCOSTATIN) 100000 UNIT/ML suspension Take 5 mLs (500,000 Units total) by mouth 3 (three) times daily as needed.   OXYGEN Inhale 2 L into the lungs continuous.    pantoprazole (PROTONIX) 40 MG tablet Take 1 tablet (40 mg total) by mouth 2 (two) times daily before a meal.   promethazine (PHENERGAN) 12.5 MG tablet Take 1 tablet (12.5 mg total) by mouth every 8 (eight) hours as needed for nausea or vomiting.   vitamin C (ASCORBIC ACID) 250 MG tablet Take 500 mg by mouth daily.    [DISCONTINUED] promethazine (PHENERGAN) 12.5 MG tablet Take 1 tablet (12.5 mg total) by mouth every 8 (eight) hours as needed for nausea or vomiting.     Allergies:   Other and Oxycodone   Social History   Tobacco Use   Smoking status: Former Smoker    Packs/day: 1.00    Years: 55.00    Pack years: 55.00    Types: Cigarettes    Quit date: 05/12/2019    Years since quitting: 0.1   Smokeless tobacco: Never Used   Tobacco comment: quit smoking since 1st of July.  Smoked all her life.   Substance Use Topics   Alcohol use: Yes    Comment: 2 shots at night   Drug use: No     Family Hx: The patient's family history is not on file.  ROS:   Please see the history of present illness.    All other systems reviewed and are negative.   Labs/Other Tests and Data Reviewed:    Recent Labs: 05/12/2019: B Natriuretic Peptide 69.0; TSH 2.482 05/13/2019: ALT 21 05/15/2019: BUN 12; Creatinine, Ser 0.56; Hemoglobin 11.6; Magnesium 1.8; Platelets 293; Potassium 4.3; Sodium 134   Recent Lipid Panel No results found for: CHOL, TRIG, HDL, CHOLHDL, LDLCALC, LDLDIRECT  Wt Readings from Last 3 Encounters:  06/19/19 90 lb 15.7 oz (41.3 kg)    06/18/19 91 lb (41.3 kg)  05/26/19 90 lb 1.9 oz (40.9 kg)     Objective:    Vital Signs:  There were no vitals taken for this visit.   GEN:  alert and oriented  RESPIRATORY:  no shortness of breath in conversation  PSYCH:  normal mood and affect   ASSESSMENT & PLAN:    1. Nausea Positive association of vertigo.  Looks to have had benign proximal positional vertigo at some point in time and her diagnosis history.  Has tried physical therapy moves for this daughter reports that they just were not very helpful.  And she continued to still have the vertigo.  Reports that  she does not wear hearing aids as much as she should.  This possibly could be causing some of her vertigo issues which creates nausea.  Will prescribe her Phenergan to purchase without insurance as she reports this is the only thing that works for her.  Zofran does not work for her.  She has somebody with her at all times.  Daughter and Ms. Shakema are both aware that this medication can cause her to have increased dizziness but it is the only thing that works to take away her nausea when this happens and vomiting.  Reviewed side effects, risks and benefits of medication.   Patient acknowledged agreement and understanding of the plan.    - promethazine (PHENERGAN) 12.5 MG tablet; Take 1 tablet (12.5 mg total) by mouth every 8 (eight) hours as needed for nausea or vomiting.  Dispense: 15 tablet; Refill: 0  2. Benign paroxysmal positional vertigo, unspecified laterality Signs and symptoms are associated with vertigo unsure of which classification at this time.  Reports that she has had some physical therapy related to this.  But it does not help.  Will prescribe Phenergan and continue on the current course of is not associated with blood pressure drop.  Additionally he is doing well at home overall eating sleeping and not have any other issues or concerns.  There is no rhyme or reason as to when the vertigo hits there is no  telling if is going to happen 1 day to the next.  Sometimes she will go for a week without having it.  At this time CT scan is declined by daughter they will try to get her to wear her hearing aids more frequently to see if that will help.    Time:   Today, I have spent 10 minutes with the patient with telehealth technology discussing the above problems.     Medication Adjustments/Labs and Tests Ordered: Current medicines are reviewed at length with the patient today.  Concerns regarding medicines are outlined above.   Tests Ordered: No orders of the defined types were placed in this encounter.   Medication Changes: Meds ordered this encounter  Medications   promethazine (PHENERGAN) 12.5 MG tablet    Sig: Take 1 tablet (12.5 mg total) by mouth every 8 (eight) hours as needed for nausea or vomiting.    Dispense:  15 tablet    Refill:  0    Order Specific Question:   Supervising Provider    Answer:   Fayrene Helper [id number]    Disposition:  Follow up .3 months or PRN  Perlie Mayo, NP  07/07/2019 3:04 PM     [facility]

## 2019-07-07 NOTE — Patient Instructions (Signed)
   I appreciate the opportunity to provide you with the care for your health and wellness. Today we discussed: dizziness  Follow up: 3 months   No labs or referrals today  Continue phenergan, please be mindful that this medication can make someone more off balanced.  Please continue to practice social distancing to keep you, your family, and our community safe.  If you must go out, please wear a mask and practice good handwashing.  It was a pleasure to see you and I look forward to continuing to work together on your health and well-being. Please do not hesitate to call the office if you need care or have questions about your care.  Have a wonderful day and week. With Gratitude, Cherly Beach, DNP, AGNP-BC

## 2019-07-08 ENCOUNTER — Telehealth: Payer: Self-pay

## 2019-07-08 DIAGNOSIS — J439 Emphysema, unspecified: Secondary | ICD-10-CM

## 2019-07-08 DIAGNOSIS — R7981 Abnormal blood-gas level: Secondary | ICD-10-CM

## 2019-07-08 DIAGNOSIS — Z9981 Dependence on supplemental oxygen: Secondary | ICD-10-CM

## 2019-07-08 NOTE — Telephone Encounter (Signed)
FMLA   Copied Noted sleeved 

## 2019-07-09 ENCOUNTER — Telehealth: Payer: Self-pay

## 2019-07-09 ENCOUNTER — Other Ambulatory Visit: Payer: Self-pay

## 2019-07-09 ENCOUNTER — Other Ambulatory Visit: Payer: Self-pay | Admitting: Family Medicine

## 2019-07-09 DIAGNOSIS — G4701 Insomnia due to medical condition: Secondary | ICD-10-CM

## 2019-07-09 DIAGNOSIS — E43 Unspecified severe protein-calorie malnutrition: Secondary | ICD-10-CM

## 2019-07-09 MED ORDER — METOPROLOL TARTRATE 25 MG PO TABS: 25.0000 mg | ORAL_TABLET | Freq: Two times a day (BID) | ORAL | 1 refills | Status: DC

## 2019-07-09 MED ORDER — MIRTAZAPINE 30 MG PO TABS: 30.0000 mg | ORAL_TABLET | Freq: Every day | ORAL | 3 refills | Status: DC

## 2019-07-09 NOTE — Telephone Encounter (Signed)
There are no directions in there for the Mirtazapine. Please put the sig in if you want the patient to have it refilled.

## 2019-07-09 NOTE — Telephone Encounter (Signed)
Medication refilled and sent to pharmacy.

## 2019-07-09 NOTE — Telephone Encounter (Signed)
Pts daughter is calling back saying she needs Mirtazapine, the pharmacy said you must have sent it when the power was out

## 2019-07-09 NOTE — Telephone Encounter (Signed)
pts daughter is calling in regarding Metoprolol --it was not approved

## 2019-07-10 ENCOUNTER — Institutional Professional Consult (permissible substitution): Payer: Medicare HMO | Admitting: Internal Medicine

## 2019-07-21 ENCOUNTER — Institutional Professional Consult (permissible substitution): Payer: Medicare HMO | Admitting: Pulmonary Disease

## 2019-07-24 ENCOUNTER — Telehealth: Payer: Self-pay | Admitting: *Deleted

## 2019-07-24 NOTE — Telephone Encounter (Signed)
Janelle pts daughter called said to [facility] [address] [facility] [place]

## 2019-07-24 NOTE — Telephone Encounter (Signed)
Pharmacy changed

## 2019-08-03 ENCOUNTER — Other Ambulatory Visit: Payer: Self-pay

## 2019-08-03 MED ORDER — METOPROLOL TARTRATE 25 MG PO TABS: 25.0000 mg | ORAL_TABLET | Freq: Two times a day (BID) | ORAL | 1 refills | Status: DC

## 2019-08-06 ENCOUNTER — Other Ambulatory Visit: Payer: Self-pay

## 2019-08-06 MED ORDER — METOPROLOL TARTRATE 25 MG PO TABS: 25.0000 mg | ORAL_TABLET | Freq: Two times a day (BID) | ORAL | 1 refills | Status: DC

## 2019-08-17 ENCOUNTER — Institutional Professional Consult (permissible substitution): Payer: Medicare HMO | Admitting: Pulmonary Disease

## 2019-08-17 ENCOUNTER — Other Ambulatory Visit: Payer: Self-pay

## 2019-08-17 DIAGNOSIS — G4701 Insomnia due to medical condition: Secondary | ICD-10-CM

## 2019-08-17 DIAGNOSIS — E43 Unspecified severe protein-calorie malnutrition: Secondary | ICD-10-CM

## 2019-08-17 MED ORDER — MIRTAZAPINE 30 MG PO TABS: 30.0000 mg | ORAL_TABLET | Freq: Every day | ORAL | 3 refills | Status: DC

## 2019-08-19 ENCOUNTER — Other Ambulatory Visit: Payer: Self-pay

## 2019-08-19 DIAGNOSIS — G4701 Insomnia due to medical condition: Secondary | ICD-10-CM

## 2019-08-19 DIAGNOSIS — E43 Unspecified severe protein-calorie malnutrition: Secondary | ICD-10-CM

## 2019-08-19 MED ORDER — MIRTAZAPINE 30 MG PO TABS: 30.0000 mg | ORAL_TABLET | Freq: Every day | ORAL | 0 refills | Status: DC

## 2019-08-25 ENCOUNTER — Ambulatory Visit (INDEPENDENT_AMBULATORY_CARE_PROVIDER_SITE_OTHER): Payer: Medicare HMO | Admitting: Family Medicine

## 2019-08-25 ENCOUNTER — Other Ambulatory Visit: Payer: Self-pay

## 2019-08-25 ENCOUNTER — Encounter: Payer: Self-pay | Admitting: Family Medicine

## 2019-08-25 DIAGNOSIS — F419 Anxiety disorder, unspecified: Secondary | ICD-10-CM | POA: Diagnosis not present

## 2019-08-25 DIAGNOSIS — Z9981 Dependence on supplemental oxygen: Secondary | ICD-10-CM | POA: Diagnosis not present

## 2019-08-25 DIAGNOSIS — J439 Emphysema, unspecified: Secondary | ICD-10-CM | POA: Diagnosis not present

## 2019-08-25 DIAGNOSIS — G4701 Insomnia due to medical condition: Secondary | ICD-10-CM

## 2019-08-25 MED ORDER — LORAZEPAM 0.5 MG PO TABS: 0.2500 mg | ORAL_TABLET | Freq: Two times a day (BID) | ORAL | 0 refills | Status: DC

## 2019-08-25 NOTE — Assessment & Plan Note (Signed)
Needs refill on Ativan. It is the only medication that helps her relax and rest.  Provided.  Reviewed side effects, risks and benefits of medication.   Patient acknowledged agreement and understanding of the plan.

## 2019-08-25 NOTE — Assessment & Plan Note (Signed)
Refill of Ativan provided. I would prefer not to use this one, but we tried others without success. Continue for now with her and daughter aware of side effects.

## 2019-08-25 NOTE — Assessment & Plan Note (Signed)
Controlled, not much improved breathing wise per her. Overall she still not smoking, reports this is hard at times, but she doesn't go back. No refills needed on inhalers, not using rescue often.  Encouraged to continue smoking cessation.

## 2019-08-25 NOTE — Patient Instructions (Signed)
  I appreciate the opportunity to provide you with care for your health and wellness. Today we discussed: insomnia and COPD  Follow up: 3 months in office   No labs or referrals today  Continue all medications as ordered.  CONTINUE THE GREAT JOB OF NOT SMOKING!  You are in my prayers to continue this.   I hope you have a wonderful, happy, safe, and healthy Holiday Season! See you in the New Year :)  Please continue to practice social distancing to keep you, your family, and our community safe.  If you must go out, please wear a mask and practice good handwashing.  It was a pleasure to see you and I look forward to continuing to work together on your health and well-being. Please do not hesitate to call the office if you need care or have questions about your care.  Have a wonderful day and week. With Gratitude, Cherly Beach, DNP, AGNP-BC

## 2019-08-25 NOTE — Progress Notes (Signed)
Virtual Visit via Telephone Note   This visit type was conducted due to national recommendations for restrictions regarding the COVID-19 Pandemic (e.g. social distancing) in an effort to limit this patient's exposure and mitigate transmission in our community.  Due to her co-morbid illnesses, this patient is at least at moderate risk for complications without adequate follow up.  This format is felt to be most appropriate for this patient at this time.  The patient did not have access to video technology/had technical difficulties with video requiring transitioning to audio format only (telephone).  All issues noted in this document were discussed and addressed.  No physical exam could be performed with this format.    Evaluation Performed:  Follow-up visit  Date:  08/25/2019   ID:  Taylor, Cannon 02-Aug-1934, MRN [id number]  Patient Location: Home Provider Location: Office  Location of Patient: Home Location of Provider: Telehealth Consent was obtain for visit to be over via telehealth. I verified that I am speaking with the correct person using two identifiers.  PCP:  Perlie Mayo, NP   Chief Complaint:  Follow up on insomnia and COPD  History of Present Illness:    Taylor Cannon is a 85 y.o. female with history of COPD, on home O2 2 L continuously, depression, GERD, severe malnutrition, ex-smoker as of 05/12/2019.  Presents today for follow-up regarding insomnia and COPD.  Reports that she needs a refill on her Ativan this is the only medication that we have found that is helpful for her to help relax reduce her anxiety and help her go to sleep.  Her and her daughter are aware of the side effects of this I have greatly gone into detail regarding the use of it but they still would like to use it for her to help sleep and relax.  She has not smoked since September.  Today she reports that it is hard sometimes she just wants to "eat a cigarette "but overall she is not smoked  since she was in the hospital back in September.  Reports that some days are better than others.  She asked for prior to that she can continue to not smoke.  Denies having any breathing troubles.  Denies having any excessive use of her rescue inhalers.  Reports that she has been staying in to try to avoid Covid and anything else is going around right now.  Denies having any shortness of breath, chest pain, dizziness, headaches or breathing trouble.  The patient does not have symptoms concerning for COVID-19 infection (fever, chills, cough, or new shortness of breath).   Past Medical, Surgical, Social History, Allergies, and Medications have been Reviewed.  Past Medical History:  Diagnosis Date  . Anxiety   . COPD (chronic obstructive pulmonary disease) ([place])   . Depression   . GERD (gastroesophageal reflux disease)   . Malnutrition ([place]) 05/26/2019  . On home oxygen therapy    continuous at this time  . Oxygen deficiency   . TIA (transient ischemic attack)      Current Meds  Medication Sig  . albuterol (VENTOLIN HFA) 108 (90 Base) MCG/ACT inhaler Inhale 2 puffs into the lungs every 6 (six) hours as needed for wheezing or shortness of breath.  . allopurinol (ZYLOPRIM) 100 MG tablet Take 100 mg by mouth daily.   Marland Kitchen BREO ELLIPTA 200-25 MCG/INH AEPB Inhale 1 puff into the lungs 2 (two) times daily.   Mariane Baumgarten Calcium (STOOL SOFTENER PO) Take 1  tablet by mouth daily.  Marland Kitchen LORazepam (ATIVAN) 0.5 MG tablet Take 0.5 tablets (0.25 mg total) by mouth 2 (two) times daily.  . metoprolol tartrate (LOPRESSOR) 25 MG tablet Take 1 tablet (25 mg total) by mouth 2 (two) times daily.  . mirtazapine (REMERON) 30 MG tablet Take 1 tablet (30 mg total) by mouth at bedtime.  . mometasone-formoterol (DULERA) 200-5 MCG/ACT AERO Inhale 1 puff into the lungs 2 (two) times daily. (Patient taking differently: Inhale 1 puff into the lungs 2 (two) times daily as needed for wheezing or shortness of breath. )  . Multiple  Vitamin (MULTIVITAMIN PO) Take 1 tablet by mouth daily.  Marland Kitchen nystatin (MYCOSTATIN) 100000 UNIT/ML suspension Take 5 mLs (500,000 Units total) by mouth 3 (three) times daily as needed.  . OXYGEN Inhale 2 L into the lungs continuous.   . pantoprazole (PROTONIX) 40 MG tablet Take 1 tablet (40 mg total) by mouth 2 (two) times daily before a meal.  . promethazine (PHENERGAN) 12.5 MG tablet Take 1 tablet (12.5 mg total) by mouth every 8 (eight) hours as needed for nausea or vomiting.  . vitamin C (ASCORBIC ACID) 250 MG tablet Take 500 mg by mouth daily.   . [DISCONTINUED] LORazepam (ATIVAN) 0.5 MG tablet Take 0.5 tablets (0.25 mg total) by mouth 2 (two) times daily.     Allergies:   Other and Oxycodone   ROS:   Please see the history of present illness.    All other systems reviewed and are negative.   Labs/Other Tests and Data Reviewed:   Recent Lipid Panel No results found for: CHOL, TRIG, HDL, CHOLHDL, LDLCALC, LDLDIRECT  Wt Readings from Last 3 Encounters:  06/19/19 90 lb 15.7 oz (41.3 kg)  06/18/19 91 lb (41.3 kg)  05/26/19 90 lb 1.9 oz (40.9 kg)     Objective:    Vital Signs:  There were no vitals taken for this visit.   GEN:  alert and oriented RESPIRATORY:  no shortness of breath in conversation  PSYCH:  normal affect and mood, good communication  ASSESSMENT & PLAN:   Please see problem list for details.  1. Pulmonary emphysema, unspecified emphysema type ([place])  2. Insomnia due to medical condition  - LORazepam (ATIVAN) 0.5 MG tablet; Take 0.5 tablets (0.25 mg total) by mouth 2 (two) times daily.  Dispense: 30 tablet; Refill: 0  3. Anxiety  - LORazepam (ATIVAN) 0.5 MG tablet; Take 0.5 tablets (0.25 mg total) by mouth 2 (two) times daily.  Dispense: 30 tablet; Refill: 0   Time:   Today, I have spent 10 minutes with the patient with telehealth technology discussing the above problems.     Medication Adjustments/Labs and Tests Ordered: Current medicines are reviewed  at length with the patient today.  Concerns regarding medicines are outlined above.   Tests Ordered: No orders of the defined types were placed in this encounter.   Medication Changes: Meds ordered this encounter  Medications  . LORazepam (ATIVAN) 0.5 MG tablet    Sig: Take 0.5 tablets (0.25 mg total) by mouth 2 (two) times daily.    Dispense:  30 tablet    Refill:  0    Half tablet dose, please disregard previous script.    Order Specific Question:   Supervising Provider    Answer:   Fayrene Helper [id number]    Disposition:  Follow up 3 months   Signed, Perlie Mayo, NP  08/25/2019 9:29 AM     [facility]

## 2019-08-31 ENCOUNTER — Telehealth: Payer: Self-pay

## 2019-08-31 NOTE — Telephone Encounter (Signed)
Daughter LVM that she needed a call back [phone], called the number back and not a working number

## 2019-09-24 ENCOUNTER — Other Ambulatory Visit: Payer: Self-pay

## 2019-09-24 DIAGNOSIS — F419 Anxiety disorder, unspecified: Secondary | ICD-10-CM

## 2019-09-24 DIAGNOSIS — G4701 Insomnia due to medical condition: Secondary | ICD-10-CM

## 2019-09-24 MED ORDER — LORAZEPAM 0.5 MG PO TABS: 0.2500 mg | ORAL_TABLET | Freq: Two times a day (BID) | ORAL | 0 refills | Status: DC

## 2019-09-25 ENCOUNTER — Telehealth: Payer: Self-pay | Admitting: *Deleted

## 2019-09-25 NOTE — Telephone Encounter (Signed)
janelle pt daughter called pt needs her [facility]azepam called [facility] [address] in [place]

## 2019-09-25 NOTE — Telephone Encounter (Signed)
Left generic message letting daughter know medication was called in yesterday

## 2019-10-19 ENCOUNTER — Other Ambulatory Visit: Payer: Self-pay

## 2019-10-19 MED ORDER — METOPROLOL TARTRATE 25 MG PO TABS: 25.0000 mg | ORAL_TABLET | Freq: Two times a day (BID) | ORAL | 1 refills | Status: DC

## 2019-10-28 ENCOUNTER — Other Ambulatory Visit: Payer: Self-pay

## 2019-10-28 DIAGNOSIS — G4701 Insomnia due to medical condition: Secondary | ICD-10-CM

## 2019-10-28 DIAGNOSIS — F419 Anxiety disorder, unspecified: Secondary | ICD-10-CM

## 2019-10-28 MED ORDER — LORAZEPAM 0.5 MG PO TABS: 0.2500 mg | ORAL_TABLET | Freq: Two times a day (BID) | ORAL | 0 refills | Status: DC

## 2019-11-16 ENCOUNTER — Other Ambulatory Visit: Payer: Self-pay | Admitting: *Deleted

## 2019-11-16 MED ORDER — METOPROLOL TARTRATE 25 MG PO TABS: 25.0000 mg | ORAL_TABLET | Freq: Two times a day (BID) | ORAL | 1 refills | Status: DC

## 2019-11-18 ENCOUNTER — Other Ambulatory Visit (INDEPENDENT_AMBULATORY_CARE_PROVIDER_SITE_OTHER): Payer: Self-pay | Admitting: Internal Medicine

## 2019-11-18 NOTE — Telephone Encounter (Signed)
Patient will need to have OV prior to next refill. This refill good for 2 months.

## 2019-11-24 ENCOUNTER — Ambulatory Visit: Payer: Medicare HMO | Admitting: Family Medicine

## 2019-11-27 ENCOUNTER — Telehealth: Payer: Self-pay

## 2019-11-27 ENCOUNTER — Other Ambulatory Visit (INDEPENDENT_AMBULATORY_CARE_PROVIDER_SITE_OTHER): Payer: Self-pay | Admitting: Internal Medicine

## 2019-11-27 ENCOUNTER — Other Ambulatory Visit: Payer: Self-pay | Admitting: *Deleted

## 2019-11-27 DIAGNOSIS — F419 Anxiety disorder, unspecified: Secondary | ICD-10-CM

## 2019-11-27 DIAGNOSIS — G4701 Insomnia due to medical condition: Secondary | ICD-10-CM

## 2019-11-27 MED ORDER — LORAZEPAM 0.5 MG PO TABS: 0.2500 mg | ORAL_TABLET | Freq: Two times a day (BID) | ORAL | 0 refills | Status: DC

## 2019-11-27 NOTE — Telephone Encounter (Signed)
LORazepam (ATIVAN) 0.5 MG tablet please send to pharmacy

## 2019-11-30 ENCOUNTER — Other Ambulatory Visit: Payer: Self-pay | Admitting: *Deleted

## 2019-11-30 DIAGNOSIS — E43 Unspecified severe protein-calorie malnutrition: Secondary | ICD-10-CM

## 2019-11-30 DIAGNOSIS — G4701 Insomnia due to medical condition: Secondary | ICD-10-CM

## 2019-11-30 MED ORDER — MIRTAZAPINE 30 MG PO TABS: 30.0000 mg | ORAL_TABLET | Freq: Every day | ORAL | 0 refills | Status: DC

## 2019-11-30 NOTE — Telephone Encounter (Signed)
Medication sent to pharmacy  

## 2019-12-02 ENCOUNTER — Ambulatory Visit (INDEPENDENT_AMBULATORY_CARE_PROVIDER_SITE_OTHER): Payer: Medicare HMO | Admitting: Family Medicine

## 2019-12-02 ENCOUNTER — Encounter: Payer: Self-pay | Admitting: Family Medicine

## 2019-12-02 ENCOUNTER — Other Ambulatory Visit: Payer: Self-pay

## 2019-12-02 VITALS — BP 133/70 | Ht 68.0 in | Wt 103.0 lb

## 2019-12-02 DIAGNOSIS — K219 Gastro-esophageal reflux disease without esophagitis: Secondary | ICD-10-CM | POA: Diagnosis not present

## 2019-12-02 DIAGNOSIS — R490 Dysphonia: Secondary | ICD-10-CM | POA: Insufficient documentation

## 2019-12-02 DIAGNOSIS — Z9981 Dependence on supplemental oxygen: Secondary | ICD-10-CM

## 2019-12-02 DIAGNOSIS — J439 Emphysema, unspecified: Secondary | ICD-10-CM | POA: Diagnosis not present

## 2019-12-02 DIAGNOSIS — M7989 Other specified soft tissue disorders: Secondary | ICD-10-CM | POA: Diagnosis not present

## 2019-12-02 NOTE — Progress Notes (Signed)
Virtual Visit via Telephone Note   This visit type was conducted due to national recommendations for restrictions regarding the COVID-19 Pandemic (e.g. social distancing) in an effort to limit this patient's exposure and mitigate transmission in our community.  Due to her co-morbid illnesses, this patient is at least at moderate risk for complications without adequate follow up.  This format is felt to be most appropriate for this patient at this time.  The patient did not have access to video technology/had technical difficulties with video requiring transitioning to audio format only (telephone).  All issues noted in this document were discussed and addressed.  No physical exam could be performed with this format.   Evaluation Performed:  Follow-up visit  Date:  12/02/2019   ID:  Taylor Cannon, DOB [birth year], MRN [id number]  Patient Location: Home Provider Location: Office  Location of Patient: Home Location of Provider: Telehealth Consent was obtain for visit to be over via telehealth. I verified that I am speaking with the correct person using two identifiers.  PCP:  Perlie Mayo, NP   Chief Complaint:   Hoarseness and leg swelling  History of Present Illness:    EMRI CADD is a 86 y.o. female with history of anxiety, COPD, depression, GERD, malnutrition, oxygen therapy among others.  Presents today for follow-up daughter is concerned that as she is noted over the last week or so 2 months she has been having some hoarseness that comes and goes but usually she is hoarse in the morning and it gets better as the day goes on unless she talks a lot.  Reports that she is taking her Protonix as directed and without any issue.  Has follow-up with GI in June but might need to go earlier.  Denies having any nausea vomiting or heartburn typical symptoms.   Additionally she reports she has bilateral foot and ankle swelling noticed a couple weeks ago.  Reports that they are great in  the morning as the day goes on or if she is up on her feet a lot they swell.  Does not wear compression socks or hose at this time.  Does not have a history of having heart failure at this time.  Denies have any chest pain, palpitations, headaches or dizziness or vision changes.  Reports taking all her medications as directed and without any issue has no other questions or concerns today.   bilateral feet and ankle swelling noticed a few weeks ago. They are ok in the am and they swell as the day goes on and she is up on her feet they swell   The patient does not have symptoms concerning for COVID-19 infection (fever, chills, cough, or new shortness of breath).   Past Medical, Surgical, Social History, Allergies, and Medications have been Reviewed.  Past Medical History:  Diagnosis Date  . Anxiety   . Benign paroxysmal positional vertigo 10/05/2014  . Closed fracture of intertrochanteric section of femur ([place])   . Closed left hip fracture ([place]) 09/16/2015  . COPD (chronic obstructive pulmonary disease) ([place])   . Depression   . Fall 09/16/2015  . Fall at home, initial encounter 02/28/2017  . GERD (gastroesophageal reflux disease)   . Intertrochanteric fracture of left femur ([place]) 09/18/2015  . Malnutrition ([place]) 05/26/2019  . On home oxygen therapy    continuous at this time  . Oxygen deficiency   . S/P ORIF (open reduction internal fixation) fracture left hip 09/18/15 IM nail  05/27/2018  .  TIA (transient ischemic attack)    Past Surgical History:  Procedure Laterality Date  . APPENDECTOMY    . BALLOON DILATION N/A 07/24/2013   Procedure: BALLOON DILATION to 13.88mm;  Surgeon: Rogene Houston, MD;  Location: [facility];  Service: Endoscopy;  Laterality: N/A;  . BALLOON DILATION N/A 11/11/2014   Procedure: BALLOON DILATION;  Surgeon: Rogene Houston, MD;  Location[facility];  Service: Endoscopy;  Laterality: N/A;  . CARDIAC ELECTROPHYSIOLOGY STUDY AND ABLATION    . CATARACT EXTRACTION  Bilateral   . CERVICAL CONE BIOPSY    . ESOPHAGEAL DILATION N/A 11/30/2014   Procedure: ESOPHAGEAL DILATION;  Surgeon: Rogene Houston, MD;  Location: [facility];  Service: Endoscopy;  Laterality: N/A;  Balloon, 15, 16.5  . ESOPHAGEAL DILATION N/A 06/03/2015   Procedure: ESOPHAGEAL DILATION WITH 16.5 MM BALLOON ;  Surgeon: Rogene Houston, MD;  Location: [facility];  Service: Endoscopy;  Laterality: N/A;  . ESOPHAGEAL DILATION N/A 09/09/2015   Procedure: ESOPHAGEAL DILATION;  Surgeon: Rogene Houston, MD;  [facility];  Service: Endoscopy;  Laterality: N/A;  . ESOPHAGEAL DILATION N/A 05/04/2016   Procedure: ESOPHAGEAL DILATION;  Surgeon: Rogene Houston, MD;  [facility];  Service: Endoscopy;  Laterality: N/A;  . ESOPHAGEAL DILATION N/A 06/22/2016   Procedure: ESOPHAGEAL DILATION;  Surgeon: Rogene Houston, MD;  [facility];  Service: Endoscopy;  Laterality: N/A;  . ESOPHAGEAL DILATION N/A 05/21/2019   Procedure: ESOPHAGEAL DILATION;  Surgeon: Rogene Houston, MD;  [facility];  Service: Endoscopy;  Laterality: N/A;  . ESOPHAGEAL DILATION N/A 06/19/2019   Procedure: ESOPHAGEAL DILATION;  Surgeon: Rogene Houston, MD;  [facility];  Service: Endoscopy;  Laterality: N/A;  . ESOPHAGOGASTRODUODENOSCOPY N/A 11/11/2014   Procedure: ESOPHAGOGASTRODUODENOSCOPY (EGD);  Surgeon: Rogene Houston, MD;  [facility];  Service: Endoscopy;  Laterality: N/A;  125-rescheduled 11/11/14 @ 10:30am Ann notified pt  . ESOPHAGOGASTRODUODENOSCOPY (EGD) WITH ESOPHAGEAL DILATION N/A 08/20/2013   Procedure: ESOPHAGOGASTRODUODENOSCOPY (EGD) WITH ESOPHAGEAL DILATION;  Surgeon: Rogene Houston, MD;  [facility];  Service: Endoscopy;  Laterality: N/A;  345-moved to 730 Ann notified pt  . ESOPHAGOGASTRODUODENOSCOPY (EGD) WITH PROPOFOL N/A 07/24/2013   Procedure: ESOPHAGOGASTRODUODENOSCOPY (EGD) WITH PROPOFOL UNDER FLUOROSCOPY;  Surgeon: Rogene Houston, MD;   [facility];  Service: Endoscopy;  Laterality: N/A;  . ESOPHAGOGASTRODUODENOSCOPY (EGD) WITH PROPOFOL N/A 11/30/2014   Procedure: ESOPHAGOGASTRODUODENOSCOPY (EGD) WITH PROPOFOL;  Surgeon: Rogene Houston, MD;  [facility]: [facility];  Service: Endoscopy;  Laterality: N/A;  fluoro not needed  . ESOPHAGOGASTRODUODENOSCOPY (EGD) WITH PROPOFOL N/A 06/03/2015   Procedure: ESOPHAGOGASTRODUODENOSCOPY (EGD) WITH PROPOFOL; HIATUS AT 35 CM AND GE JUNCTION 40 CM;  Surgeon: Rogene Houston, MD;  [facility]: [facility];  Service: Endoscopy;  Laterality: N/A;  . ESOPHAGOGASTRODUODENOSCOPY (EGD) WITH PROPOFOL N/A 09/09/2015   Procedure: ESOPHAGOGASTRODUODENOSCOPY (EGD) WITH PROPOFOL;  Surgeon: Rogene Houston, MD;  [facility];  Service: Endoscopy;  Laterality: N/A;  8:15  . ESOPHAGOGASTRODUODENOSCOPY (EGD) WITH PROPOFOL N/A 05/04/2016   Procedure: ESOPHAGOGASTRODUODENOSCOPY (EGD) WITH PROPOFOL;  Surgeon: Rogene Houston, MD;  [facility];  Service: Endoscopy;  Laterality: N/A;  9:30  . ESOPHAGOGASTRODUODENOSCOPY (EGD) WITH PROPOFOL N/A 06/22/2016   Procedure: ESOPHAGOGASTRODUODENOSCOPY (EGD) WITH PROPOFOL;  Surgeon: Rogene Houston, MD;  [facility];  Service: Endoscopy;  Laterality: N/A;  11:20  . ESOPHAGOGASTRODUODENOSCOPY (EGD) WITH PROPOFOL N/A 05/21/2019   Procedure:  ESOPHAGOGASTRODUODENOSCOPY (EGD) WITH PROPOFOL;  Surgeon: Rogene Houston, MD;  [facility];  Service: Endoscopy;  Laterality: N/A;  2:30pm  . ESOPHAGOGASTRODUODENOSCOPY (EGD) WITH PROPOFOL N/A 06/19/2019   Procedure: ESOPHAGOGASTRODUODENOSCOPY (EGD) WITH PROPOFOL;  Surgeon: Rogene Houston, MD;  [facility];  Service: Endoscopy;  Laterality: N/A;  220pm  . INTRAMEDULLARY (IM) NAIL INTERTROCHANTERIC Left 09/18/2015   Procedure: INTRAMEDULLARY (IM) NAIL INTERTROCHANTRIC;  Surgeon: Carole Civil, MD;  Location: [facility];  Service: Orthopedics;  Laterality: Left;  . TUBAL LIGATION       Current  Meds  Medication Sig  . albuterol (VENTOLIN HFA) 108 (90 Base) MCG/ACT inhaler Inhale 2 puffs into the lungs every 6 (six) hours as needed for wheezing or shortness of breath.  . allopurinol (ZYLOPRIM) 100 MG tablet Take 100 mg by mouth daily.   Marland Kitchen BREO ELLIPTA 200-25 MCG/INH AEPB Inhale 1 puff into the lungs 2 (two) times daily.   Mariane Baumgarten Calcium (STOOL SOFTENER PO) Take 1 tablet by mouth daily.  Marland Kitchen LORazepam (ATIVAN) 0.5 MG tablet Take 0.5 tablets (0.25 mg total) by mouth 2 (two) times daily.  . metoprolol tartrate (LOPRESSOR) 25 MG tablet Take 1 tablet (25 mg total) by mouth 2 (two) times daily.  . mirtazapine (REMERON) 30 MG tablet Take 1 tablet (30 mg total) by mouth at bedtime.  . Multiple Vitamin (MULTIVITAMIN PO) Take 1 tablet by mouth daily.  Marland Kitchen nystatin (MYCOSTATIN) 100000 UNIT/ML suspension Take 5 mLs (500,000 Units total) by mouth 3 (three) times daily as needed.  . OXYGEN Inhale 2 L into the lungs continuous.   . pantoprazole (PROTONIX) 40 MG tablet TAKE 1 TABLET (40 MG TOTAL) BY MOUTH 2 (TWO) TIMES DAILY BEFORE A MEAL.  . promethazine (PHENERGAN) 12.5 MG tablet Take 1 tablet (12.5 mg total) by mouth every 8 (eight) hours as needed for nausea or vomiting.  . vitamin C (ASCORBIC ACID) 250 MG tablet Take 500 mg by mouth daily.      Allergies:   Other and Oxycodone   ROS:   Please see the history of present illness.    All other systems reviewed and are negative.   Labs/Other Tests and Data Reviewed:    Recent Labs: 05/12/2019: B Natriuretic Peptide 69.0; TSH 2.482 05/13/2019: ALT 21 05/15/2019: BUN 12; Creatinine, Ser 0.56; Hemoglobin 11.6; Magnesium 1.8; Platelets 293; Potassium 4.3; Sodium 134   Recent Lipid Panel No results found for: CHOL, TRIG, HDL, CHOLHDL, LDLCALC, LDLDIRECT  Wt Readings from Last 3 Encounters:  12/02/19 103 lb (46.7 kg)  06/19/19 90 lb 15.7 oz (41.3 kg)  06/18/19 91 lb (41.3 kg)     Objective:    Vital Signs:  BP 133/70   Ht 5\' 8"  (1.727 m)    Wt 103 lb (46.7 kg)   BMI 15.66 kg/m    VITAL SIGNS:  reviewed GEN:  alert and oriented RESPIRATORY:  no shortness of breath noted in conversation  PSYCH:  normal affect and mood   ASSESSMENT & PLAN:    1. Gastroesophageal reflux disease, unspecified whether esophagitis present   2. Hoarseness of voice   3. Pulmonary emphysema, unspecified emphysema type ([place])   Time:   Today, I have spent 15 minutes with the patient with telehealth technology discussing the above problems.     Medication Adjustments/Labs and Tests Ordered: Current medicines are reviewed at length with the patient today.  Concerns regarding medicines are outlined above.   Tests Ordered: No orders of the defined  types were placed in this encounter.   Medication Changes: No orders of the defined types were placed in this encounter.     Disposition:  Follow up 4 months  Signed, Perlie Mayo, NP  12/02/2019 3:12 PM     [facility]

## 2019-12-02 NOTE — Patient Instructions (Signed)
I appreciate the opportunity to provide you with care for your health and wellness. Today we discussed: hoarseness and leg swellling   Follow up: 4 months  No labs or referrals today  GI follow up for voice changes, if needed will do ENT referral.  Get some compression socks or hose and keep legs elevated when she is sitting. Try to walk hourly during long periods of sitting.   Please continue to practice social distancing to keep you, your family, and our community safe.  If you must go out, please wear a mask and practice good handwashing.  It was a pleasure to see you and I look forward to continuing to work together on your health and well-being. Please do not hesitate to call the office if you need care or have questions about your care.  Have a wonderful day and week. With Gratitude, Cherly Beach, DNP, AGNP-BC

## 2019-12-02 NOTE — Assessment & Plan Note (Signed)
Encouraged to follow up with GI to see if hoarseness is related to uncontrolled GERD.

## 2019-12-02 NOTE — Assessment & Plan Note (Signed)
Overall controlled, still not smoking no refills on inhalers needed at this time she is encouraged to continue to smoking cessation which she is doing really well with she has developed a little bit of shortness of breath and hoarseness unsure if this is related to worsening COPD versus acid reflux.  Asked her to follow-up with Gi.

## 2019-12-02 NOTE — Assessment & Plan Note (Signed)
Encouraged to make sure she taken Protonix on empty stomach.  Follow-up with GI to see if hoarseness is from uncontrolled acid reflux.  Possible need for referral to ENT.  In addition she does have COPD and has a history of smoking.  Would like to refrain from doing extensive testing as per daughter would like to only do what is necessary.

## 2019-12-02 NOTE — Assessment & Plan Note (Signed)
Encourage compression and elevation during the day.

## 2019-12-14 ENCOUNTER — Telehealth: Payer: Self-pay | Admitting: *Deleted

## 2019-12-14 NOTE — Telephone Encounter (Signed)
Pt is needing allopurinol. She got this from last provider. She forgot to mention in the last visit as she thought you had already prescribed it before so she could just request a refill.

## 2019-12-15 NOTE — Telephone Encounter (Signed)
Please provide this for her. Thank you.

## 2019-12-16 ENCOUNTER — Other Ambulatory Visit: Payer: Self-pay | Admitting: *Deleted

## 2019-12-16 MED ORDER — ALLOPURINOL 100 MG PO TABS: 100.0000 mg | ORAL_TABLET | Freq: Every day | ORAL | 1 refills | Status: DC

## 2019-12-16 NOTE — Telephone Encounter (Signed)
Medication sent to pharmacy  

## 2019-12-28 ENCOUNTER — Telehealth: Payer: Self-pay

## 2019-12-28 NOTE — Telephone Encounter (Signed)
Pts daughter is asking for a refill, LORazepam (ATIVAN) 0.5 MG tablet She is also asking for a Increase in the dosage

## 2019-12-29 NOTE — Telephone Encounter (Signed)
Will refill at current dose, any amount over this is not recommended for elderly populations. She continues to have ongoing increase in anxiety we can look at another option.

## 2019-12-30 ENCOUNTER — Other Ambulatory Visit: Payer: Self-pay | Admitting: Family Medicine

## 2019-12-30 DIAGNOSIS — G4701 Insomnia due to medical condition: Secondary | ICD-10-CM

## 2019-12-30 DIAGNOSIS — F419 Anxiety disorder, unspecified: Secondary | ICD-10-CM

## 2019-12-30 MED ORDER — LORAZEPAM 0.5 MG PO TABS: 0.2500 mg | ORAL_TABLET | Freq: Two times a day (BID) | ORAL | 0 refills | Status: DC

## 2019-12-30 NOTE — Telephone Encounter (Signed)
[name] is calling back in as they have not received the medication --please send in --thanks

## 2019-12-30 NOTE — Telephone Encounter (Signed)
Pt daughter [name] notified it had been sent in and she stated she had it

## 2020-01-11 ENCOUNTER — Other Ambulatory Visit (INDEPENDENT_AMBULATORY_CARE_PROVIDER_SITE_OTHER): Payer: Self-pay | Admitting: Internal Medicine

## 2020-01-12 ENCOUNTER — Other Ambulatory Visit: Payer: Self-pay | Admitting: *Deleted

## 2020-01-12 DIAGNOSIS — G4701 Insomnia due to medical condition: Secondary | ICD-10-CM

## 2020-01-12 DIAGNOSIS — E43 Unspecified severe protein-calorie malnutrition: Secondary | ICD-10-CM

## 2020-01-12 MED ORDER — MIRTAZAPINE 30 MG PO TABS: 30.0000 mg | ORAL_TABLET | Freq: Every day | ORAL | 0 refills | Status: DC

## 2020-01-15 ENCOUNTER — Encounter: Payer: Self-pay | Admitting: *Deleted

## 2020-01-15 ENCOUNTER — Other Ambulatory Visit: Payer: Self-pay | Admitting: *Deleted

## 2020-01-15 MED ORDER — ALLOPURINOL 100 MG PO TABS: 100.0000 mg | ORAL_TABLET | Freq: Every day | ORAL | 1 refills | Status: DC

## 2020-01-27 ENCOUNTER — Other Ambulatory Visit: Payer: Self-pay | Admitting: Family Medicine

## 2020-01-27 ENCOUNTER — Telehealth: Payer: Self-pay

## 2020-01-27 DIAGNOSIS — G4701 Insomnia due to medical condition: Secondary | ICD-10-CM

## 2020-01-27 DIAGNOSIS — F419 Anxiety disorder, unspecified: Secondary | ICD-10-CM

## 2020-01-27 MED ORDER — LORAZEPAM 0.5 MG PO TABS: 0.2500 mg | ORAL_TABLET | Freq: Two times a day (BID) | ORAL | 0 refills | Status: DC

## 2020-01-27 NOTE — Telephone Encounter (Signed)
Please send Lorazepam to the pharmacy

## 2020-01-27 NOTE — Telephone Encounter (Signed)
Patient requests refill lorazepam

## 2020-02-08 ENCOUNTER — Other Ambulatory Visit: Payer: Self-pay | Admitting: *Deleted

## 2020-02-08 ENCOUNTER — Telehealth: Payer: Self-pay

## 2020-02-08 MED ORDER — METOPROLOL TARTRATE 25 MG PO TABS: 25.0000 mg | ORAL_TABLET | Freq: Two times a day (BID) | ORAL | 1 refills | Status: DC

## 2020-02-08 NOTE — Telephone Encounter (Signed)
Pt needs Metoprolol called in

## 2020-02-08 NOTE — Telephone Encounter (Signed)
This medication has been sent to the pharmacy  ?

## 2020-02-16 ENCOUNTER — Telehealth: Payer: Self-pay

## 2020-02-16 NOTE — Telephone Encounter (Signed)
Spoke with Taylor Cannon pt daugther pt legs are swelling since Sunday night and she was complaining of them going numb and hurting. They are not hurting or going numb and they are not as swollen but wanted to know if her appt should be moved up. Her appt is 02-25-20 and that is the soonest available appt unless we have a cancellation. Just wanted to know your opinion

## 2020-02-16 NOTE — Telephone Encounter (Signed)
If she is have sensation changes, she should be seen sooner than later. I recommend ED incase something is having that is more pressing.

## 2020-02-16 NOTE — Telephone Encounter (Signed)
Taylor Cannon wanting a call back regarding the pt

## 2020-02-16 NOTE — Telephone Encounter (Signed)
Pt daughter [name] said she got home from work and her legs look better she hasnt complained of them hurting advised to take to ER to have them looked at. She stated she would wait since they were not hurting her or looking bad and if they were no better she would go to Er tomorrow. Advised her if we had a cancellation in the AM I would call but if it got worse to go to ER with verbal understanding

## 2020-02-25 ENCOUNTER — Other Ambulatory Visit: Payer: Self-pay

## 2020-02-25 ENCOUNTER — Ambulatory Visit (INDEPENDENT_AMBULATORY_CARE_PROVIDER_SITE_OTHER): Payer: Medicare HMO | Admitting: Family Medicine

## 2020-02-25 ENCOUNTER — Encounter: Payer: Self-pay | Admitting: Family Medicine

## 2020-02-25 VITALS — BP 126/78 | HR 90 | Temp 97.2°F | Ht 68.0 in | Wt 105.8 lb

## 2020-02-25 DIAGNOSIS — G4701 Insomnia due to medical condition: Secondary | ICD-10-CM

## 2020-02-25 DIAGNOSIS — M7989 Other specified soft tissue disorders: Secondary | ICD-10-CM | POA: Diagnosis not present

## 2020-02-25 DIAGNOSIS — E43 Unspecified severe protein-calorie malnutrition: Secondary | ICD-10-CM | POA: Diagnosis not present

## 2020-02-25 MED ORDER — LORAZEPAM 0.5 MG PO TABS: 0.2500 mg | ORAL_TABLET | Freq: Two times a day (BID) | ORAL | 0 refills | Status: DC

## 2020-02-25 MED ORDER — MIRTAZAPINE 45 MG PO TABS: 45.0000 mg | ORAL_TABLET | Freq: Every day | ORAL | 3 refills | Status: DC

## 2020-02-25 NOTE — Addendum Note (Signed)
Addended by: Zacarias Pontes R on: 02/25/2020 04:15 PM   Modules accepted: Orders

## 2020-02-25 NOTE — Assessment & Plan Note (Signed)
Refill on Ativan and Remeron.  Reviewed side effects with patient and daughter.  High risk for falls. Reviewed side effects, risks and benefits of medication.   Patient acknowledged agreement and understanding of the plan.

## 2020-02-25 NOTE — Assessment & Plan Note (Signed)
Improved! Encouraged to continue the use of boost and eating.  Has had some eating change in the last month or so.  Will increase Remeron to 45 mg as she reports she is not sleeping as well.  Hopefully this will help her with sleeping and eating.  Wt Readings from Last 3 Encounters:  02/25/20 105 lb 12.8 oz (48 kg)  12/02/19 103 lb (46.7 kg)  06/19/19 90 lb 15.7 oz (41.3 kg)

## 2020-02-25 NOTE — Progress Notes (Signed)
Subjective:  Patient ID: Taylor Cannon, female    DOB: 1933-09-20  Age: 86 y.o. MRN: [id number]  CC:  Chief Complaint  Patient presents with  . Foot Swelling    both legs and feet swelling x2 weeks was really bad monday before last pt daughter called in advised to go to ER or urgent care pt daughter said this has gotten better over the last few weeks but still swollen       HPI  HPI  Taylor Cannon is an 86 year old female patient of mine.  She presents today with foot/leg swelling.  Reports that both legs and feet were swelling 2 weeks ago.  It was very bad.  Monday before last she had called again and was advised to go to the urgent care or emergency room.  Which they never went in to do.  However she reports that it seemed to get a little bit better.  During that weekend before she was having a lot of time outside the home which she had not really been outside the home and was up on her legs a whole lot more alert not keeping them elevated when she came in.  She denies having any achiness or pain.  Pulses are good.  There is dusky color in the foot and ankle area.  However capillary refill is 3 seconds or less.  Slight pitting edema in the dorsal pedal area.  Denies having any injuries or fall.  Denies having any chest pain, shortness of breath, headaches, vision changes.  Does report some lightheadedness.  Reports that she does not drink as much fluid as she could.  Today patient denies signs and symptoms of COVID 19 infection including fever, chills, cough, shortness of breath, and headache. Past Medical, Surgical, Social History, Allergies, and Medications have been Reviewed.   Past Medical History:  Diagnosis Date  . Anxiety   . Benign paroxysmal positional vertigo 10/05/2014  . Closed fracture of intertrochanteric section of femur ([place])   . Closed left hip fracture ([place]) 09/16/2015  . COPD (chronic obstructive pulmonary disease) ([place])   . Depression   . Fall 09/16/2015  . Fall at  home, initial encounter 02/28/2017  . GERD (gastroesophageal reflux disease)   . Intertrochanteric fracture of left femur ([place]) 09/18/2015  . Malnutrition ([place]) 05/26/2019  . On home oxygen therapy    continuous at this time  . Oxygen deficiency   . S/P ORIF (open reduction internal fixation) fracture left hip 09/18/15 IM nail  05/27/2018  . TIA (transient ischemic attack)     Current Meds  Medication Sig  . albuterol (VENTOLIN HFA) 108 (90 Base) MCG/ACT inhaler Inhale 2 puffs into the lungs every 6 (six) hours as needed for wheezing or shortness of breath.  . allopurinol (ZYLOPRIM) 100 MG tablet Take 1 tablet (100 mg total) by mouth daily.  Marland Kitchen BREO ELLIPTA 200-25 MCG/INH AEPB Inhale 1 puff into the lungs 2 (two) times daily.   Mariane Baumgarten Calcium (STOOL SOFTENER PO) Take 1 tablet by mouth daily.  Marland Kitchen LORazepam (ATIVAN) 0.5 MG tablet Take 0.5 tablets (0.25 mg total) by mouth 2 (two) times daily.  . metoprolol tartrate (LOPRESSOR) 25 MG tablet Take 1 tablet (25 mg total) by mouth 2 (two) times daily.  . mirtazapine (REMERON) 45 MG tablet Take 1 tablet (45 mg total) by mouth at bedtime.  . Multiple Vitamin (MULTIVITAMIN PO) Take 1 tablet by mouth daily.  Marland Kitchen nystatin (MYCOSTATIN) 100000 UNIT/ML suspension Take 5  mLs (500,000 Units total) by mouth 3 (three) times daily as needed.  . OXYGEN Inhale 2 L into the lungs continuous.   . pantoprazole (PROTONIX) 40 MG tablet TAKE 1 TABLET (40 MG TOTAL) BY MOUTH 2 (TWO) TIMES DAILY BEFORE A MEAL  . promethazine (PHENERGAN) 12.5 MG tablet Take 1 tablet (12.5 mg total) by mouth every 8 (eight) hours as needed for nausea or vomiting.  . vitamin C (ASCORBIC ACID) 250 MG tablet Take 500 mg by mouth daily.   . [DISCONTINUED] LORazepam (ATIVAN) 0.5 MG tablet Take 0.5 tablets (0.25 mg total) by mouth 2 (two) times daily.  . [DISCONTINUED] mirtazapine (REMERON) 30 MG tablet Take 1 tablet (30 mg total) by mouth at bedtime.    ROS:  Review of Systems  Constitutional:  Negative.   HENT: Negative.   Eyes: Negative.   Respiratory: Negative.   Cardiovascular: Positive for leg swelling.  Gastrointestinal: Negative.   Genitourinary: Negative.   Musculoskeletal: Negative.   Skin: Negative.   Neurological: Negative.   Endo/Heme/Allergies: Negative.   Psychiatric/Behavioral: Negative.   All other systems reviewed and are negative.    Objective:   Today's Vitals: BP 126/78 (BP Location: Left Arm, Patient Position: Sitting, Cuff Size: Normal)   Pulse 90   Temp (!) 97.2 F (36.2 C) (Temporal)   Ht 5\' 8"  (1.727 m)   Wt 105 lb 12.8 oz (48 kg)   SpO2 91%   BMI 16.09 kg/m  Vitals with BMI 02/25/2020 12/02/2019 06/19/2019  Height 5\' 8"  5\' 8"  -  Weight 105 lbs 13 oz 103 lbs -  BMI 56.21 30.86 -  Systolic 578 469 629  Diastolic 78 70 70  Pulse 90 - 86     Physical Exam Vitals and nursing note reviewed.  Constitutional:      Appearance: Normal appearance. She is well-developed, well-groomed and underweight.  HENT:     Head: Normocephalic and atraumatic.     Right Ear: External ear normal.     Left Ear: External ear normal.     Mouth/Throat:     Comments: Mask in place  Eyes:     General:        Right eye: No discharge.        Left eye: No discharge.     Conjunctiva/sclera: Conjunctivae normal.  Cardiovascular:     Rate and Rhythm: Normal rate and regular rhythm.     Pulses:          Dorsalis pedis pulses are 1+ on the right side and 1+ on the left side.     Heart sounds: Normal heart sounds.     Comments: Predominately swelling ankle to foot Pulmonary:     Effort: Pulmonary effort is normal.     Breath sounds: Normal breath sounds.  Musculoskeletal:        General: Swelling present.     Cervical back: Normal range of motion and neck supple.     Right lower leg: 1+ Pitting Edema present.     Left lower leg: 1+ Pitting Edema present.  Feet:     Comments: Dusky coloration of bilateral feet 1+ pitting edema of feet Skin:    General: Skin  is warm.  Neurological:     General: No focal deficit present.     Mental Status: She is alert and oriented to person, place, and time.  Psychiatric:        Mood and Affect: Mood normal.        Behavior: Behavior  normal. Behavior is cooperative.        Thought Content: Thought content normal.        Judgment: Judgment normal.     Assessment   1. Severe protein-calorie malnutrition ([place])   2. Insomnia due to medical condition   3. Anxiety     Tests ordered Orders Placed This Encounter  Procedures  . CBC  . COMPLETE METABOLIC PANEL WITH GFR     Plan: Please see assessment and plan per problem list above.   Meds ordered this encounter  Medications  . mirtazapine (REMERON) 45 MG tablet    Sig: Take 1 tablet (45 mg total) by mouth at bedtime.    Dispense:  30 tablet    Refill:  3    Order Specific Question:   Supervising Provider    Answer:   SIMPSON, MARGARET E [[id number]]  . LORazepam (ATIVAN) 0.5 MG tablet    Sig: Take 0.5 tablets (0.25 mg total) by mouth 2 (two) times daily.    Dispense:  30 tablet    Refill:  0    Order Specific Question:   Supervising Provider    Answer:   Fayrene Helper [[id number]]    Patient to follow-up in 2 weeks.  Perlie Mayo, NP

## 2020-02-25 NOTE — Patient Instructions (Signed)
I appreciate the opportunity to provide you with care for your health and wellness. Today we discussed: leg swelling   Follow up: 2 weeks (can be by phone is desired)  Labs today  No referrals today  Compression socks/stockings 4-8 hours daily KEEP LEGS ELEVATED :)  Please continue to practice social distancing to keep you, your family, and our community safe.  If you must go out, please wear a mask and practice good handwashing.  It was a pleasure to see you and I look forward to continuing to work together on your health and well-being. Please do not hesitate to call the office if you need care or have questions about your care.  Have a wonderful day and week. With Gratitude, Cherly Beach, DNP, AGNP-BC

## 2020-02-25 NOTE — Assessment & Plan Note (Signed)
She is encouraged again to do compression elevation during the day.  She had leg swelling back in March.  They had improved but she is not wearing compression or elevating this time.  And is gotten a lot worse.  Secondary to the heat, being up and about more frequently than she was probably made the situation worse.  She has no shortness of breath or signs or symptoms of congestive heart failure or fluid overload at this time.  Will be getting some updated labs to assess kidney function.

## 2020-02-29 ENCOUNTER — Ambulatory Visit (INDEPENDENT_AMBULATORY_CARE_PROVIDER_SITE_OTHER): Payer: Medicare HMO | Admitting: Gastroenterology

## 2020-03-10 ENCOUNTER — Telehealth (INDEPENDENT_AMBULATORY_CARE_PROVIDER_SITE_OTHER): Payer: Medicare HMO | Admitting: Family Medicine

## 2020-03-10 ENCOUNTER — Other Ambulatory Visit: Payer: Self-pay

## 2020-03-10 ENCOUNTER — Encounter: Payer: Self-pay | Admitting: Family Medicine

## 2020-03-10 VITALS — BP 126/78 | Ht 68.0 in | Wt 105.0 lb

## 2020-03-10 DIAGNOSIS — M7989 Other specified soft tissue disorders: Secondary | ICD-10-CM

## 2020-03-10 NOTE — Progress Notes (Signed)
Virtual Visit via Telephone Note   This visit type was conducted due to national recommendations for restrictions regarding the COVID-19 Pandemic (e.g. social distancing) in an effort to limit this patient's exposure and mitigate transmission in our community.  Due to her co-morbid illnesses, this patient is at least at moderate risk for complications without adequate follow up.  This format is felt to be most appropriate for this patient at this time.  The patient did not have access to video technology/had technical difficulties with video requiring transitioning to audio format only (telephone).  All issues noted in this document were discussed and addressed.  No physical exam could be performed with this format.    Evaluation Performed:  Follow-up visit  Date:  03/10/2020   ID:  Taylor Cannon, DOB 03-19-1934, MRN [id number]  Patient Location: Home Provider Location: Office/Clinic  Location of Patient: Home Location of Provider: Telehealth Consent was obtain for visit to be over via telehealth. I verified that I am speaking with the correct person using two identifiers.  PCP:  Perlie Mayo, NP   Chief Complaint:  2 week follow up  History of Present Illness:    Taylor Cannon is a 86 y.o. female presents for phone follow-up secondary to leg swelling.  Was seen 2 weeks ago in the office.  At that time she was encouraged to do compression socks and elevation during the day.  She reports today that that is much improved.  Her lab work was checked to make sure that her kidney function was stable and there is no overload.  She denies having any shortness of breath or signs or symptoms of congestive heart failure or fluid overload at this time.  Her kidney function was stable little bit lower potassium low in protein and demonstrated iron deficiency anemia.  I have encouraged her to do some supplementation and we will follow-up with labs at her next appointment to see if it is  helped.  The patient does not have symptoms concerning for COVID-19 infection (fever, chills, cough, or new shortness of breath).   Past Medical, Surgical, Social History, Allergies, and Medications have been Reviewed.  Past Medical History:  Diagnosis Date  . Anxiety   . Benign paroxysmal positional vertigo 10/05/2014  . Closed fracture of intertrochanteric section of femur ([place])   . Closed left hip fracture ([place]) 09/16/2015  . COPD (chronic obstructive pulmonary disease) ([place])   . Depression   . Fall 09/16/2015  . Fall at home, initial encounter 02/28/2017  . GERD (gastroesophageal reflux disease)   . Intertrochanteric fracture of left femur ([place]) 09/18/2015  . Malnutrition ([place]) 05/26/2019  . On home oxygen therapy    continuous at this time  . Oxygen deficiency   . S/P ORIF (open reduction internal fixation) fracture left hip 09/18/15 IM nail  05/27/2018  . TIA (transient ischemic attack)    Past Surgical History:  Procedure Laterality Date  . APPENDECTOMY    . BALLOON DILATION N/A 07/24/2013   Procedure: BALLOON DILATION to 13.8mm;  Surgeon: Rogene Houston, MD;  Location: [facility];  Service: Endoscopy;  Laterality: N/A;  . BALLOON DILATION N/A 11/11/2014   Procedure: BALLOON DILATION;  Surgeon: Rogene Houston, MD;  [facility];  Service: Endoscopy;  Laterality: N/A;  . CARDIAC ELECTROPHYSIOLOGY STUDY AND ABLATION    . CATARACT EXTRACTION Bilateral   . CERVICAL CONE BIOPSY    . ESOPHAGEAL DILATION N/A 11/30/2014   Procedure: ESOPHAGEAL  DILATION;  Surgeon: Rogene Houston, MD;  Location: [facility];  Service: Endoscopy;  Laterality: N/A;  Balloon, 15, 16.5  . ESOPHAGEAL DILATION N/A 06/03/2015   Procedure: ESOPHAGEAL DILATION WITH 16.5 MM BALLOON ;  Surgeon: Rogene Houston, MD;  Location: [facility];  Service: Endoscopy;  Laterality: N/A;  . ESOPHAGEAL DILATION N/A 09/09/2015   Procedure: ESOPHAGEAL DILATION;  Surgeon: Rogene Houston, MD;  [facility];  Service:  Endoscopy;  Laterality: N/A;  . ESOPHAGEAL DILATION N/A 05/04/2016   Procedure: ESOPHAGEAL DILATION;  Surgeon: Rogene Houston, MD;  [facility];  Service: Endoscopy;  Laterality: N/A;  . ESOPHAGEAL DILATION N/A 06/22/2016   Procedure: ESOPHAGEAL DILATION;  Surgeon: Rogene Houston, MD;  [facility];  Service: Endoscopy;  Laterality: N/A;  . ESOPHAGEAL DILATION N/A 05/21/2019   Procedure: ESOPHAGEAL DILATION;  Surgeon: Rogene Houston, MD;  [facility];  Service: Endoscopy;  Laterality: N/A;  . ESOPHAGEAL DILATION N/A 06/19/2019   Procedure: ESOPHAGEAL DILATION;  Surgeon: Rogene Houston, MD;  [facility];  Service: Endoscopy;  Laterality: N/A;  . ESOPHAGOGASTRODUODENOSCOPY N/A 11/11/2014   Procedure: ESOPHAGOGASTRODUODENOSCOPY (EGD);  Surgeon: Rogene Houston, MD;  [facility];  Service: Endoscopy;  Laterality: N/A;  125-rescheduled 11/11/14 @ 10:30am Ann notified pt  . ESOPHAGOGASTRODUODENOSCOPY (EGD) WITH ESOPHAGEAL DILATION N/A 08/20/2013   Procedure: ESOPHAGOGASTRODUODENOSCOPY (EGD) WITH ESOPHAGEAL DILATION;  Surgeon: Rogene Houston, MD;  [facility];  Service: Endoscopy;  Laterality: N/A;  345-moved to 730 Ann notified pt  . ESOPHAGOGASTRODUODENOSCOPY (EGD) WITH PROPOFOL N/A 07/24/2013   Procedure: ESOPHAGOGASTRODUODENOSCOPY (EGD) WITH PROPOFOL UNDER FLUOROSCOPY;  Surgeon: Rogene Houston, MD;  [facility];  Service: Endoscopy;  Laterality: N/A;  . ESOPHAGOGASTRODUODENOSCOPY (EGD) WITH PROPOFOL N/A 11/30/2014   Procedure: ESOPHAGOGASTRODUODENOSCOPY (EGD) WITH PROPOFOL;  Surgeon: Rogene Houston, MD;  [facility];  Service: Endoscopy;  Laterality: N/A;  fluoro not needed  . ESOPHAGOGASTRODUODENOSCOPY (EGD) WITH PROPOFOL N/A 06/03/2015   Procedure: ESOPHAGOGASTRODUODENOSCOPY (EGD) WITH PROPOFOL; HIATUS AT 35 CM AND GE JUNCTION 40 CM;  Surgeon: Rogene Houston, MD;  [facility]: [facility];  Service: Endoscopy;  Laterality:  N/A;  . ESOPHAGOGASTRODUODENOSCOPY (EGD) WITH PROPOFOL N/A 09/09/2015   Procedure: ESOPHAGOGASTRODUODENOSCOPY (EGD) WITH PROPOFOL;  Surgeon: Rogene Houston, MD;  [facility];  Service: Endoscopy;  Laterality: N/A;  8:15  . ESOPHAGOGASTRODUODENOSCOPY (EGD) WITH PROPOFOL N/A 05/04/2016   Procedure: ESOPHAGOGASTRODUODENOSCOPY (EGD) WITH PROPOFOL;  Surgeon: Rogene Houston, MD;  [facility];  Service: Endoscopy;  Laterality: N/A;  9:30  . ESOPHAGOGASTRODUODENOSCOPY (EGD) WITH PROPOFOL N/A 06/22/2016   Procedure: ESOPHAGOGASTRODUODENOSCOPY (EGD) WITH PROPOFOL;  Surgeon: Rogene Houston, MD;  [facility];  Service: Endoscopy;  Laterality: N/A;  11:20  . ESOPHAGOGASTRODUODENOSCOPY (EGD) WITH PROPOFOL N/A 05/21/2019   Procedure: ESOPHAGOGASTRODUODENOSCOPY (EGD) WITH PROPOFOL;  Surgeon: Rogene Houston, MD;  [facility];  Service: Endoscopy;  Laterality: N/A;  2:30pm  . ESOPHAGOGASTRODUODENOSCOPY (EGD) WITH PROPOFOL N/A 06/19/2019   Procedure: ESOPHAGOGASTRODUODENOSCOPY (EGD) WITH PROPOFOL;  Surgeon: Rogene Houston, MD;  [facility];  Service: Endoscopy;  Laterality: N/A;  220pm  . INTRAMEDULLARY (IM) NAIL INTERTROCHANTERIC Left 09/18/2015   Procedure: INTRAMEDULLARY (IM) NAIL INTERTROCHANTRIC;  Surgeon: Carole Civil, MD;  Location: [facility];  Service: Orthopedics;  Laterality: Left;  . TUBAL LIGATION       Current Meds  Medication Sig  . albuterol (VENTOLIN HFA)  108 (90 Base) MCG/ACT inhaler Inhale 2 puffs into the lungs every 6 (six) hours as needed for wheezing or shortness of breath.  . allopurinol (ZYLOPRIM) 100 MG tablet Take 1 tablet (100 mg total) by mouth daily.  Marland Kitchen BREO ELLIPTA 200-25 MCG/INH AEPB Inhale 1 puff into the lungs 2 (two) times daily.   Mariane Baumgarten Calcium (STOOL SOFTENER PO) Take 1 tablet by mouth daily.  Marland Kitchen LORazepam (ATIVAN) 0.5 MG tablet Take 0.5 tablets (0.25 mg total) by mouth 2 (two) times daily.  . mirtazapine  (REMERON) 45 MG tablet Take 1 tablet (45 mg total) by mouth at bedtime.  . Multiple Vitamin (MULTIVITAMIN PO) Take 1 tablet by mouth daily.  Marland Kitchen nystatin (MYCOSTATIN) 100000 UNIT/ML suspension Take 5 mLs (500,000 Units total) by mouth 3 (three) times daily as needed.  . OXYGEN Inhale 2 L into the lungs continuous.   . pantoprazole (PROTONIX) 40 MG tablet TAKE 1 TABLET (40 MG TOTAL) BY MOUTH 2 (TWO) TIMES DAILY BEFORE A MEAL  . promethazine (PHENERGAN) 12.5 MG tablet Take 1 tablet (12.5 mg total) by mouth every 8 (eight) hours as needed for nausea or vomiting.  . vitamin C (ASCORBIC ACID) 250 MG tablet Take 500 mg by mouth daily.      Allergies:   Other and Oxycodone   ROS:   Please see the history of present illness.    All other systems reviewed and are negative.   Labs/Other Tests and Data Reviewed:    Recent Labs: 05/12/2019: B Natriuretic Peptide 69.0; TSH 2.482 05/13/2019: ALT 21 05/15/2019: BUN 12; Creatinine, Ser 0.56; Hemoglobin 11.6; Magnesium 1.8; Platelets 293; Potassium 4.3; Sodium 134   Recent Lipid Panel No results found for: CHOL, TRIG, HDL, CHOLHDL, LDLCALC, LDLDIRECT  Wt Readings from Last 3 Encounters:  03/10/20 105 lb (47.6 kg)  02/25/20 105 lb 12.8 oz (48 kg)  12/02/19 103 lb (46.7 kg)     Objective:    Vital Signs:  BP 126/78   Ht 5\' 8"  (1.727 m)   Wt 105 lb (47.6 kg)   BMI 15.97 kg/m    VITAL SIGNS:  reviewed GEN:  Alert and oriented RESPIRATORY:  No shortness of breath in conversation PSYCH:  normal affect and mood  ASSESSMENT & PLAN:   1. Leg swelling   Time:   Today, I have spent 10 minutes with the patient with telehealth technology discussing the above problems.     Medication Adjustments/Labs and Tests Ordered: Current medicines are reviewed at length with the patient today.  Concerns regarding medicines are outlined above.   Tests Ordered: No orders of the defined types were placed in this encounter.   Medication Changes: No orders of  the defined types were placed in this encounter.   Disposition:  Follow up 3-4 months  Signed, Perlie Mayo, NP  03/10/2020 3:06 PM     [facility]

## 2020-03-10 NOTE — Patient Instructions (Signed)
I appreciate the opportunity to provide you with care for your health and wellness. Today we discussed: Leg swelling  Follow up: 3 to 4 months  No labs or referrals today  We will check labs and weight in the next office appointment. Please continue the compression socks, elevation. Please work on eating/drinking more whole meals.  Can use Ensure or boost as supplementation.   Please continue to practice social distancing to keep you, your family, and our community safe.  If you must go out, please wear a mask and practice good handwashing.  It was a pleasure to see you and I look forward to continuing to work together on your health and well-being. Please do not hesitate to call the office if you need care or have questions about your care.  Have a wonderful day and week. With Gratitude, Cherly Beach, DNP, AGNP-BC

## 2020-03-10 NOTE — Assessment & Plan Note (Signed)
Was seen 2 weeks ago in the office.  At that time she was encouraged to do compression socks and elevation during the day.  She reports today that that is much improved.  Her lab work was checked to make sure that her kidney function was stable and there is no overload.  She denies having any shortness of breath or signs or symptoms of congestive heart failure or fluid overload at this time.  Her kidney function was stable little bit lower potassium low in protein and demonstrated iron deficiency anemia.  I have encouraged her to do some supplementation and we will follow-up with labs at her next appointment to see if it is helped.

## 2020-03-21 ENCOUNTER — Other Ambulatory Visit: Payer: Self-pay

## 2020-03-21 DIAGNOSIS — G4701 Insomnia due to medical condition: Secondary | ICD-10-CM

## 2020-03-21 DIAGNOSIS — E43 Unspecified severe protein-calorie malnutrition: Secondary | ICD-10-CM

## 2020-03-21 MED ORDER — MIRTAZAPINE 45 MG PO TABS: 45.0000 mg | ORAL_TABLET | Freq: Every day | ORAL | 0 refills | Status: DC

## 2020-03-28 ENCOUNTER — Telehealth: Payer: Self-pay

## 2020-03-28 NOTE — Telephone Encounter (Signed)
Pt is requesting a refill on her lorazepam would you like to send in ?

## 2020-03-28 NOTE — Telephone Encounter (Signed)
Pt needs Lorazepam called in

## 2020-03-29 ENCOUNTER — Telehealth: Payer: Self-pay | Admitting: Family Medicine

## 2020-03-29 ENCOUNTER — Other Ambulatory Visit: Payer: Self-pay | Admitting: Family Medicine

## 2020-03-29 DIAGNOSIS — G4701 Insomnia due to medical condition: Secondary | ICD-10-CM

## 2020-03-29 MED ORDER — LORAZEPAM 0.5 MG PO TABS: 0.2500 mg | ORAL_TABLET | Freq: Two times a day (BID) | ORAL | 0 refills | Status: DC

## 2020-03-29 NOTE — Telephone Encounter (Signed)
Pt Daughter Leeanne Rio called and said [facility] still hasnt received refill request for lorazepam. Says she spoke to someone yesterday about refill.

## 2020-03-29 NOTE — Telephone Encounter (Signed)
I sent it already  

## 2020-03-29 NOTE — Telephone Encounter (Signed)
Called [facility] pt daughter aware the medication is ready for pick up

## 2020-03-29 NOTE — Telephone Encounter (Signed)
Taylor Cannon would like to know if someone can resend the fax sent [facility] [place]. I did tell her that it was sent already but She just spoke to the pharmacy there and they claim they have not received anything from [facility] as of 4:00 pm

## 2020-03-29 NOTE — Telephone Encounter (Signed)
Pt daughter calling back about lorazepam would you like to refill?

## 2020-04-01 ENCOUNTER — Ambulatory Visit: Payer: Medicare HMO | Admitting: Family Medicine

## 2020-04-06 ENCOUNTER — Other Ambulatory Visit: Payer: Self-pay

## 2020-04-06 MED ORDER — ALLOPURINOL 100 MG PO TABS: 100.0000 mg | ORAL_TABLET | Freq: Every day | ORAL | 5 refills | Status: DC

## 2020-04-11 ENCOUNTER — Other Ambulatory Visit: Payer: Self-pay

## 2020-04-11 MED ORDER — METOPROLOL TARTRATE 25 MG PO TABS: 25.0000 mg | ORAL_TABLET | Freq: Two times a day (BID) | ORAL | 1 refills | Status: DC

## 2020-04-20 ENCOUNTER — Telehealth: Payer: Self-pay

## 2020-04-20 ENCOUNTER — Other Ambulatory Visit: Payer: Self-pay

## 2020-04-20 DIAGNOSIS — E43 Unspecified severe protein-calorie malnutrition: Secondary | ICD-10-CM

## 2020-04-20 DIAGNOSIS — G4701 Insomnia due to medical condition: Secondary | ICD-10-CM

## 2020-04-20 MED ORDER — MIRTAZAPINE 45 MG PO TABS: 45.0000 mg | ORAL_TABLET | Freq: Every day | ORAL | 1 refills | Status: DC

## 2020-04-20 NOTE — Telephone Encounter (Signed)
Pt needing refill on Mirtazapine 45 mg

## 2020-04-20 NOTE — Telephone Encounter (Signed)
Refill sent in

## 2020-05-02 ENCOUNTER — Telehealth: Payer: Self-pay | Admitting: *Deleted

## 2020-05-02 NOTE — Telephone Encounter (Signed)
Janelle called for pt she is requesting prescription for l[facility]azepam be [facility] to [facility] [place]

## 2020-05-03 ENCOUNTER — Other Ambulatory Visit: Payer: Self-pay | Admitting: Family Medicine

## 2020-05-03 DIAGNOSIS — G4701 Insomnia due to medical condition: Secondary | ICD-10-CM

## 2020-05-03 MED ORDER — LORAZEPAM 0.5 MG PO TABS: 0.2500 mg | ORAL_TABLET | Freq: Two times a day (BID) | ORAL | 0 refills | Status: DC | PRN

## 2020-05-03 NOTE — Telephone Encounter (Signed)
Noted, I have printed this for signature and fax

## 2020-05-03 NOTE — Telephone Encounter (Signed)
[facility] has [facility] [place]

## 2020-06-02 ENCOUNTER — Other Ambulatory Visit: Payer: Self-pay | Admitting: Family Medicine

## 2020-06-02 ENCOUNTER — Telehealth: Payer: Self-pay | Admitting: *Deleted

## 2020-06-02 DIAGNOSIS — G4701 Insomnia due to medical condition: Secondary | ICD-10-CM

## 2020-06-02 MED ORDER — LORAZEPAM 0.5 MG PO TABS: 0.2500 mg | ORAL_TABLET | Freq: Two times a day (BID) | ORAL | 0 refills | Status: DC | PRN

## 2020-06-02 NOTE — Telephone Encounter (Signed)
Taylor Cannon wanted to see about getting a refill on lorazepam for [facility] [place]

## 2020-06-02 NOTE — Telephone Encounter (Signed)
Refill ordered

## 2020-06-02 NOTE — Telephone Encounter (Signed)
LVM letting pt know medication had been sent

## 2020-06-17 ENCOUNTER — Other Ambulatory Visit (INDEPENDENT_AMBULATORY_CARE_PROVIDER_SITE_OTHER): Payer: Self-pay | Admitting: Internal Medicine

## 2020-07-06 ENCOUNTER — Other Ambulatory Visit: Payer: Self-pay | Admitting: Family Medicine

## 2020-07-06 DIAGNOSIS — G4701 Insomnia due to medical condition: Secondary | ICD-10-CM

## 2020-07-07 ENCOUNTER — Other Ambulatory Visit: Payer: Self-pay | Admitting: Family Medicine

## 2020-07-07 ENCOUNTER — Telehealth: Payer: Self-pay | Admitting: Family Medicine

## 2020-07-07 NOTE — Telephone Encounter (Signed)
Noted  

## 2020-07-07 NOTE — Telephone Encounter (Signed)
Patient needing a refill on l[facility]azepam [facility] [place] [facility] p# [phone]

## 2020-07-07 NOTE — Telephone Encounter (Signed)
See below

## 2020-07-13 ENCOUNTER — Encounter: Payer: Self-pay | Admitting: Family Medicine

## 2020-07-13 ENCOUNTER — Telehealth (INDEPENDENT_AMBULATORY_CARE_PROVIDER_SITE_OTHER): Payer: Medicare HMO | Admitting: Family Medicine

## 2020-07-13 ENCOUNTER — Other Ambulatory Visit: Payer: Self-pay

## 2020-07-13 DIAGNOSIS — E43 Unspecified severe protein-calorie malnutrition: Secondary | ICD-10-CM | POA: Diagnosis not present

## 2020-07-13 DIAGNOSIS — F419 Anxiety disorder, unspecified: Secondary | ICD-10-CM | POA: Diagnosis not present

## 2020-07-13 NOTE — Patient Instructions (Addendum)
  HAPPY FALL!  I appreciate the opportunity to provide you with care for your health and wellness. Today we discussed: sleep/anxiety and weight   Follow up: 3 months in office for weight check  No labs or referrals today  Have a wonderful Holiday Season!  Please continue to practice social distancing to keep you, your family, and our community safe.  If you must go out, please wear a mask and practice good handwashing.  It was a pleasure to see you and I look forward to continuing to work together on your health and well-being. Please do not hesitate to call the office if you need care or have questions about your care.  Have a wonderful day and week. With Gratitude, Cherly Beach, DNP, AGNP-BC

## 2020-07-13 NOTE — Progress Notes (Signed)
Virtual Visit via Telephone Note   This visit type was conducted due to national recommendations for restrictions regarding the COVID-19 Pandemic (e.g. social distancing) in an effort to limit this patient's exposure and mitigate transmission in our community.  Due to her co-morbid illnesses, this patient is at least at moderate risk for complications without adequate follow up.  This format is felt to be most appropriate for this patient at this time.  The patient did not have access to video technology/had technical difficulties with video requiring transitioning to audio format only (telephone).  All issues noted in this document were discussed and addressed.  No physical exam could be performed with this format.   Evaluation Performed:  Follow-up visit  Date:  07/13/2020   ID:  Taylor Cannon, DOB 1933-11-26, MRN [id number]  Patient Location: Home Provider Location: Office/Clinic  Location of Patient: Home Location of Provider: Telehealth Consent was obtain for visit to be over via telehealth. I verified that I am speaking with the correct person using two identifiers.  PCP:  Perlie Mayo, NP   Chief Complaint: Chronic condition follow-up  History of Present Illness:    Taylor Cannon is a 86 y.o. female with history as stated below. Daughter [name] is on the line as well and helps provide history. Reports that having chewing and swallowing aregood, but.  Appetite is not good. On Remeron no improvement, is using Ensure at times. Denies changes in bowel or bladder habits, no blood noticed. Denies having changes in memory. Denies having falls. Denies skin, hearing or vision changes.  Reports taking medications as directed.  The patient does not have symptoms concerning for COVID-19 infection (fever, chills, cough, or new shortness of breath).   Past Medical, Surgical, Social History, Allergies, and Medications have been Reviewed.  Past Medical History:  Diagnosis Date    . Anxiety   . Benign paroxysmal positional vertigo 10/05/2014  . Closed fracture of intertrochanteric section of femur ([place])   . Closed left hip fracture ([place]) 09/16/2015  . COPD (chronic obstructive pulmonary disease) ([place])   . Depression   . Fall 09/16/2015  . Fall at home, initial encounter 02/28/2017  . GERD (gastroesophageal reflux disease)   . Intertrochanteric fracture of left femur ([place]) 09/18/2015  . Malnutrition ([place]) 05/26/2019  . On home oxygen therapy    continuous at this time  . Oxygen deficiency   . S/P ORIF (open reduction internal fixation) fracture left hip 09/18/15 IM nail  05/27/2018  . TIA (transient ischemic attack)    Past Surgical History:  Procedure Laterality Date  . APPENDECTOMY    . BALLOON DILATION N/A 07/24/2013   Procedure: BALLOON DILATION to 13.55mm;  Surgeon: Rogene Houston, MD;  Location: [facility];  Service: Endoscopy;  Laterality: N/A;  . BALLOON DILATION N/A 11/11/2014   Procedure: BALLOON DILATION;  Surgeon: Rogene Houston, MD;  [facility];  Service: Endoscopy;  Laterality: N/A;  . CARDIAC ELECTROPHYSIOLOGY STUDY AND ABLATION    . CATARACT EXTRACTION Bilateral   . CERVICAL CONE BIOPSY    . ESOPHAGEAL DILATION N/A 11/30/2014   Procedure: ESOPHAGEAL DILATION;  Surgeon: Rogene Houston, MD;  Location: [facility];  Service: Endoscopy;  Laterality: N/A;  Balloon, 15, 16.5  . ESOPHAGEAL DILATION N/A 06/03/2015   Procedure: ESOPHAGEAL DILATION WITH 16.5 MM BALLOON ;  Surgeon: Rogene Houston, MD;  Location: [facility];  Service: Endoscopy;  Laterality: N/A;  . ESOPHAGEAL DILATION N/A 09/09/2015  Procedure: ESOPHAGEAL DILATION;  Surgeon: Rogene Houston, MD;  [facility];  Service: Endoscopy;  Laterality: N/A;  . ESOPHAGEAL DILATION N/A 05/04/2016   Procedure: ESOPHAGEAL DILATION;  Surgeon: Rogene Houston, MD;  [facility];  Service: Endoscopy;  Laterality: N/A;  . ESOPHAGEAL DILATION N/A 06/22/2016   Procedure: ESOPHAGEAL DILATION;   Surgeon: Rogene Houston, MD;  [facility];  Service: Endoscopy;  Laterality: N/A;  . ESOPHAGEAL DILATION N/A 05/21/2019   Procedure: ESOPHAGEAL DILATION;  Surgeon: Rogene Houston, MD;  [facility];  Service: Endoscopy;  Laterality: N/A;  . ESOPHAGEAL DILATION N/A 06/19/2019   Procedure: ESOPHAGEAL DILATION;  Surgeon: Rogene Houston, MD;  [facility];  Service: Endoscopy;  Laterality: N/A;  . ESOPHAGOGASTRODUODENOSCOPY N/A 11/11/2014   Procedure: ESOPHAGOGASTRODUODENOSCOPY (EGD);  Surgeon: Rogene Houston, MD;  [facility];  Service: Endoscopy;  Laterality: N/A;  125-rescheduled 11/11/14 @ 10:30am Ann notified pt  . ESOPHAGOGASTRODUODENOSCOPY (EGD) WITH ESOPHAGEAL DILATION N/A 08/20/2013   Procedure: ESOPHAGOGASTRODUODENOSCOPY (EGD) WITH ESOPHAGEAL DILATION;  Surgeon: Rogene Houston, MD;  [facility];  Service: Endoscopy;  Laterality: N/A;  345-moved to 730 Ann notified pt  . ESOPHAGOGASTRODUODENOSCOPY (EGD) WITH PROPOFOL N/A 07/24/2013   Procedure: ESOPHAGOGASTRODUODENOSCOPY (EGD) WITH PROPOFOL UNDER FLUOROSCOPY;  Surgeon: Rogene Houston, MD;  [facility]: [facility];  Service: Endoscopy;  Laterality: N/A;  . ESOPHAGOGASTRODUODENOSCOPY (EGD) WITH PROPOFOL N/A 11/30/2014   Procedure: ESOPHAGOGASTRODUODENOSCOPY (EGD) WITH PROPOFOL;  Surgeon: Rogene Houston, MD;  [facility]: [facility];  Service: Endoscopy;  Laterality: N/A;  fluoro not needed  . ESOPHAGOGASTRODUODENOSCOPY (EGD) WITH PROPOFOL N/A 06/03/2015   Procedure: ESOPHAGOGASTRODUODENOSCOPY (EGD) WITH PROPOFOL; HIATUS AT 35 CM AND GE JUNCTION 40 CM;  Surgeon: Rogene Houston, MD;  [facility]: [facility];  Service: Endoscopy;  Laterality: N/A;  . ESOPHAGOGASTRODUODENOSCOPY (EGD) WITH PROPOFOL N/A 09/09/2015   Procedure: ESOPHAGOGASTRODUODENOSCOPY (EGD) WITH PROPOFOL;  Surgeon: Rogene Houston, MD;  [facility];  Service: Endoscopy;  Laterality: N/A;  8:15  . ESOPHAGOGASTRODUODENOSCOPY (EGD)  WITH PROPOFOL N/A 05/04/2016   Procedure: ESOPHAGOGASTRODUODENOSCOPY (EGD) WITH PROPOFOL;  Surgeon: Rogene Houston, MD;  [facility];  Service: Endoscopy;  Laterality: N/A;  9:30  . ESOPHAGOGASTRODUODENOSCOPY (EGD) WITH PROPOFOL N/A 06/22/2016   Procedure: ESOPHAGOGASTRODUODENOSCOPY (EGD) WITH PROPOFOL;  Surgeon: Rogene Houston, MD;  [facility];  Service: Endoscopy;  Laterality: N/A;  11:20  . ESOPHAGOGASTRODUODENOSCOPY (EGD) WITH PROPOFOL N/A 05/21/2019   Procedure: ESOPHAGOGASTRODUODENOSCOPY (EGD) WITH PROPOFOL;  Surgeon: Rogene Houston, MD;  [facility];  Service: Endoscopy;  Laterality: N/A;  2:30pm  . ESOPHAGOGASTRODUODENOSCOPY (EGD) WITH PROPOFOL N/A 06/19/2019   Procedure: ESOPHAGOGASTRODUODENOSCOPY (EGD) WITH PROPOFOL;  Surgeon: Rogene Houston, MD;  [facility];  Service: Endoscopy;  Laterality: N/A;  220pm  . INTRAMEDULLARY (IM) NAIL INTERTROCHANTERIC Left 09/18/2015   Procedure: INTRAMEDULLARY (IM) NAIL INTERTROCHANTRIC;  Surgeon: Carole Civil, MD;  Location: [facility];  Service: Orthopedics;  Laterality: Left;  . TUBAL LIGATION       Current Meds  Medication Sig  . albuterol (VENTOLIN HFA) 108 (90 Base) MCG/ACT inhaler Inhale 2 puffs into the lungs every 6 (six) hours as needed for wheezing or shortness of breath.  . allopurinol (ZYLOPRIM) 100 MG tablet Take 1 tablet (100 mg total) by mouth daily.  Marland Kitchen BREO ELLIPTA 200-25 MCG/INH AEPB Inhale 1 puff into the lungs 2 (two) times daily.   . Docusate Calcium (  STOOL SOFTENER PO) Take 1 tablet by mouth daily.  Marland Kitchen LORazepam (ATIVAN) 0.5 MG tablet TAKE 1 TABLET BY MOUTH TWICE DAILY AS NEEDED FOR ANXIETY  . mirtazapine (REMERON) 45 MG tablet Take 1 tablet (45 mg total) by mouth at bedtime.  . Multiple Vitamin (MULTIVITAMIN PO) Take 1 tablet by mouth daily.  Marland Kitchen nystatin (MYCOSTATIN) 100000 UNIT/ML suspension Take 5 mLs (500,000 Units total) by mouth 3 (three) times daily as needed.  . OXYGEN  Inhale 2 L into the lungs continuous.   . pantoprazole (PROTONIX) 40 MG tablet TAKE 1 TABLET (40 MG TOTAL) BY MOUTH 2 (TWO) TIMES DAILY BEFORE A MEAL  . promethazine (PHENERGAN) 12.5 MG tablet Take 1 tablet (12.5 mg total) by mouth every 8 (eight) hours as needed for nausea or vomiting.  . vitamin C (ASCORBIC ACID) 250 MG tablet Take 500 mg by mouth daily.      Allergies:   Other and Oxycodone   ROS:   Please see the history of present illness.    All other systems reviewed and are negative.   Labs/Other Tests and Data Reviewed:    Recent Labs: No results found for requested labs within last 8760 hours.   Recent Lipid Panel No results found for: CHOL, TRIG, HDL, CHOLHDL, LDLCALC, LDLDIRECT  Wt Readings from Last 3 Encounters:  03/10/20 105 lb (47.6 kg)  02/25/20 105 lb 12.8 oz (48 kg)  12/02/19 103 lb (46.7 kg)     Objective:    Vital Signs:  There were no vitals taken for this visit.   VITAL SIGNS:  reviewed GEN:  no acute distress RESPIRATORY:  no shortness of breath in conversatoin  PSYCH:  normal affect  ASSESSMENT & PLAN:    1. Severe protein-calorie malnutrition ([place])   2. Anxiety    Time:   Today, I have spent 5 minutes with the patient with telehealth technology discussing the above problems.     Medication Adjustments/Labs and Tests Ordered: Current medicines are reviewed at length with the patient today.  Concerns regarding medicines are outlined above.   Tests Ordered: No orders of the defined types were placed in this encounter.   Medication Changes: No orders of the defined types were placed in this encounter.    Note: This dictation was prepared with Dragon dictation along with smaller phrase technology. Similar sounding words can be transcribed inadequately or may not be corrected upon review. Any transcriptional errors that result from this process are unintentional.      Disposition:  Follow up Visit date not found Signed, Perlie Mayo, NP  07/13/2020 2:23 PM     [facility]

## 2020-07-14 NOTE — Assessment & Plan Note (Signed)
She continues to be on low-dose Ativan I do prefer not to use this 1 especially in geriatric patients.  We had tried stopping it and tried other ones without much success.  So we will continue this for now.

## 2020-07-14 NOTE — Assessment & Plan Note (Signed)
Needs in office appt for follow up on this.  Encouraged boost and ensure as tolerated. Continue remeron.  Partial failure to thrive at times might be causing this to get intermittently worse at times.

## 2020-07-19 ENCOUNTER — Encounter: Payer: Self-pay | Admitting: Family Medicine

## 2020-08-02 NOTE — Telephone Encounter (Signed)
I have not seen these papers in my current stack. Please look in to this. I will send in refill if time.  Thank you

## 2020-08-03 ENCOUNTER — Other Ambulatory Visit: Payer: Self-pay | Admitting: Family Medicine

## 2020-08-04 ENCOUNTER — Other Ambulatory Visit: Payer: Self-pay | Admitting: Family Medicine

## 2020-08-04 DIAGNOSIS — G4701 Insomnia due to medical condition: Secondary | ICD-10-CM

## 2020-08-04 MED ORDER — LORAZEPAM 0.5 MG PO TABS: 0.5000 mg | ORAL_TABLET | Freq: Two times a day (BID) | ORAL | 0 refills | Status: DC | PRN

## 2020-08-08 ENCOUNTER — Telehealth: Payer: Self-pay | Admitting: Family Medicine

## 2020-08-08 NOTE — Telephone Encounter (Signed)
Copied Noted Sleeved

## 2020-08-11 ENCOUNTER — Other Ambulatory Visit: Payer: Self-pay | Admitting: Family Medicine

## 2020-08-11 NOTE — Telephone Encounter (Signed)
Please call and let her know those forms are not the once I fill out or sign.

## 2020-08-15 ENCOUNTER — Telehealth: Payer: Self-pay | Admitting: Family Medicine

## 2020-08-15 NOTE — Telephone Encounter (Signed)
FMLA forms received via fax Copied Noted Sleeved  

## 2020-08-18 ENCOUNTER — Other Ambulatory Visit: Payer: Self-pay

## 2020-08-18 MED ORDER — METOPROLOL TARTRATE 25 MG PO TABS: 25.0000 mg | ORAL_TABLET | Freq: Two times a day (BID) | ORAL | 1 refills | Status: DC

## 2020-08-26 DIAGNOSIS — Z789 Other specified health status: Secondary | ICD-10-CM

## 2020-08-31 ENCOUNTER — Other Ambulatory Visit: Payer: Self-pay

## 2020-08-31 ENCOUNTER — Encounter: Payer: Self-pay | Admitting: Family Medicine

## 2020-08-31 ENCOUNTER — Ambulatory Visit (INDEPENDENT_AMBULATORY_CARE_PROVIDER_SITE_OTHER): Payer: Medicare HMO | Admitting: Family Medicine

## 2020-08-31 VITALS — BP 152/70 | HR 82 | Resp 16 | Ht 68.0 in | Wt 110.4 lb

## 2020-08-31 DIAGNOSIS — M7989 Other specified soft tissue disorders: Secondary | ICD-10-CM | POA: Diagnosis not present

## 2020-08-31 DIAGNOSIS — I10 Essential (primary) hypertension: Secondary | ICD-10-CM | POA: Diagnosis not present

## 2020-08-31 DIAGNOSIS — J439 Emphysema, unspecified: Secondary | ICD-10-CM | POA: Diagnosis not present

## 2020-08-31 DIAGNOSIS — E440 Moderate protein-calorie malnutrition: Secondary | ICD-10-CM | POA: Diagnosis not present

## 2020-08-31 DIAGNOSIS — R7981 Abnormal blood-gas level: Secondary | ICD-10-CM

## 2020-08-31 DIAGNOSIS — G4701 Insomnia due to medical condition: Secondary | ICD-10-CM

## 2020-08-31 MED ORDER — LORAZEPAM 0.5 MG PO TABS: 0.5000 mg | ORAL_TABLET | Freq: Two times a day (BID) | ORAL | 0 refills | Status: DC | PRN

## 2020-08-31 MED ORDER — BREO ELLIPTA 200-25 MCG/INH IN AEPB: 1.0000 | INHALATION_SPRAY | Freq: Two times a day (BID) | RESPIRATORY_TRACT | 3 refills | Status: DC

## 2020-08-31 MED ORDER — HYDROCHLOROTHIAZIDE 12.5 MG PO TABS: 12.5000 mg | ORAL_TABLET | ORAL | 1 refills | Status: AC

## 2020-08-31 MED ORDER — ALBUTEROL SULFATE HFA 108 (90 BASE) MCG/ACT IN AERS: 2.0000 | INHALATION_SPRAY | Freq: Four times a day (QID) | RESPIRATORY_TRACT | 3 refills | Status: AC | PRN

## 2020-08-31 NOTE — Progress Notes (Signed)
Subjective:  Patient ID: Taylor Cannon, female    DOB: 08-26-1934  Age: 86 y.o. MRN: [id number]  CC:  Chief Complaint  Patient presents with  . Foot Swelling    Still having swelling in feet and ankles x 6 weeks. Started wearing the compression hose for the past 2 weeks but not helping   . Shortness of Breath    Gets sob when up moving around. Only uses the oxygen at night. Doesn't use the inhalers       HPI  HPI Ms Taylor Cannon is a 86 year old female patient of mine that presents for an acute visit due to return of leg swelling. We saw her for this in July of 2021 this year and she started compression and it had improved greatly per her and daughter (who also presents with her today). She reports limited mobility, restarted wearing compression- but thinks they are limited in helping due to it swells above the sock line, and not keeping legs elevated as she should. She does not have sores or wounds on her feet or ankles.   Daughter also reports after it is noted her O2 level is low on coming into the office, that she is not using her inhalers or oxygen that she has at home. She will only use it at night. She does get short of breath when moving around. She does not have a strong or heavy cough, nor does she report associated chest pain with this shortness of breath. No recent contacts that have been sick either.   Today patient denies signs and symptoms of COVID 19 infection including fever, chills, cough, shortness of breath, and headache. Past Medical, Surgical, Social History, Allergies, and Medications have been Reviewed.   Past Medical History:  Diagnosis Date  . Anxiety   . Benign paroxysmal positional vertigo 10/05/2014  . Closed fracture of intertrochanteric section of femur ([place])   . Closed left hip fracture ([place]) 09/16/2015  . COPD (chronic obstructive pulmonary disease) ([place])   . Depression   . Fall 09/16/2015  . Fall at home, initial encounter 02/28/2017  . GERD  (gastroesophageal reflux disease)   . Intertrochanteric fracture of left femur ([place]) 09/18/2015  . Malnutrition ([place]) 05/26/2019  . On home oxygen therapy    continuous at this time  . Oxygen deficiency   . S/P ORIF (open reduction internal fixation) fracture left hip 09/18/15 IM nail  05/27/2018  . TIA (transient ischemic attack)     Current Meds  Medication Sig  . allopurinol (ZYLOPRIM) 100 MG tablet Take 1 tablet (100 mg total) by mouth daily.  Mariane Baumgarten Calcium (STOOL SOFTENER PO) Take 1 tablet by mouth daily.  . hydrochlorothiazide (HYDRODIURIL) 12.5 MG tablet Take 1 tablet (12.5 mg total) by mouth every Monday, Wednesday, and Friday.  . metoprolol tartrate (LOPRESSOR) 25 MG tablet Take 1 tablet (25 mg total) by mouth 2 (two) times daily.  . mirtazapine (REMERON) 45 MG tablet Take 1 tablet (45 mg total) by mouth at bedtime.  . Multiple Vitamin (MULTIVITAMIN PO) Take 1 tablet by mouth daily.  . OXYGEN Inhale 2 L into the lungs continuous.   . pantoprazole (PROTONIX) 40 MG tablet TAKE 1 TABLET (40 MG TOTAL) BY MOUTH 2 (TWO) TIMES DAILY BEFORE A MEAL  . promethazine (PHENERGAN) 12.5 MG tablet Take 1 tablet (12.5 mg total) by mouth every 8 (eight) hours as needed for nausea or vomiting.  . vitamin C (ASCORBIC ACID) 250 MG tablet Take  500 mg by mouth daily.   . [DISCONTINUED] LORazepam (ATIVAN) 0.5 MG tablet Take 1 tablet (0.5 mg total) by mouth 2 (two) times daily as needed. for anxiety    ROS:  Review of Systems  HENT: Negative.   Eyes: Negative.   Respiratory: Positive for shortness of breath.   Cardiovascular: Positive for leg swelling.  Gastrointestinal: Negative.   Genitourinary: Negative.   Musculoskeletal: Negative.   Skin: Negative.   Neurological: Negative.   Endo/Heme/Allergies: Negative.   Psychiatric/Behavioral: Negative.      Objective:   Today's Vitals: BP (!) 152/70   Pulse 82   Resp 16   Ht 5\' 8"  (1.727 m)   Wt 110 lb 6.4 oz (50.1 kg)   SpO2 (!) 89%    BMI 16.79 kg/m  Vitals with BMI 08/31/2020 03/10/2020 02/25/2020  Height 5\' 8"  5\' 8"  5\' 8"   Weight 110 lbs 6 oz 105 lbs 105 lbs 13 oz  BMI 16.79 15.97 16.09  Systolic 152 126 02/27/2020  Diastolic 70 78 78  Pulse 82 - 90     Physical Exam Vitals and nursing note reviewed.  Constitutional:      Appearance: Normal appearance. She is well-developed, well-groomed and underweight.  HENT:     Head: Normocephalic and atraumatic.     Right Ear: External ear normal.     Left Ear: External ear normal.     Mouth/Throat:     Comments: Mask in place  Eyes:     General:        Right eye: No discharge.        Left eye: No discharge.     Conjunctiva/sclera: Conjunctivae normal.  Cardiovascular:     Rate and Rhythm: Normal rate and regular rhythm.     Pulses: Normal pulses.     Heart sounds: Normal heart sounds.  Pulmonary:     Effort: Pulmonary effort is normal.     Breath sounds: Normal breath sounds.  Musculoskeletal:        General: Normal range of motion.     Cervical back: Normal range of motion and neck supple.     Right lower leg: 1+ Edema present.     Left lower leg: 1+ Edema present.  Skin:    General: Skin is warm.  Neurological:     General: No focal deficit present.     Mental Status: She is alert and oriented to person, place, and time.  Psychiatric:        Attention and Perception: Attention normal.        Mood and Affect: Mood normal.        Speech: Speech normal.        Behavior: Behavior normal. Behavior is cooperative.        Thought Content: Thought content normal.        Cognition and Memory: Cognition normal.        Judgment: Judgment normal.      Assessment   1. Moderate protein-calorie malnutrition ([facility])   2. Pulmonary emphysema, unspecified emphysema type (H[facility])   3. Leg swelling   4. Essential hypertension   5. Low oxygen saturation   6. Insomnia due to medical condition     Tests ordered Orders Placed This Encounter  Procedures  . CBC  .  Comprehensive metabolic panel     Plan: Please see assessment and plan per problem list above.   Meds ordered this encounter  Medications  . hydrochlorothiazide (HYDRODIURIL) 12.5 MG tablet  Sig: Take 1 tablet (12.5 mg total) by mouth every Monday, Wednesday, and Friday.    Dispense:  30 tablet    Refill:  1    Order Specific Question:   Supervising Provider    Answer:   SIMPSON, MARGARET E [[id number]]  . albuterol (VENTOLIN HFA) 108 (90 Base) MCG/ACT inhaler    Sig: Inhale 2 puffs into the lungs every 6 (six) hours as needed for wheezing or shortness of breath.    Dispense:  6.7 g    Refill:  3    Order Specific Question:   Supervising Provider    Answer:   SIMPSON, MARGARET E [[id number]]  . BREO ELLIPTA 200-25 MCG/INH AEPB    Sig: Inhale 1 puff into the lungs 2 (two) times daily.    Dispense:  28 each    Refill:  3    Order Specific Question:   Supervising Provider    Answer:   SIMPSON, MARGARET E [[id number]]  . LORazepam (ATIVAN) 0.5 MG tablet    Sig: Take 1 tablet (0.5 mg total) by mouth 2 (two) times daily as needed. for anxiety    Dispense:  30 tablet    Refill:  0    Order Specific Question:   Supervising Provider    Answer:   Kerri Perches [[id number]]    Patient to follow-up in 4 weeks    Freddy Finner, NP

## 2020-08-31 NOTE — Patient Instructions (Signed)
I appreciate the opportunity to provide you with care for your health and wellness. Today we discussed: several concerns   Follow up: 4 weeks in office   Labs- today  No referrals today  Please wait until you hear from [place] about labs before starting the new fluid pill  Wear oxygen continuously when up moving.  Use inhalers daily as ordered  Increase protein in diet.  Wear compression, keep legs elevated when sitting, and walk hourly.  Please continue to practice social distancing to keep you, your family, and our community safe.  If you must go out, please wear a mask and practice good handwashing.  It was a pleasure to see you and I look forward to continuing to work together on your health and well-being. Please do not hesitate to call the office if you need care or have questions about your care.  Have a wonderful day. With Gratitude, Tereasa Coop, DNP, AGNP-BC

## 2020-08-31 NOTE — Assessment & Plan Note (Signed)
Taylor Cannon is encouraged to maintain a well balanced diet that is low in salt. Higher end of controlled, addition of every other day dose of HCTZ will be started. Labs ordered and close follow up   Additionally, she is also reminded that exercise is beneficial for heart health and control of  Blood pressure. 30-60 minutes daily is recommended-walking was suggested.

## 2020-08-31 NOTE — Assessment & Plan Note (Signed)
Improve, but encouraged to increase protein intake as well.

## 2020-08-31 NOTE — Assessment & Plan Note (Signed)
Returned recently. Per daughter she is not moving as much as she was. Not wearing compression as then the swelling just occurred about the line. Will get labs and see if low dose HCTZ will be of benefit.  I have encouraged continued use of compression socks, elevation of legs, walking hourly as well.  Clod f/u for repeat labs and BP check

## 2020-08-31 NOTE — Assessment & Plan Note (Signed)
Did not bring oxygen with her. Does not wear it at home much. O2 dropped in office while walking She is encouraged to wear it as ordered

## 2020-08-31 NOTE — Assessment & Plan Note (Signed)
Refills provided  Reviewed side effects, risks and benefits of medication.  ' Patient acknowledged agreement and understanding of the plan.

## 2020-08-31 NOTE — Assessment & Plan Note (Signed)
Needs refills on inhalers is bad about using them per daughter. Does not wear her oxygen as ordered either.  She has shortness of breath in conversation when walking and sats dropped to low 80's on RA while ambulating. She was placed on 2 L via [place] and was able to come up to 94%. Encouraged to use inhalers and oxygen (not while smoking)

## 2020-09-01 ENCOUNTER — Encounter: Payer: Self-pay | Admitting: Family Medicine

## 2020-09-01 LAB — COMPREHENSIVE METABOLIC PANEL
ALT: 8 IU/L (ref 0–32)
AST: 34 IU/L (ref 0–40)
Albumin/Globulin Ratio: 1.5 (ref 1.2–2.2)
Albumin: 3.5 g/dL — ABNORMAL LOW (ref 3.6–4.6)
Alkaline Phosphatase: 72 IU/L (ref 44–121)
BUN/Creatinine Ratio: 6 — ABNORMAL LOW (ref 12–28)
BUN: 6 mg/dL — ABNORMAL LOW (ref 8–27)
Bilirubin Total: 0.3 mg/dL (ref 0.0–1.2)
CO2: 30 mmol/L — ABNORMAL HIGH (ref 20–29)
Calcium: 8.5 mg/dL — ABNORMAL LOW (ref 8.7–10.3)
Chloride: 100 mmol/L (ref 96–106)
Creatinine, Ser: 0.94 mg/dL (ref 0.57–1.00)
GFR calc Af Amer: 64 mL/min/{1.73_m2} (ref >59)
GFR calc non Af Amer: 56 mL/min/{1.73_m2} — ABNORMAL LOW (ref >59)
Globulin, Total: 2.4 g/dL (ref 1.5–4.5)
Glucose: 88 mg/dL (ref 65–99)
Potassium: 4.9 mmol/L (ref 3.5–5.2)
Sodium: 140 mmol/L (ref 134–144)
Total Protein: 5.9 g/dL — ABNORMAL LOW (ref 6.0–8.5)

## 2020-09-01 LAB — CBC
Hematocrit: 33.2 % — ABNORMAL LOW (ref 34.0–46.6)
Hemoglobin: 10.3 g/dL — ABNORMAL LOW (ref 11.1–15.9)
MCH: 29.7 pg (ref 26.6–33.0)
MCHC: 31 g/dL — ABNORMAL LOW (ref 31.5–35.7)
MCV: 96 fL (ref 79–97)
Platelets: 313 10*3/uL (ref 150–450)
RBC: 3.47 x10E6/uL — ABNORMAL LOW (ref 3.77–5.28)
RDW: 17.1 % — ABNORMAL HIGH (ref 11.7–15.4)
WBC: 6.6 10*3/uL (ref 3.4–10.8)

## 2020-09-01 NOTE — Telephone Encounter (Signed)
Karolee Stamps pt daughter notified of lab results with verbal understanding

## 2020-09-08 ENCOUNTER — Encounter: Payer: Self-pay | Admitting: Family Medicine

## 2020-09-08 ENCOUNTER — Telehealth (INDEPENDENT_AMBULATORY_CARE_PROVIDER_SITE_OTHER): Payer: Medicare HMO | Admitting: Family Medicine

## 2020-09-08 ENCOUNTER — Other Ambulatory Visit: Payer: Self-pay

## 2020-09-08 DIAGNOSIS — I519 Heart disease, unspecified: Secondary | ICD-10-CM | POA: Insufficient documentation

## 2020-09-08 DIAGNOSIS — I10 Essential (primary) hypertension: Secondary | ICD-10-CM

## 2020-09-08 DIAGNOSIS — R7981 Abnormal blood-gas level: Secondary | ICD-10-CM | POA: Diagnosis not present

## 2020-09-08 NOTE — Assessment & Plan Note (Signed)
Demonstrated impaired relaxation on ECHO from 2020. Good to be on BB, however HR dropping is concerning- could be oxygen related. Will continue to monitor But reducing Lopressor to 12.5 mg BID w close follow up

## 2020-09-08 NOTE — Assessment & Plan Note (Signed)
Poor adherence to oxygen use while moving around. I have spent time extensively encouraging oxygen wearing while moving around. Use of inhalers prior to moving and as ordered. Daughter and pt report understanding. Follow up next week.

## 2020-09-08 NOTE — Telephone Encounter (Signed)
Pt made a virtual visit with Dahlia Client 09-09-19 at 1:20

## 2020-09-08 NOTE — Assessment & Plan Note (Signed)
She is tolerating the HCTZ as ordered 3 times weekly. BP is good at 130 SPB Continue medication for now- would like to look at dose reduction of lopressor if HR continues to drop.   DASH diet, exercise, smoking cessation recommended.

## 2020-09-08 NOTE — Progress Notes (Signed)
Virtual Visit via Telephone Note   This visit type was conducted due to national recommendations for restrictions regarding the COVID-19 Pandemic (e.g. social distancing) in an effort to limit this patient's exposure and mitigate transmission in our community.  Due to her co-morbid illnesses, this patient is at least at moderate risk for complications without adequate follow up.  This format is felt to be most appropriate for this patient at this time.  The patient did not have access to video technology/had technical difficulties with video requiring transitioning to audio format only (telephone).  All issues noted in this document were discussed and addressed.  No physical exam could be performed with this format.   Evaluation Performed:  Follow-up visit  Date:  09/08/2020   ID:  Taylor Cannon, DOB Jun 19, 1934, MRN [id number]  Patient Location: Home Provider Location: Office/Clinic   Participants: Nurse for intake and work up; Patient and Provider for Visit and Wrap up  Method of visit: Telephone   Location of Patient: Home Location of Provider: Office Consent was obtain for visit over the telephone. Services rendered by provider: Visit was performed via telephone  I verified that I am speaking with the correct person using two identifiers.  PCP:  Freddy Finner, NP   Chief Complaint:  Oxygen and heart rate dropping  History of Present Illness:    Taylor Cannon is a 87 y.o. female with history as stated below was just seen in the office back on 08/31/20 a week back. Daughter calls in and reports increase coughing, oxygen and heart rate changes. Mostly associated with walking. I had strongly advise she use her home o2 and inhalers as ordered at the in office visit last week.  Daughter reports she is not walking much, but her oxygen drops and her heart rate was dropping as well. She reports a HR of 40's and O2 of 80's. But also reports she was not wearing her oxygen as  encouraged to do. And has not been using inhalers. Review of this was provided in detail over the phone.  She denies chest pain, shortness of breath (outside her baseline), exposure to COVID.  Leg swelling is improved greatly with the use of HCTZ  3 days a week. She does have lopressore 25 mg BID as well.  Of note while on visit today her oxygen at rest on RA was 95% and HR back up to 80's.   The patient does not have symptoms concerning for COVID-19 infection (fever, chills, cough, or new shortness of breath).   Past Medical, Surgical, Social History, Allergies, and Medications have been Reviewed.  Past Medical History:  Diagnosis Date  . Anxiety   . Benign paroxysmal positional vertigo 10/05/2014  . Closed fracture of intertrochanteric section of femur (HCC)   . Closed left hip fracture (HCC) 09/16/2015  . COPD (chronic obstructive pulmonary disease) (HCC)   . Depression   . Fall 09/16/2015  . Fall at home, initial encounter 02/28/2017  . GERD (gastroesophageal reflux disease)   . Intertrochanteric fracture of left femur (HCC) 09/18/2015  . Malnutrition (HCC) 05/26/2019  . On home oxygen therapy    continuous at this time  . Oxygen deficiency   . S/P ORIF (open reduction internal fixation) fracture left hip 09/18/15 IM nail  05/27/2018  . TIA (transient ischemic attack)    Past Surgical History:  Procedure Laterality Date  . APPENDECTOMY    . BALLOON DILATION N/A 07/24/2013   Procedure: BALLOON DILATION to  13.11mm;  Surgeon: Rogene Houston, MD;  [facility];  Service: Endoscopy;  Laterality: N/A;  . BALLOON DILATION N/A 11/11/2014   Procedure: BALLOON DILATION;  Surgeon: Rogene Houston, MD;  [facility];  Service: Endoscopy;  Laterality: N/A;  . CARDIAC ELECTROPHYSIOLOGY STUDY AND ABLATION    . CATARACT EXTRACTION Bilateral   . CERVICAL CONE BIOPSY    . ESOPHAGEAL DILATION N/A 11/30/2014   Procedure: ESOPHAGEAL DILATION;  Surgeon: Rogene Houston, MD;  [facility];  Service: Endoscopy;  Laterality: N/A;  Balloon, 15, 16.5  . ESOPHAGEAL DILATION N/A 06/03/2015   Procedure: ESOPHAGEAL DILATION WITH 16.5 MM BALLOON ;  Surgeon: Rogene Houston, MD;  [facility];  Service: Endoscopy;  Laterality: N/A;  . ESOPHAGEAL DILATION N/A 09/09/2015   Procedure: ESOPHAGEAL DILATION;  Surgeon: Rogene Houston, MD;  [facility];  Service: Endoscopy;  Laterality: N/A;  . ESOPHAGEAL DILATION N/A 05/04/2016   Procedure: ESOPHAGEAL DILATION;  Surgeon: Rogene Houston, MD;  [facility];  Service: Endoscopy;  Laterality: N/A;  . ESOPHAGEAL DILATION N/A 06/22/2016   Procedure: ESOPHAGEAL DILATION;  Surgeon: Rogene Houston, MD;  [facility];  Service: Endoscopy;  Laterality: N/A;  . ESOPHAGEAL DILATION N/A 05/21/2019   Procedure: ESOPHAGEAL DILATION;  Surgeon: Rogene Houston, MD;  [facility];  Service: Endoscopy;  Laterality: N/A;  . ESOPHAGEAL DILATION N/A 06/19/2019   Procedure: ESOPHAGEAL DILATION;  Surgeon: Rogene Houston, MD;  [facility];  Service: Endoscopy;  Laterality: N/A;  . ESOPHAGOGASTRODUODENOSCOPY N/A 11/11/2014   Procedure: ESOPHAGOGASTRODUODENOSCOPY (EGD);  Surgeon: Rogene Houston, MD;  [facility];  Service: Endoscopy;  Laterality: N/A;  125-rescheduled 11/11/14 @ 10:30am Ann notified pt  . ESOPHAGOGASTRODUODENOSCOPY (EGD) WITH ESOPHAGEAL DILATION N/A 08/20/2013   Procedure: ESOPHAGOGASTRODUODENOSCOPY (EGD) WITH ESOPHAGEAL DILATION;  Surgeon: Rogene Houston, MD;  [facility];  Service: Endoscopy;  Laterality: N/A;  345-moved to 730 Ann notified pt  . ESOPHAGOGASTRODUODENOSCOPY (EGD) WITH PROPOFOL N/A 07/24/2013   Procedure: ESOPHAGOGASTRODUODENOSCOPY (EGD) WITH PROPOFOL UNDER FLUOROSCOPY;  Surgeon: Rogene Houston, MD;  [facility];  Service: Endoscopy;  Laterality: N/A;  . ESOPHAGOGASTRODUODENOSCOPY (EGD) WITH PROPOFOL N/A 11/30/2014   Procedure:  ESOPHAGOGASTRODUODENOSCOPY (EGD) WITH PROPOFOL;  Surgeon: Rogene Houston, MD;  [facility];  Service: Endoscopy;  Laterality: N/A;  fluoro not needed  . ESOPHAGOGASTRODUODENOSCOPY (EGD) WITH PROPOFOL N/A 06/03/2015   Procedure: ESOPHAGOGASTRODUODENOSCOPY (EGD) WITH PROPOFOL; HIATUS AT 35 CM AND GE JUNCTION 40 CM;  Surgeon: Rogene Houston, MD;  [facility];  Service: Endoscopy;  Laterality: N/A;  . ESOPHAGOGASTRODUODENOSCOPY (EGD) WITH PROPOFOL N/A 09/09/2015   Procedure: ESOPHAGOGASTRODUODENOSCOPY (EGD) WITH PROPOFOL;  Surgeon: Rogene Houston, MD;  [facility];  Service: Endoscopy;  Laterality: N/A;  8:15  . ESOPHAGOGASTRODUODENOSCOPY (EGD) WITH PROPOFOL N/A 05/04/2016   Procedure: ESOPHAGOGASTRODUODENOSCOPY (EGD) WITH PROPOFOL;  Surgeon: Rogene Houston, MD;  [facility];  Service: Endoscopy;  Laterality: N/A;  9:30  . ESOPHAGOGASTRODUODENOSCOPY (EGD) WITH PROPOFOL N/A 06/22/2016   Procedure: ESOPHAGOGASTRODUODENOSCOPY (EGD) WITH PROPOFOL;  Surgeon: Rogene Houston, MD;  [facility];  Service: Endoscopy;  Laterality: N/A;  11:20  . ESOPHAGOGASTRODUODENOSCOPY (EGD) WITH PROPOFOL N/A 05/21/2019   Procedure: ESOPHAGOGASTRODUODENOSCOPY (EGD) WITH PROPOFOL;  Surgeon: Rogene Houston, MD;  [facility];  Service: Endoscopy;  Laterality: N/A;  2:30pm  . ESOPHAGOGASTRODUODENOSCOPY (EGD) WITH PROPOFOL N/A 06/19/2019  Procedure: ESOPHAGOGASTRODUODENOSCOPY (EGD) WITH PROPOFOL;  Surgeon: Rogene Houston, MD;  [facility];  Service: Endoscopy;  Laterality: N/A;  220pm  . INTRAMEDULLARY (IM) NAIL INTERTROCHANTERIC Left 09/18/2015   Procedure: INTRAMEDULLARY (IM) NAIL INTERTROCHANTRIC;  Surgeon: Carole Civil, MD;  [facility]: [facility];  Service: Orthopedics;  Laterality: Left;  . TUBAL LIGATION       Current Meds  Medication Sig  . albuterol (VENTOLIN HFA) 108 (90 Base) MCG/ACT inhaler Inhale 2 puffs into the lungs every 6 (six) hours as  needed for wheezing or shortness of breath.  . allopurinol (ZYLOPRIM) 100 MG tablet Take 1 tablet (100 mg total) by mouth daily.  Marland Kitchen BREO ELLIPTA 200-25 MCG/INH AEPB Inhale 1 puff into the lungs 2 (two) times daily.  Taylor Cannon Calcium (STOOL SOFTENER PO) Take 1 tablet by mouth daily.  . hydrochlorothiazide (HYDRODIURIL) 12.5 MG tablet Take 1 tablet (12.5 mg total) by mouth every Monday, Wednesday, and Friday.  Marland Kitchen LORazepam (ATIVAN) 0.5 MG tablet Take 1 tablet (0.5 mg total) by mouth 2 (two) times daily as needed. for anxiety  . metoprolol tartrate (LOPRESSOR) 25 MG tablet Take 1 tablet (25 mg total) by mouth 2 (two) times daily.  . mirtazapine (REMERON) 45 MG tablet Take 1 tablet (45 mg total) by mouth at bedtime.  . Multiple Vitamin (MULTIVITAMIN PO) Take 1 tablet by mouth daily.  . OXYGEN Inhale 2 L into the lungs continuous.   . pantoprazole (PROTONIX) 40 MG tablet TAKE 1 TABLET (40 MG TOTAL) BY MOUTH 2 (TWO) TIMES DAILY BEFORE A MEAL  . promethazine (PHENERGAN) 12.5 MG tablet Take 1 tablet (12.5 mg total) by mouth every 8 (eight) hours as needed for nausea or vomiting.  . vitamin C (ASCORBIC ACID) 250 MG tablet Take 500 mg by mouth daily.      Allergies:   Other and Oxycodone   ROS:   Please see the history of present illness.    All other systems reviewed and are negative.   Labs/Other Tests and Data Reviewed:    Recent Labs: 08/31/2020: ALT 8; BUN 6; Creatinine, Ser 0.94; Hemoglobin 10.3; Platelets 313; Potassium 4.9; Sodium 140   Recent Lipid Panel No results found for: CHOL, TRIG, HDL, CHOLHDL, LDLCALC, LDLDIRECT  Wt Readings from Last 3 Encounters:  08/31/20 110 lb 6.4 oz (50.1 kg)  03/10/20 105 lb (47.6 kg)  02/25/20 105 lb 12.8 oz (48 kg)     Objective:    Vital Signs:  There were no vitals taken for this visit.   VITAL SIGNS:  reviewed GEN:  no acute distress RESPIRATORY:  no shortness of breath in conversation.  PSYCH:  normal affect  ASSESSMENT & PLAN:     1. Low oxygen saturation   2. Essential hypertension   3. Impaired left ventricular relaxation    Time:   Today, I have spent 15 minutes with the patient with telehealth technology discussing the above problems, previous test and chart notes.     Medication Adjustments/Labs and Tests Ordered: Current medicines are reviewed at length with the patient today.  Concerns regarding medicines are outlined above.   Tests Ordered: No orders of the defined types were placed in this encounter.   Medication Changes: No orders of the defined types were placed in this encounter.    Disposition:  Follow up 09/28/2020 Signed, Perlie Mayo, NP  09/08/2020 1:09 PM     [facility]

## 2020-09-08 NOTE — Patient Instructions (Signed)
  I appreciate the opportunity to provide you with care for your health and wellness.  Follow up: as scheduled  No labs or referrals today Reduce Lopressor to 12.5 mg twice daily.  Please wear oxygen when walking around. Use inhalers as directed. Use albuterol prior to moving around to help with oxygen use.   Please continue to practice social distancing to keep you, your family, and our community safe.  If you must go out, please wear a mask and practice good handwashing.  It was a pleasure to see you and I look forward to continuing to work together on your health and well-being. Please do not hesitate to call the office if you need care or have questions about your care.  Have a wonderful day. With Gratitude, Tereasa Coop, DNP, AGNP-BC

## 2020-09-09 ENCOUNTER — Other Ambulatory Visit: Payer: Medicare HMO

## 2020-09-12 ENCOUNTER — Telehealth: Payer: Self-pay

## 2020-09-12 NOTE — Telephone Encounter (Signed)
Patient is congested and her daughter would like to know what she can give her to help break it up [phone]

## 2020-09-12 NOTE — Telephone Encounter (Signed)
Daughter aware.

## 2020-09-13 ENCOUNTER — Encounter: Payer: Self-pay | Admitting: Family Medicine

## 2020-09-13 ENCOUNTER — Emergency Department (HOSPITAL_COMMUNITY): Payer: Medicare HMO

## 2020-09-13 ENCOUNTER — Other Ambulatory Visit: Payer: Self-pay

## 2020-09-13 ENCOUNTER — Telehealth (INDEPENDENT_AMBULATORY_CARE_PROVIDER_SITE_OTHER): Payer: Medicare HMO | Admitting: Family Medicine

## 2020-09-13 ENCOUNTER — Encounter (HOSPITAL_COMMUNITY): Payer: Self-pay | Admitting: Emergency Medicine

## 2020-09-13 ENCOUNTER — Emergency Department (HOSPITAL_COMMUNITY)
Admission: EM | Admit: 2020-09-13 | Discharge: 2020-09-13 | Disposition: A | Discharge status: Home / Self Care | Payer: Medicare HMO | Attending: Emergency Medicine | Admitting: Emergency Medicine

## 2020-09-13 VITALS — Ht 68.0 in | Wt 110.0 lb

## 2020-09-13 DIAGNOSIS — K449 Diaphragmatic hernia without obstruction or gangrene: Secondary | ICD-10-CM | POA: Diagnosis not present

## 2020-09-13 DIAGNOSIS — Z79899 Other long term (current) drug therapy: Secondary | ICD-10-CM | POA: Insufficient documentation

## 2020-09-13 DIAGNOSIS — R079 Chest pain, unspecified: Secondary | ICD-10-CM | POA: Diagnosis not present

## 2020-09-13 DIAGNOSIS — I10 Essential (primary) hypertension: Secondary | ICD-10-CM | POA: Diagnosis not present

## 2020-09-13 DIAGNOSIS — J441 Chronic obstructive pulmonary disease with (acute) exacerbation: Secondary | ICD-10-CM | POA: Insufficient documentation

## 2020-09-13 DIAGNOSIS — Z87891 Personal history of nicotine dependence: Secondary | ICD-10-CM | POA: Diagnosis not present

## 2020-09-13 DIAGNOSIS — I313 Pericardial effusion (noninflammatory): Secondary | ICD-10-CM | POA: Diagnosis not present

## 2020-09-13 DIAGNOSIS — R21 Rash and other nonspecific skin eruption: Secondary | ICD-10-CM | POA: Diagnosis not present

## 2020-09-13 DIAGNOSIS — I3139 Other pericardial effusion (noninflammatory): Secondary | ICD-10-CM

## 2020-09-13 DIAGNOSIS — J439 Emphysema, unspecified: Secondary | ICD-10-CM

## 2020-09-13 DIAGNOSIS — R069 Unspecified abnormalities of breathing: Secondary | ICD-10-CM | POA: Diagnosis not present

## 2020-09-13 DIAGNOSIS — R0602 Shortness of breath: Secondary | ICD-10-CM | POA: Diagnosis not present

## 2020-09-13 DIAGNOSIS — I700 Atherosclerosis of aorta: Secondary | ICD-10-CM | POA: Diagnosis not present

## 2020-09-13 DIAGNOSIS — Z20822 Contact with and (suspected) exposure to covid-19: Secondary | ICD-10-CM | POA: Diagnosis not present

## 2020-09-13 DIAGNOSIS — J90 Pleural effusion, not elsewhere classified: Secondary | ICD-10-CM | POA: Diagnosis not present

## 2020-09-13 DIAGNOSIS — I517 Cardiomegaly: Secondary | ICD-10-CM | POA: Diagnosis not present

## 2020-09-13 LAB — RESP PANEL BY RT-PCR (FLU A&B, COVID) ARPGX2
Influenza A by PCR: NEGATIVE
Influenza B by PCR: NEGATIVE
SARS Coronavirus 2 by RT PCR: NEGATIVE

## 2020-09-13 LAB — CBC WITH DIFFERENTIAL/PLATELET
Abs Immature Granulocytes: 0.03 10*3/uL (ref 0.00–0.07)
Basophils Absolute: 0 10*3/uL (ref 0.0–0.1)
Basophils Relative: 0 %
Eosinophils Absolute: 0.1 10*3/uL (ref 0.0–0.5)
Eosinophils Relative: 2 %
HCT: 32.2 % — ABNORMAL LOW (ref 36.0–46.0)
Hemoglobin: 9.8 g/dL — ABNORMAL LOW (ref 12.0–15.0)
Immature Granulocytes: 1 %
Lymphocytes Relative: 7 %
Lymphs Abs: 0.4 10*3/uL — ABNORMAL LOW (ref 0.7–4.0)
MCH: 30.7 pg (ref 26.0–34.0)
MCHC: 30.4 g/dL (ref 30.0–36.0)
MCV: 100.9 fL — ABNORMAL HIGH (ref 80.0–100.0)
Monocytes Absolute: 0.4 10*3/uL (ref 0.1–1.0)
Monocytes Relative: 7 %
Neutro Abs: 4.6 10*3/uL (ref 1.7–7.7)
Neutrophils Relative %: 83 %
Platelets: 265 10*3/uL (ref 150–400)
RBC: 3.19 MIL/uL — ABNORMAL LOW (ref 3.87–5.11)
RDW: 17.5 % — ABNORMAL HIGH (ref 11.5–15.5)
WBC: 5.5 10*3/uL (ref 4.0–10.5)
nRBC: 0 % (ref 0.0–0.2)

## 2020-09-13 LAB — BASIC METABOLIC PANEL
Anion gap: 7 (ref 5–15)
BUN: 21 mg/dL (ref 8–23)
CO2: 30 mmol/L (ref 22–32)
Calcium: 8.2 mg/dL — ABNORMAL LOW (ref 8.9–10.3)
Chloride: 99 mmol/L (ref 98–111)
Creatinine, Ser: 1.06 mg/dL — ABNORMAL HIGH (ref 0.44–1.00)
GFR, Estimated: 52 mL/min — ABNORMAL LOW (ref >60)
Glucose, Bld: 103 mg/dL — ABNORMAL HIGH (ref 70–99)
Potassium: 4.9 mmol/L (ref 3.5–5.1)
Sodium: 136 mmol/L (ref 135–145)

## 2020-09-13 LAB — BRAIN NATRIURETIC PEPTIDE: B Natriuretic Peptide: 146 pg/mL — ABNORMAL HIGH (ref 0.0–100.0)

## 2020-09-13 MED ORDER — METHYLPREDNISOLONE SODIUM SUCC 125 MG IJ SOLR
80.0000 mg | Freq: Once | INTRAMUSCULAR | Status: AC
  Administered 2020-09-13: 80 mg via INTRAVENOUS
  Filled 2020-09-13: qty 2

## 2020-09-13 MED ORDER — AZITHROMYCIN 200 MG/5ML PO SUSR: 200.0000 mg | Freq: Every day | ORAL | 0 refills | Status: DC

## 2020-09-13 MED ORDER — DOXYCYCLINE HYCLATE 100 MG PO TABS
100.0000 mg | ORAL_TABLET | Freq: Once | ORAL | Status: DC
  Filled 2020-09-13: qty 1

## 2020-09-13 MED ORDER — IOHEXOL 350 MG/ML SOLN
75.0000 mL | Freq: Once | INTRAVENOUS | Status: AC | PRN
  Administered 2020-09-13: 75 mL via INTRAVENOUS

## 2020-09-13 MED ORDER — AZITHROMYCIN 200 MG/5ML PO SUSR
500.0000 mg | Freq: Once | ORAL | Status: AC
  Administered 2020-09-13: 500 mg via ORAL
  Filled 2020-09-13: qty 15

## 2020-09-13 MED ORDER — DOXYCYCLINE HYCLATE 100 MG PO CAPS: 100.0000 mg | ORAL_CAPSULE | Freq: Two times a day (BID) | ORAL | 0 refills | Status: DC

## 2020-09-13 MED ORDER — PREDNISOLONE 15 MG/5ML PO SOLN: 60.0000 mg | Freq: Every day | ORAL | 0 refills | Status: AC

## 2020-09-13 MED ORDER — PREDNISONE 50 MG PO TABS
50.0000 mg | ORAL_TABLET | Freq: Once | ORAL | Status: DC
  Filled 2020-09-13: qty 1

## 2020-09-13 MED ORDER — PREDNISONE 50 MG PO TABS: 50.0000 mg | ORAL_TABLET | Freq: Every day | ORAL | 0 refills | Status: DC

## 2020-09-13 NOTE — Discharge Instructions (Addendum)
You were seen in the emergency department for increased shortness of breath with activity. Blood work that showed you are slightly more anemic than usual.  Your CAT scan did not show any evidence of blood clot.  They did see some possible infection in the bases along with small pleural effusions.  You also had a small pericardial effusion.  We are treating you with antibiotics and steroids for a COPD exacerbation.  This will need close follow-up with your primary care doctor.  We are also referring you to cardiology.  Your COVID and flu testing were negative.

## 2020-09-13 NOTE — Assessment & Plan Note (Signed)
Visit is cut short as she is advised to call 911/EMS to get her to the nearest emergency room to assess in person oxygen levels possible chest x-ray and labs.  Additionally would benefit from COVID testing.  See more details in HPI of note.

## 2020-09-13 NOTE — ED Triage Notes (Signed)
Pt from home via [facility]. Per daughter at the home pt has dyspnea with exertion. Pt is on 4L [place] chronically.

## 2020-09-13 NOTE — Progress Notes (Signed)
Virtual Visit via Telephone Note   This visit type was conducted due to national recommendations for restrictions regarding the COVID-19 Pandemic (e.g. social distancing) in an effort to limit this patient's exposure and mitigate transmission in our community.  Due to her co-morbid illnesses, this patient is at least at moderate risk for complications without adequate follow up.  This format is felt to be most appropriate for this patient at this time.  The patient did not have access to video technology/had technical difficulties with video requiring transitioning to audio format only (telephone).  All issues noted in this document were discussed and addressed.  No physical exam could be performed with this format.    Evaluation Performed:  Follow-up visit  Date:  09/13/2020   ID:  Taylor Cannon, DOB 10/26/1933, MRN [id number]  Patient Location: Home Provider Location: Office/Clinic   Participants: Nurse for intake and work up; Patient and Provider for Visit and Wrap up  Method of visit: Telephone  Location of Patient: Home Location of Provider: Office Consent was obtain for visit over the telephone. Services rendered by provider: Visit was performed via telephone  I verified that I am speaking with the correct person using two identifiers.  PCP:  Perlie Mayo, NP   Chief Complaint: Follow-up on low oxygen levels  History of Present Illness:    Taylor Cannon is a 87 y.o. female with history as detailed below.  Daughter provides most history at this time she reports today for follow-up on low oxygen levels after being seen via phone visit on January 6 last week.  She was seen in the office on December 29 as well.  At that time when I saw her in December they were not using oxygen and inhalers as we had recommended.  And during the January 6 phone visit it was strongly advised that she start wearing her home O2 and inhalers as ordered for use to help with her oxygen as it  appeared that she had increased coughing, lower oxygen levels with mobility and heart rate changes secondary to not.  She denies chest pain, shortness of breath (outside her baseline), exposure to COVID.  Today she reports for follow-up on this to make sure things okay.  She reports that walking her on Friday her oxygen level was 82% on 2 L, heart rate of 62.  She reports she has been wearing her oxygen 24 hours a day since we last spoke on January 6.  She reports that when she went to rest after walking her heart rate improved to 86 and her respiratory numbers came up to 96% on 2 L resting.  Had to do an additional walk to go to the restroom.  She was 88% on 2 L but her heart rate was going up to 94. Saturday she had company for 45 mins- and she was on the oxygen 85% 100 HR. then after they left she went for a walk oxygen numbers dropped to 79% on the 2 L . and her heart rate was 102.  During this conversation she is working to get her oxygen levels to be stable with a reading in the 80s to low 90s on 2 L.  At that time she is advised to call 911 to have EMS assessed her and carry her to any [facility] emergency room.  The patient does not have symptoms concerning for COVID-19 infection (fever, chills, cough, or new shortness of breath).   Past Medical, Surgical, Social  History, Allergies, and Medications have been Reviewed.  Past Medical History:  Diagnosis Date  . Anxiety   . Benign paroxysmal positional vertigo 10/05/2014  . Closed fracture of intertrochanteric section of femur ([place])   . Closed left hip fracture ([place]) 09/16/2015  . COPD (chronic obstructive pulmonary disease) ([place])   . Depression   . Dysphagia, pharyngoesophageal phase 10/05/2014  . Esophageal stricture 04/11/2016   Added automatically from request for surgery [id number]  . Fall 09/16/2015  . Fall at home, initial encounter 02/28/2017  . Gastroesophageal reflux disease 05/26/2019  . GERD (gastroesophageal reflux disease)   . Gout  05/26/2019  . Intertrochanteric fracture of left femur ([place]) 09/18/2015  . Malnutrition ([place]) 05/26/2019  . Nausea 05/26/2019  . On home oxygen therapy    continuous at this time  . Oxygen deficiency   . S/P ORIF (open reduction internal fixation) fracture left hip 09/18/15 IM nail  05/27/2018  . TIA (transient ischemic attack)    Past Surgical History:  Procedure Laterality Date  . APPENDECTOMY    . BALLOON DILATION N/A 07/24/2013   Procedure: BALLOON DILATION to 13.79mm;  Surgeon: Rogene Houston, MD;  [facility];  Service: Endoscopy;  Laterality: N/A;  . BALLOON DILATION N/A 11/11/2014   Procedure: BALLOON DILATION;  Surgeon: Rogene Houston, MD;  [facility];  Service: Endoscopy;  Laterality: N/A;  . CARDIAC ELECTROPHYSIOLOGY STUDY AND ABLATION    . CATARACT EXTRACTION Bilateral   . CERVICAL CONE BIOPSY    . ESOPHAGEAL DILATION N/A 11/30/2014   Procedure: ESOPHAGEAL DILATION;  Surgeon: Rogene Houston, MD;  [facility];  Service: Endoscopy;  Laterality: N/A;  Balloon, 15, 16.5  . ESOPHAGEAL DILATION N/A 06/03/2015   Procedure: ESOPHAGEAL DILATION WITH 16.5 MM BALLOON ;  Surgeon: Rogene Houston, MD;  [facility];  Service: Endoscopy;  Laterality: N/A;  . ESOPHAGEAL DILATION N/A 09/09/2015   Procedure: ESOPHAGEAL DILATION;  Surgeon: Rogene Houston, MD;  [facility];  Service: Endoscopy;  Laterality: N/A;  . ESOPHAGEAL DILATION N/A 05/04/2016   Procedure: ESOPHAGEAL DILATION;  Surgeon: Rogene Houston, MD;  [facility];  Service: Endoscopy;  Laterality: N/A;  . ESOPHAGEAL DILATION N/A 06/22/2016   Procedure: ESOPHAGEAL DILATION;  Surgeon: Rogene Houston, MD;  [facility];  Service: Endoscopy;  Laterality: N/A;  . ESOPHAGEAL DILATION N/A 05/21/2019   Procedure: ESOPHAGEAL DILATION;  Surgeon: Rogene Houston, MD;  [facility];  Service: Endoscopy;  Laterality: N/A;  . ESOPHAGEAL DILATION N/A 06/19/2019   Procedure:  ESOPHAGEAL DILATION;  Surgeon: Rogene Houston, MD;  [facility];  Service: Endoscopy;  Laterality: N/A;  . ESOPHAGOGASTRODUODENOSCOPY N/A 11/11/2014   Procedure: ESOPHAGOGASTRODUODENOSCOPY (EGD);  Surgeon: Rogene Houston, MD;  [facility];  Service: Endoscopy;  Laterality: N/A;  125-rescheduled 11/11/14 @ 10:30am Ann notified pt  . ESOPHAGOGASTRODUODENOSCOPY (EGD) WITH ESOPHAGEAL DILATION N/A 08/20/2013   Procedure: ESOPHAGOGASTRODUODENOSCOPY (EGD) WITH ESOPHAGEAL DILATION;  Surgeon: Rogene Houston, MD;  [facility];  Service: Endoscopy;  Laterality: N/A;  345-moved to 730 Ann notified pt  . ESOPHAGOGASTRODUODENOSCOPY (EGD) WITH PROPOFOL N/A 07/24/2013   Procedure: ESOPHAGOGASTRODUODENOSCOPY (EGD) WITH PROPOFOL UNDER FLUOROSCOPY;  Surgeon: Rogene Houston, MD;  [facility];  Service: Endoscopy;  Laterality: N/A;  . ESOPHAGOGASTRODUODENOSCOPY (EGD) WITH PROPOFOL N/A 11/30/2014   Procedure: ESOPHAGOGASTRODUODENOSCOPY (EGD) WITH PROPOFOL;  Surgeon: Rogene Houston, MD;  [facility];  Service: Endoscopy;  Laterality: N/A;  fluoro not needed  . ESOPHAGOGASTRODUODENOSCOPY (EGD) WITH PROPOFOL N/A 06/03/2015   Procedure: ESOPHAGOGASTRODUODENOSCOPY (EGD) WITH PROPOFOL; HIATUS AT 35 CM AND GE JUNCTION 40 CM;  Surgeon: Rogene Houston, MD;  [facility];  Service: Endoscopy;  Laterality: N/A;  . ESOPHAGOGASTRODUODENOSCOPY (EGD) WITH PROPOFOL N/A 09/09/2015   Procedure: ESOPHAGOGASTRODUODENOSCOPY (EGD) WITH PROPOFOL;  Surgeon: Rogene Houston, MD;  [facility];  Service: Endoscopy;  Laterality: N/A;  8:15  . ESOPHAGOGASTRODUODENOSCOPY (EGD) WITH PROPOFOL N/A 05/04/2016   Procedure: ESOPHAGOGASTRODUODENOSCOPY (EGD) WITH PROPOFOL;  Surgeon: Rogene Houston, MD;  [facility];  Service: Endoscopy;  Laterality: N/A;  9:30  . ESOPHAGOGASTRODUODENOSCOPY (EGD) WITH PROPOFOL N/A 06/22/2016   Procedure: ESOPHAGOGASTRODUODENOSCOPY (EGD) WITH PROPOFOL;   Surgeon: Rogene Houston, MD;  [facility];  Service: Endoscopy;  Laterality: N/A;  11:20  . ESOPHAGOGASTRODUODENOSCOPY (EGD) WITH PROPOFOL N/A 05/21/2019   Procedure: ESOPHAGOGASTRODUODENOSCOPY (EGD) WITH PROPOFOL;  Surgeon: Rogene Houston, MD;  [facility];  Service: Endoscopy;  Laterality: N/A;  2:30pm  . ESOPHAGOGASTRODUODENOSCOPY (EGD) WITH PROPOFOL N/A 06/19/2019   Procedure: ESOPHAGOGASTRODUODENOSCOPY (EGD) WITH PROPOFOL;  Surgeon: Rogene Houston, MD;  [facility];  Service: Endoscopy;  Laterality: N/A;  220pm  . INTRAMEDULLARY (IM) NAIL INTERTROCHANTERIC Left 09/18/2015   Procedure: INTRAMEDULLARY (IM) NAIL INTERTROCHANTRIC;  Surgeon: Carole Civil, MD;  [facility];  Service: Orthopedics;  Laterality: Left;  . TUBAL LIGATION       Current Meds  Medication Sig  . albuterol (VENTOLIN HFA) 108 (90 Base) MCG/ACT inhaler Inhale 2 puffs into the lungs every 6 (six) hours as needed for wheezing or shortness of breath.  . allopurinol (ZYLOPRIM) 100 MG tablet Take 1 tablet (100 mg total) by mouth daily.  Marland Kitchen BREO ELLIPTA 200-25 MCG/INH AEPB Inhale 1 puff into the lungs 2 (two) times daily.  Mariane Baumgarten Calcium (STOOL SOFTENER PO) Take 1 tablet by mouth daily.  . hydrochlorothiazide (HYDRODIURIL) 12.5 MG tablet Take 1 tablet (12.5 mg total) by mouth every Monday, Wednesday, and Friday.  Marland Kitchen LORazepam (ATIVAN) 0.5 MG tablet Take 1 tablet (0.5 mg total) by mouth 2 (two) times daily as needed. for anxiety  . metoprolol tartrate (LOPRESSOR) 25 MG tablet Take 1 tablet (25 mg total) by mouth 2 (two) times daily.  . mirtazapine (REMERON) 45 MG tablet Take 1 tablet (45 mg total) by mouth at bedtime.  . Multiple Vitamin (MULTIVITAMIN PO) Take 1 tablet by mouth daily.  . OXYGEN Inhale 2 L into the lungs continuous.   . pantoprazole (PROTONIX) 40 MG tablet TAKE 1 TABLET (40 MG TOTAL) BY MOUTH 2 (TWO) TIMES DAILY BEFORE A MEAL  . promethazine (PHENERGAN) 12.5 MG  tablet Take 1 tablet (12.5 mg total) by mouth every 8 (eight) hours as needed for nausea or vomiting.  . vitamin C (ASCORBIC ACID) 250 MG tablet Take 500 mg by mouth daily.      Allergies:   Other and Oxycodone   ROS:   Please see the history of present illness.    All other systems reviewed and are negative.   Labs/Other Tests and Data Reviewed:    Recent Labs: 08/31/2020: ALT 8; BUN 6; Creatinine, Ser 0.94; Hemoglobin 10.3; Platelets 313; Potassium 4.9; Sodium 140   Recent Lipid Panel No results found for: CHOL, TRIG, HDL, CHOLHDL, LDLCALC, LDLDIRECT  Wt Readings from Last 3 Encounters:  09/13/20 110 lb (49.9 kg)  08/31/20 110 lb 6.4 oz (50.1 kg)  03/10/20 105 lb (47.6 kg)     Objective:    Vital Signs:  Ht 5\' 8"  (1.727 m)   Wt 110 lb (49.9 kg)   BMI 16.73 kg/m    VITAL SIGNS:  reviewed GEN:  no acute distress RESPIRATORY:  Inability to complete sentences without having some shortness of breath daughter provides most of the conversation at this time PSYCH:  normal affect  ASSESSMENT & PLAN:    1. Pulmonary emphysema, unspecified emphysema type ([place]) Visit is cut short as she is advised to call 911/EMS to get her to the nearest emergency room to assess in person oxygen levels possible chest x-ray and labs.  Additionally would benefit from COVID testing.  Time:   Today, I have spent 5 minutes with the patient with telehealth technology discussing the above problems.     Medication Adjustments/Labs and Tests Ordered: Current medicines are reviewed at length with the patient today.  Concerns regarding medicines are outlined above.   Tests Ordered: No orders of the defined types were placed in this encounter.   Medication Changes: No orders of the defined types were placed in this encounter.    Disposition:  Follow up as scheduled Signed, Perlie Mayo, NP  09/13/2020 2:08 PM     [facility]

## 2020-09-13 NOTE — ED Provider Notes (Signed)
[facility] EMERGENCY DEPARTMENT Provider Note   CSN: [id number] Arrival date & time: 09/13/20  1548     History Chief Complaint  Patient presents with  . Shortness of Breath    Taylor Cannon is a 87 y.o. female.  She has a history of COPD and is on home oxygen, although it does not sound like she has not been using it regularly until recently.  She said for the last 5 days she has had chest congestion cough productive of some nasty tasting sputum.  She does not know what color it is.  Increased shortness of breath and dyspnea on exertion.  She noticed her oxygen levels are fluctuating with any type of activity and leads to tachycardia.  She had a video visit with her family practitioner today and they recommended she come to the emergency department for evaluation.  She denies any fever or chills.  No vomiting or diarrhea.  No recent falls.  She has been COVID vaccinated but has not received her booster shot.  The history is provided by the patient.  Shortness of Breath Severity:  Moderate Onset quality:  Gradual Timing:  Intermittent Progression:  Worsening Chronicity:  New Context: activity   Relieved by:  Rest and oxygen Worsened by:  Activity Ineffective treatments:  None tried Associated symptoms: cough and sputum production   Associated symptoms: no abdominal pain, no chest pain, no fever, no headaches, no rash, no sore throat and no vomiting        Past Medical History:  Diagnosis Date  . Anxiety   . Benign paroxysmal positional vertigo 10/05/2014  . Closed fracture of intertrochanteric section of femur ([place])   . Closed left hip fracture ([place]) 09/16/2015  . COPD (chronic obstructive pulmonary disease) ([place])   . Depression   . Dysphagia, pharyngoesophageal phase 10/05/2014  . Esophageal stricture 04/11/2016   Added automatically from request for surgery [id number]  . Fall 09/16/2015  . Fall at home, initial encounter 02/28/2017  . Gastroesophageal reflux disease 05/26/2019   . GERD (gastroesophageal reflux disease)   . Gout 05/26/2019  . Intertrochanteric fracture of left femur ([place]) 09/18/2015  . Malnutrition ([place]) 05/26/2019  . Nausea 05/26/2019  . On home oxygen therapy    continuous at this time  . Oxygen deficiency   . S/P ORIF (open reduction internal fixation) fracture left hip 09/18/15 IM nail  05/27/2018  . TIA (transient ischemic attack)     Patient Active Problem List   Diagnosis Date Noted  . Impaired left ventricular relaxation 09/08/2020  . Essential hypertension 08/31/2020  . Low oxygen saturation 08/31/2020  . Hoarseness of voice 12/02/2019  . Leg swelling 12/02/2019  . Dysphagia 06/01/2019  . At maximum risk for fall 05/26/2019  . Anxiety 05/26/2019  . Insomnia due to medical condition 05/26/2019  . Severe protein-calorie malnutrition ([place]) 09/17/2015  . Chronic obstructive pulmonary disease ([place]) 09/16/2015  . Former cigarette smoker 09/16/2015  . GERD (gastroesophageal reflux disease) 09/16/2015    Past Surgical History:  Procedure Laterality Date  . APPENDECTOMY    . BALLOON DILATION N/A 07/24/2013   Procedure: BALLOON DILATION to 13.23mm;  Surgeon: Rogene Houston, MD;  Location: [facility];  Service: Endoscopy;  Laterality: N/A;  . BALLOON DILATION N/A 11/11/2014   Procedure: BALLOON DILATION;  Surgeon: Rogene Houston, MD;  Location: [facility];  Service: Endoscopy;  Laterality: N/A;  . CARDIAC ELECTROPHYSIOLOGY STUDY AND ABLATION    . CATARACT EXTRACTION Bilateral   . CERVICAL  CONE BIOPSY    . ESOPHAGEAL DILATION N/A 11/30/2014   Procedure: ESOPHAGEAL DILATION;  Surgeon: Rogene Houston, MD;  Location: [facility];  Service: Endoscopy;  Laterality: N/A;  Balloon, 15, 16.5  . ESOPHAGEAL DILATION N/A 06/03/2015   Procedure: ESOPHAGEAL DILATION WITH 16.5 MM BALLOON ;  Surgeon: Rogene Houston, MD;  Location: [facility];  Service: Endoscopy;  Laterality: N/A;  . ESOPHAGEAL DILATION N/A 09/09/2015   Procedure: ESOPHAGEAL DILATION;  Surgeon:  Rogene Houston, MD;  [facility]: [facility];  Service: Endoscopy;  Laterality: N/A;  . ESOPHAGEAL DILATION N/A 05/04/2016   Procedure: ESOPHAGEAL DILATION;  Surgeon: Rogene Houston, MD;  [facility];  Service: Endoscopy;  Laterality: N/A;  . ESOPHAGEAL DILATION N/A 06/22/2016   Procedure: ESOPHAGEAL DILATION;  Surgeon: Rogene Houston, MD;  [facility];  Service: Endoscopy;  Laterality: N/A;  . ESOPHAGEAL DILATION N/A 05/21/2019   Procedure: ESOPHAGEAL DILATION;  Surgeon: Rogene Houston, MD;  [facility];  Service: Endoscopy;  Laterality: N/A;  . ESOPHAGEAL DILATION N/A 06/19/2019   Procedure: ESOPHAGEAL DILATION;  Surgeon: Rogene Houston, MD;  [facility];  Service: Endoscopy;  Laterality: N/A;  . ESOPHAGOGASTRODUODENOSCOPY N/A 11/11/2014   Procedure: ESOPHAGOGASTRODUODENOSCOPY (EGD);  Surgeon: Rogene Houston, MD;  [facility];  Service: Endoscopy;  Laterality: N/A;  125-rescheduled 11/11/14 @ 10:30am Ann notified pt  . ESOPHAGOGASTRODUODENOSCOPY (EGD) WITH ESOPHAGEAL DILATION N/A 08/20/2013   Procedure: ESOPHAGOGASTRODUODENOSCOPY (EGD) WITH ESOPHAGEAL DILATION;  Surgeon: Rogene Houston, MD;  [facility];  Service: Endoscopy;  Laterality: N/A;  345-moved to 730 Ann notified pt  . ESOPHAGOGASTRODUODENOSCOPY (EGD) WITH PROPOFOL N/A 07/24/2013   Procedure: ESOPHAGOGASTRODUODENOSCOPY (EGD) WITH PROPOFOL UNDER FLUOROSCOPY;  Surgeon: Rogene Houston, MD;  Location: [facility];  Service: Endoscopy;  Laterality: N/A;  . ESOPHAGOGASTRODUODENOSCOPY (EGD) WITH PROPOFOL N/A 11/30/2014   Procedure: ESOPHAGOGASTRODUODENOSCOPY (EGD) WITH PROPOFOL;  Surgeon: Rogene Houston, MD;  Location: [facility];  Service: Endoscopy;  Laterality: N/A;  fluoro not needed  . ESOPHAGOGASTRODUODENOSCOPY (EGD) WITH PROPOFOL N/A 06/03/2015   Procedure: ESOPHAGOGASTRODUODENOSCOPY (EGD) WITH PROPOFOL; HIATUS AT 35 CM AND GE JUNCTION 40 CM;  Surgeon: Rogene Houston,  MD;  Location: [facility];  Service: Endoscopy;  Laterality: N/A;  . ESOPHAGOGASTRODUODENOSCOPY (EGD) WITH PROPOFOL N/A 09/09/2015   Procedure: ESOPHAGOGASTRODUODENOSCOPY (EGD) WITH PROPOFOL;  Surgeon: Rogene Houston, MD;  [facility];  Service: Endoscopy;  Laterality: N/A;  8:15  . ESOPHAGOGASTRODUODENOSCOPY (EGD) WITH PROPOFOL N/A 05/04/2016   Procedure: ESOPHAGOGASTRODUODENOSCOPY (EGD) WITH PROPOFOL;  Surgeon: Rogene Houston, MD;  [facility];  Service: Endoscopy;  Laterality: N/A;  9:30  . ESOPHAGOGASTRODUODENOSCOPY (EGD) WITH PROPOFOL N/A 06/22/2016   Procedure: ESOPHAGOGASTRODUODENOSCOPY (EGD) WITH PROPOFOL;  Surgeon: Rogene Houston, MD;  [facility];  Service: Endoscopy;  Laterality: N/A;  11:20  . ESOPHAGOGASTRODUODENOSCOPY (EGD) WITH PROPOFOL N/A 05/21/2019   Procedure: ESOPHAGOGASTRODUODENOSCOPY (EGD) WITH PROPOFOL;  Surgeon: Rogene Houston, MD;  [facility];  Service: Endoscopy;  Laterality: N/A;  2:30pm  . ESOPHAGOGASTRODUODENOSCOPY (EGD) WITH PROPOFOL N/A 06/19/2019   Procedure: ESOPHAGOGASTRODUODENOSCOPY (EGD) WITH PROPOFOL;  Surgeon: Rogene Houston, MD;  [facility];  Service: Endoscopy;  Laterality: N/A;  220pm  . INTRAMEDULLARY (IM) NAIL INTERTROCHANTERIC Left 09/18/2015   Procedure: INTRAMEDULLARY (IM) NAIL INTERTROCHANTRIC;  Surgeon: Carole Civil, MD;  Location: [facility];  Service: Orthopedics;  Laterality: Left;  . TUBAL LIGATION  OB History   No obstetric history on file.     No family history on file.  Social History   Tobacco Use  . Smoking status: Former Smoker    Packs/day: 1.00    Years: 55.00    Pack years: 55.00    Types: Cigarettes    Quit date: 05/12/2019    Years since quitting: 1.3  . Smokeless tobacco: Never Used  . Tobacco comment: quit smoking since 1st of July.  Smoked all her life.   Vaping Use  . Vaping Use: Never used  Substance Use Topics  . Alcohol use: Yes    Comment: 2  shots at night  . Drug use: No    Home Medications Prior to Admission medications   Medication Sig Start Date End Date Taking? Authorizing Provider  albuterol (VENTOLIN HFA) 108 (90 Base) MCG/ACT inhaler Inhale 2 puffs into the lungs every 6 (six) hours as needed for wheezing or shortness of breath. 08/31/20   Perlie Mayo, NP  allopurinol (ZYLOPRIM) 100 MG tablet Take 1 tablet (100 mg total) by mouth daily. 04/06/20   Perlie Mayo, NP  BREO ELLIPTA 200-25 MCG/INH AEPB Inhale 1 puff into the lungs 2 (two) times daily. 08/31/20   Perlie Mayo, NP  Docusate Calcium (STOOL SOFTENER PO) Take 1 tablet by mouth daily.    [provider]  hydrochlorothiazide (HYDRODIURIL) 12.5 MG tablet Take 1 tablet (12.5 mg total) by mouth every Monday, Wednesday, and Friday. 08/31/20   Perlie Mayo, NP  LORazepam (ATIVAN) 0.5 MG tablet Take 1 tablet (0.5 mg total) by mouth 2 (two) times daily as needed. for anxiety 08/31/20   Perlie Mayo, NP  metoprolol tartrate (LOPRESSOR) 25 MG tablet Take 1 tablet (25 mg total) by mouth 2 (two) times daily. 08/18/20 09/17/20  Perlie Mayo, NP  mirtazapine (REMERON) 45 MG tablet Take 1 tablet (45 mg total) by mouth at bedtime. 04/20/20   Perlie Mayo, NP  Multiple Vitamin (MULTIVITAMIN PO) Take 1 tablet by mouth daily.    [provider]  OXYGEN Inhale 2 L into the lungs continuous.     [provider]  pantoprazole (PROTONIX) 40 MG tablet TAKE 1 TABLET (40 MG TOTAL) BY MOUTH 2 (TWO) TIMES DAILY BEFORE A MEAL 06/20/20   Laurine Blazer B, PA-C  promethazine (PHENERGAN) 12.5 MG tablet Take 1 tablet (12.5 mg total) by mouth every 8 (eight) hours as needed for nausea or vomiting. 07/07/19   Perlie Mayo, NP  vitamin C (ASCORBIC ACID) 250 MG tablet Take 500 mg by mouth daily.     [provider]    Allergies    Other and Oxycodone  Review of Systems   Review of Systems  Constitutional: Negative for fever.  HENT: Negative  for sore throat.   Eyes: Negative for visual disturbance.  Respiratory: Positive for cough, sputum production and shortness of breath.   Cardiovascular: Negative for chest pain.  Gastrointestinal: Negative for abdominal pain and vomiting.  Genitourinary: Negative for dysuria.  Musculoskeletal: Negative for myalgias.  Skin: Negative for rash.  Neurological: Negative for headaches.    Physical Exam Updated Vital Signs BP (!) 108/46 (BP Location: Right Arm)   Pulse 76   Temp 98.1 F (36.7 C) (Oral)   Resp 17   SpO2 100%   Physical Exam Vitals and nursing note reviewed.  Constitutional:      General: She is not in acute distress.    Appearance: She  is well-developed, underweight and well-nourished.  HENT:     Head: Normocephalic and atraumatic.  Eyes:     Conjunctiva/sclera: Conjunctivae normal.  Cardiovascular:     Rate and Rhythm: Normal rate and regular rhythm.     Heart sounds: No murmur heard.   Pulmonary:     Effort: Pulmonary effort is normal. No respiratory distress.     Breath sounds: Rhonchi present.  Abdominal:     Palpations: Abdomen is soft.     Tenderness: There is no abdominal tenderness.  Musculoskeletal:        General: No edema. Normal range of motion.     Cervical back: Neck supple.     Right lower leg: No tenderness. No edema.     Left lower leg: No tenderness. No edema.  Skin:    General: Skin is warm and dry.     Capillary Refill: Capillary refill takes less than 2 seconds.  Neurological:     General: No focal deficit present.     Mental Status: She is alert.  Psychiatric:        Mood and Affect: Mood and affect normal.     ED Results / Procedures / Treatments   Labs (all labs ordered are listed, but only abnormal results are displayed) Labs Reviewed  BASIC METABOLIC PANEL - Abnormal; Notable for the following components:      Result Value   Glucose, Bld 103 (*)    Creatinine, Ser 1.06 (*)    Calcium 8.2 (*)    GFR, Estimated 52 (*)     All other components within normal limits  CBC WITH DIFFERENTIAL/PLATELET - Abnormal; Notable for the following components:   RBC 3.19 (*)    Hemoglobin 9.8 (*)    HCT 32.2 (*)    MCV 100.9 (*)    RDW 17.5 (*)    Lymphs Abs 0.4 (*)    All other components within normal limits  BRAIN NATRIURETIC PEPTIDE - Abnormal; Notable for the following components:   B Natriuretic Peptide 146.0 (*)    All other components within normal limits  RESP PANEL BY RT-PCR (FLU A&B, COVID) [id number]    EKG EKG Interpretation  Date/Time:  Tuesday September 13 2020 16:47:13 EST Ventricular Rate:  74 PR Interval:    QRS Duration: 92 QT Interval:  431 QTC Calculation: 479 R Axis:   -25 Text Interpretation: Sinus rhythm Borderline left axis deviation Low voltage, extremity and precordial leads Abnormal R-wave progression, early transition No significant change since prior 9/20 Confirmed by Aletta Edouard [phone]) on 09/13/2020 4:59:16 PM   Radiology CT Angio Chest PE W/Cm &/Or Wo Cm  Result Date: 09/13/2020 CLINICAL DATA:  87 year old female with chest pain and shortness of breath. EXAM: CT ANGIOGRAPHY CHEST WITH CONTRAST TECHNIQUE: Multidetector CT imaging of the chest was performed using the standard protocol during bolus administration of intravenous contrast. Multiplanar CT image reconstructions and MIPs were obtained to evaluate the vascular anatomy. CONTRAST:  72mL OMNIPAQUE IOHEXOL 350 MG/ML SOLN COMPARISON:  Chest radiograph dated 09/13/2020. FINDINGS: Cardiovascular: Mild cardiomegaly. Pericardial effusion measures approximately 1 cm along the left ventricle. Moderate atherosclerotic calcification of the thoracic aorta. No aneurysmal dilatation. No dissection. No pulmonary artery embolus identified. Mediastinum/Nodes: No hilar or mediastinal adenopathy. There is a large hiatal hernia. The esophagus is grossly unremarkable. No mediastinal fluid collection. Lungs/Pleura: Moderate centrilobular emphysema.  Trace bilateral pleural effusions. Bibasilar subsegmental consolidative changes, left greater right may represent atelectasis or infiltrate. Clinical correlation is recommended. There is no  pneumothorax. Bilateral lower lobe peribronchial thickening. The central airways are patent. Upper Abdomen: Splenomegaly. Left adrenal thickening/hyperplasia. Musculoskeletal: Osteopenia with degenerative changes of the spine. Age indeterminate T5 compression fracture with approximately 50% loss of vertebral body height. No acute osseous pathology. Review of the MIP images confirms the above findings. IMPRESSION: 1. No CT evidence of pulmonary embolism. 2. Trace bilateral pleural effusions with bibasilar subsegmental consolidative changes, left greater right may represent atelectasis or infiltrate. Clinical correlation is recommended. 3. Mild cardiomegaly with small pericardial effusion. 4. Large hiatal hernia. 5. Splenomegaly. 6. Age indeterminate T5 compression fracture. 7. Aortic Atherosclerosis (ICD10-I70.0) and Emphysema (ICD10-J43.9). Electronically Signed   By: Anner Crete M.D.   On: 09/13/2020 19:37   DG Chest Port 1 View  Result Date: 09/13/2020 CLINICAL DATA:  87 year old female with shortness of breath. EXAM: PORTABLE CHEST 1 VIEW COMPARISON:  Chest radiograph dated 05/12/2019. FINDINGS: Small left pleural effusion and left lung base atelectasis or infiltrate. No pneumothorax. Biapical subpleural scarring. Stable cardiomegaly. Atherosclerotic calcification of the aorta. Osteopenia with degenerative changes of the spine. No acute osseous pathology. IMPRESSION: Small left pleural effusion and left lung base atelectasis or infiltrate. Electronically Signed   By: Anner Crete M.D.   On: 09/13/2020 17:14    Procedures Procedures (including critical care time)  Medications Ordered in ED Medications  iohexol (OMNIPAQUE) 350 MG/ML injection 75 mL (75 mLs Intravenous Contrast Given 09/13/20 1913)   methylPREDNISolone sodium succinate (SOLU-MEDROL) 125 mg/2 mL injection 80 mg (80 mg Intravenous Given 09/13/20 2028)  azithromycin (ZITHROMAX) 200 MG/5ML suspension 500 mg (500 mg Oral Given 09/13/20 2024)    ED Course  I have reviewed the triage vital signs and the nursing notes.  Pertinent labs & imaging results that were available during my care of the patient were reviewed by me and considered in my medical decision making (see chart for details).    MDM Rules/Calculators/A&P                         Humna C Rosser was evaluated in Emergency Department on 09/13/2020 for the symptoms described in the history of present illness. She was evaluated in the context of the global COVID-19 pandemic, which necessitated consideration that the patient might be at risk for infection with the SARS-CoV-2 virus that causes COVID-19. Institutional protocols and algorithms that pertain to the evaluation of patients at risk for COVID-19 are in a state of rapid change based on information released by regulatory bodies including the CDC and federal and state organizations. These policies and algorithms were followed during the patient's care in the ED.  This patient complains of increased shortness of breath; this involves an extensive number of treatment Options and is a complaint that carries with it a high risk of complications and Morbidity. The differential includes COPD, CHF, pneumonia, pneumothorax, COVID, anemia  I ordered, reviewed and interpreted labs, which included CBC with normal white count, hemoglobin slightly lower than baseline, chemistries fairly unremarkable, BNP mildly elevated, COVID and flu testing negative I ordered medication IV steroids and antibiotics I ordered imaging studies which included chest x-ray and CT angio chest and I independently    visualized and interpreted imaging which showed no PE, does have bibasilar atelectasis versus infiltrates, small pericardial effusion, tiny  pleural effusions bilaterally  Previous records obtained and reviewed in epic including multiple visits and conversations with over the last month  After the interventions stated above, I reevaluated the patient and found patient  to be breathing comfortably and satting well on her home oxygen.  She is comfortable with plan for trial of steroids and antibiotics.  Reviewed her x-ray findings with her  Have placed a referral in for cardiology follow-up for her small pericardial effusion.  Currently no indications for admission.  Recommended close follow-up with her PCP.  Return instructions discussed   Final Clinical Impression(s) / ED Diagnoses Final diagnoses:  COPD exacerbation ([place])  Pleural effusion, bilateral  Pericardial effusion    Rx / DC Orders ED Discharge Orders    None       Hayden Rasmussen, MD 09/14/20 1031

## 2020-09-13 NOTE — Patient Instructions (Signed)
  I appreciate the opportunity to provide you with care for your health and wellness.  Follow up: 09/28/2020 -as scheduled  No labs or referrals today  ED recommended   Please continue to practice social distancing to keep you, your family, and our community safe.  If you must go out, please wear a mask and practice good handwashing.  It was a pleasure to see you and I look forward to continuing to work together on your health and well-being. Please do not hesitate to call the office if you need care or have questions about your care.  Have a wonderful day. With Gratitude, Cherly Beach, DNP, AGNP-BC

## 2020-09-19 ENCOUNTER — Encounter: Payer: Self-pay | Admitting: Family Medicine

## 2020-09-20 ENCOUNTER — Other Ambulatory Visit: Payer: Self-pay | Admitting: Family Medicine

## 2020-09-20 ENCOUNTER — Other Ambulatory Visit: Payer: Self-pay

## 2020-09-20 DIAGNOSIS — J439 Emphysema, unspecified: Secondary | ICD-10-CM

## 2020-09-20 MED ORDER — NYSTATIN 100000 UNIT/ML MT SUSP: 5.0000 mL | Freq: Three times a day (TID) | OROMUCOSAL | 0 refills | Status: DC

## 2020-09-20 NOTE — Progress Notes (Signed)
Nystatin

## 2020-09-20 NOTE — Telephone Encounter (Signed)
I will send in nystatin to help with the thrush and please place a referral for Pulm- as I would very much like this as we have discussed it in the past.

## 2020-09-21 NOTE — Telephone Encounter (Signed)
The ED provider placed the Cardiology referral. I think if they can make it that would be good, but I understand if not. I definitely want them to keep the pulm appt- please see if they have one yet.

## 2020-09-22 ENCOUNTER — Ambulatory Visit: Payer: Medicare HMO | Admitting: Cardiology

## 2020-09-22 ENCOUNTER — Encounter: Payer: Self-pay | Admitting: Cardiology

## 2020-09-22 ENCOUNTER — Other Ambulatory Visit: Payer: Self-pay

## 2020-09-22 VITALS — BP 142/60 | HR 95 | Ht 68.0 in | Wt 101.4 lb

## 2020-09-22 DIAGNOSIS — I3139 Other pericardial effusion (noninflammatory): Secondary | ICD-10-CM

## 2020-09-22 DIAGNOSIS — I313 Pericardial effusion (noninflammatory): Secondary | ICD-10-CM

## 2020-09-22 NOTE — Patient Instructions (Signed)
Medication Instructions:  Your physician recommends that you continue on your current medications as directed. Please refer to the Current Medication list given to you today.  *If you need a refill on your cardiac medications before your next appointment, please call your pharmacy*   Lab Work: None Today If you have labs (blood work) drawn today and your tests are completely normal, you will receive your results only by: Marland Kitchen MyChart Message (if you have MyChart) OR . A paper copy in the mail If you have any lab test that is abnormal or we need to change your treatment, we will call you to review the results.   Testing/Procedures:  Echocardiogram An echocardiogram is a test that uses sound waves (ultrasound) to produce images of the heart. Images from an echocardiogram can provide important information about:  Heart size and shape.  The size and thickness and movement of your heart's walls.  Heart muscle function and strength.  Heart valve function or if you have stenosis. Stenosis is when the heart valves are too narrow.  If blood is flowing backward through the heart valves (regurgitation).  A tumor or infectious growth around the heart valves.  Areas of heart muscle that are not working well because of poor blood flow or injury from a heart attack.  Aneurysm detection. An aneurysm is a weak or damaged part of an artery wall. The wall bulges out from the normal force of blood pumping through the body. Tell a health care provider about:  Any allergies you have.  All medicines you are taking, including vitamins, herbs, eye drops, creams, and over-the-counter medicines.  Any blood disorders you have.  Any surgeries you have had.  Any medical conditions you have.  Whether you are pregnant or may be pregnant. What are the risks? Generally, this is a safe test. However, problems may occur, including an allergic reaction to dye (contrast) that may be used during the  test. What happens before the test? No specific preparation is needed. You may eat and drink normally. What happens during the test?  You will take off your clothes from the waist up and put on a hospital gown.  Electrodes or electrocardiogram (ECG)patches may be placed on your chest. The electrodes or patches are then connected to a device that monitors your heart rate and rhythm.  You will lie down on a table for an ultrasound exam. A gel will be applied to your chest to help sound waves pass through your skin.  A handheld device, called a transducer, will be pressed against your chest and moved over your heart. The transducer produces sound waves that travel to your heart and bounce back (or "echo" back) to the transducer. These sound waves will be captured in real-time and changed into images of your heart that can be viewed on a video monitor. The images will be recorded on a computer and reviewed by your health care provider.  You may be asked to change positions or hold your breath for a short time. This makes it easier to get different views or better views of your heart.  In some cases, you may receive contrast through an IV in one of your veins. This can improve the quality of the pictures from your heart. The procedure may vary among health care providers and hospitals.   What can I expect after the test? You may return to your normal, everyday life, including diet, activities, and medicines, unless your health care provider tells you not to  do that. Follow these instructions at home:  It is up to you to get the results of your test. Ask your health care provider, or the department that is doing the test, when your results will be ready.  Keep all follow-up visits. This is important. Summary  An echocardiogram is a test that uses sound waves (ultrasound) to produce images of the heart.  Images from an echocardiogram can provide important information about the size and shape of  your heart, heart muscle function, heart valve function, and other possible heart problems.  You do not need to do anything to prepare before this test. You may eat and drink normally.  After the echocardiogram is completed, you may return to your normal, everyday life, unless your health care provider tells you not to do that. This information is not intended to replace advice given to you by your health care provider. Make sure you discuss any questions you have with your health care provider. Document Revised: 04/12/2020 Document Reviewed: 04/12/2020 Elsevier Patient Education  2021 Portal: At [facility], you and your health needs are our priority.  As part of our continuing mission to provide you with exceptional heart care, we have created designated Provider Care Teams.  These Care Teams include your primary Cardiologist (physician) and Advanced Practice Providers (APPs -  Physician Assistants and Nurse Practitioners) who all work together to provide you with the care you need, when you need it.  We recommend signing up for the patient portal called "MyChart".  Sign up information is provided on this After Visit Summary.  MyChart is used to connect with patients for Virtual Visits (Telemedicine).  Patients are able to view lab/test results, encounter notes, upcoming appointments, etc.  Non-urgent messages can be sent to your provider as well.   To learn more about what you can do with MyChart, go to [email or web address].      Other Instructions None Today

## 2020-09-22 NOTE — Progress Notes (Signed)
Cardiology Office Note  Date: 09/22/2020   ID: Taylor Cannon, Taylor Cannon Jul 27, 1934, MRN [id number]  PCP:  Perlie Mayo, NP  Cardiologist:  Rozann Lesches, MD Electrophysiologist:  None   Chief Complaint  Patient presents with  . Shortness of Breath    History of Present Illness: Taylor Cannon is an 87 y.o. female referred for cardiology consultation by Dr. Melina Copa after recent ER visit at [facility].  Per review of the records, consultation was made due to incidental finding of a small pericardial effusion by chest CT.  She had been seen for shortness of breath that was most consistent with pulmonary etiology and in fact was treated with steroids and antibiotics.  She is here today with her daughter.  She has a reported history of COPD and chronic hypoxic respiratory failure, has been using oxygen 2 L via nasal cannula since her ER encounter on a regular basis, prior to that intermittently.  Her daughter tells me that Taylor Cannon has had ambulatory hypoxemia on a regular basis, also fluctuations in heart rate when her oxygen level drops.  She has not had regular follow-up with a Pulmonologist, does remember seeing Dr. Koleen Nimrod in the past.  Recent chest CTA was negative for pulmonary embolus.  She did have tiny pleural effusions and a small pericardial effusion, mild cardiomegaly.  No known history of cardiomyopathy.  Echocardiogram from 2020 revealed LVEF 60 to 65% with normal RV contraction, mild left atrial enlargement, sclerotic aortic valve without stenosis, moderate tricuspid regurgitation, and high normal estimated PASP.  Past Medical History:  Diagnosis Date  . Anxiety   . Benign paroxysmal positional vertigo   . COPD (chronic obstructive pulmonary disease) ([place])   . Depression   . Esophageal stricture   . Fall   . GERD (gastroesophageal reflux disease)   . Gout   . Intertrochanteric fracture of left femur ([place]) 09/18/2015  . Malnutrition ([place])   . On home oxygen therapy    . S/P ORIF (open reduction internal fixation) fracture left hip 09/18/15 IM nail   . TIA (transient ischemic attack)     Past Surgical History:  Procedure Laterality Date  . APPENDECTOMY    . BALLOON DILATION N/A 07/24/2013   Procedure: BALLOON DILATION to 13.58mm;  Surgeon: Rogene Houston, MD;  Location: [facility];  Service: Endoscopy;  Laterality: N/A;  . BALLOON DILATION N/A 11/11/2014   Procedure: BALLOON DILATION;  Surgeon: Rogene Houston, MD;  [facility];  Service: Endoscopy;  Laterality: N/A;  . CARDIAC ELECTROPHYSIOLOGY STUDY AND ABLATION    . CATARACT EXTRACTION Bilateral   . CERVICAL CONE BIOPSY    . ESOPHAGEAL DILATION N/A 11/30/2014   Procedure: ESOPHAGEAL DILATION;  Surgeon: Rogene Houston, MD;  Location: [facility];  Service: Endoscopy;  Laterality: N/A;  Balloon, 15, 16.5  . ESOPHAGEAL DILATION N/A 06/03/2015   Procedure: ESOPHAGEAL DILATION WITH 16.5 MM BALLOON ;  Surgeon: Rogene Houston, MD;  Location: [facility];  Service: Endoscopy;  Laterality: N/A;  . ESOPHAGEAL DILATION N/A 09/09/2015   Procedure: ESOPHAGEAL DILATION;  Surgeon: Rogene Houston, MD;  [facility];  Service: Endoscopy;  Laterality: N/A;  . ESOPHAGEAL DILATION N/A 05/04/2016   Procedure: ESOPHAGEAL DILATION;  Surgeon: Rogene Houston, MD;  [facility];  Service: Endoscopy;  Laterality: N/A;  . ESOPHAGEAL DILATION N/A 06/22/2016   Procedure: ESOPHAGEAL DILATION;  Surgeon: Rogene Houston, MD;  [facility];  Service: Endoscopy;  Laterality: N/A;  . ESOPHAGEAL DILATION N/A 05/21/2019   Procedure: ESOPHAGEAL DILATION;  Surgeon: Rogene Houston, MD;  [facility];  Service: Endoscopy;  Laterality: N/A;  . ESOPHAGEAL DILATION N/A 06/19/2019   Procedure: ESOPHAGEAL DILATION;  Surgeon: Rogene Houston, MD;  [facility];  Service: Endoscopy;  Laterality: N/A;  . ESOPHAGOGASTRODUODENOSCOPY N/A 11/11/2014   Procedure: ESOPHAGOGASTRODUODENOSCOPY (EGD);   Surgeon: Rogene Houston, MD;  [facility];  Service: Endoscopy;  Laterality: N/A;  125-rescheduled 11/11/14 @ 10:30am Ann notified pt  . ESOPHAGOGASTRODUODENOSCOPY (EGD) WITH ESOPHAGEAL DILATION N/A 08/20/2013   Procedure: ESOPHAGOGASTRODUODENOSCOPY (EGD) WITH ESOPHAGEAL DILATION;  Surgeon: Rogene Houston, MD;  [facility];  Service: Endoscopy;  Laterality: N/A;  345-moved to 730 Ann notified pt  . ESOPHAGOGASTRODUODENOSCOPY (EGD) WITH PROPOFOL N/A 07/24/2013   Procedure: ESOPHAGOGASTRODUODENOSCOPY (EGD) WITH PROPOFOL UNDER FLUOROSCOPY;  Surgeon: Rogene Houston, MD;  [facility];  Service: Endoscopy;  Laterality: N/A;  . ESOPHAGOGASTRODUODENOSCOPY (EGD) WITH PROPOFOL N/A 11/30/2014   Procedure: ESOPHAGOGASTRODUODENOSCOPY (EGD) WITH PROPOFOL;  Surgeon: Rogene Houston, MD;  [facility]: [facility];  Service: Endoscopy;  Laterality: N/A;  fluoro not needed  . ESOPHAGOGASTRODUODENOSCOPY (EGD) WITH PROPOFOL N/A 06/03/2015   Procedure: ESOPHAGOGASTRODUODENOSCOPY (EGD) WITH PROPOFOL; HIATUS AT 35 CM AND GE JUNCTION 40 CM;  Surgeon: Rogene Houston, MD;  [facility];  Service: Endoscopy;  Laterality: N/A;  . ESOPHAGOGASTRODUODENOSCOPY (EGD) WITH PROPOFOL N/A 09/09/2015   Procedure: ESOPHAGOGASTRODUODENOSCOPY (EGD) WITH PROPOFOL;  Surgeon: Rogene Houston, MD;  [facility];  Service: Endoscopy;  Laterality: N/A;  8:15  . ESOPHAGOGASTRODUODENOSCOPY (EGD) WITH PROPOFOL N/A 05/04/2016   Procedure: ESOPHAGOGASTRODUODENOSCOPY (EGD) WITH PROPOFOL;  Surgeon: Rogene Houston, MD;  [facility];  Service: Endoscopy;  Laterality: N/A;  9:30  . ESOPHAGOGASTRODUODENOSCOPY (EGD) WITH PROPOFOL N/A 06/22/2016   Procedure: ESOPHAGOGASTRODUODENOSCOPY (EGD) WITH PROPOFOL;  Surgeon: Rogene Houston, MD;  [facility];  Service: Endoscopy;  Laterality: N/A;  11:20  . ESOPHAGOGASTRODUODENOSCOPY (EGD) WITH PROPOFOL N/A 05/21/2019   Procedure: ESOPHAGOGASTRODUODENOSCOPY (EGD)  WITH PROPOFOL;  Surgeon: Rogene Houston, MD;  [facility];  Service: Endoscopy;  Laterality: N/A;  2:30pm  . ESOPHAGOGASTRODUODENOSCOPY (EGD) WITH PROPOFOL N/A 06/19/2019   Procedure: ESOPHAGOGASTRODUODENOSCOPY (EGD) WITH PROPOFOL;  Surgeon: Rogene Houston, MD;  [facility];  Service: Endoscopy;  Laterality: N/A;  220pm  . INTRAMEDULLARY (IM) NAIL INTERTROCHANTERIC Left 09/18/2015   Procedure: INTRAMEDULLARY (IM) NAIL INTERTROCHANTRIC;  Surgeon: Carole Civil, MD;  Location: [facility];  Service: Orthopedics;  Laterality: Left;  . TUBAL LIGATION      Current Outpatient Medications  Medication Sig Dispense Refill  . albuterol (VENTOLIN HFA) 108 (90 Base) MCG/ACT inhaler Inhale 2 puffs into the lungs every 6 (six) hours as needed for wheezing or shortness of breath. 6.7 g 3  . allopurinol (ZYLOPRIM) 100 MG tablet Take 1 tablet (100 mg total) by mouth daily. 30 tablet 5  . BREO ELLIPTA 200-25 MCG/INH AEPB Inhale 1 puff into the lungs 2 (two) times daily. 28 each 3  . hydrochlorothiazide (HYDRODIURIL) 12.5 MG tablet Take 1 tablet (12.5 mg total) by mouth every Monday, Wednesday, and Friday. 30 tablet 1  . LORazepam (ATIVAN) 0.5 MG tablet Take 1 tablet (0.5 mg total) by mouth 2 (two) times daily as needed. for anxiety 30 tablet 0  . metoprolol tartrate (LOPRESSOR) 25 MG tablet Take 1 tablet (25 mg total) by mouth 2 (two) times  daily. 60 tablet 1  . mirtazapine (REMERON) 45 MG tablet Take 1 tablet (45 mg total) by mouth at bedtime. 90 tablet 1  . Multiple Vitamin (MULTIVITAMIN PO) Take 1 tablet by mouth daily.    Marland Kitchen nystatin (MYCOSTATIN) 100000 UNIT/ML suspension Take 5 mLs (500,000 Units total) by mouth in the morning, at noon, and at bedtime. 60 mL 0  . OXYGEN Inhale 2 L into the lungs continuous.     . pantoprazole (PROTONIX) 40 MG tablet TAKE 1 TABLET (40 MG TOTAL) BY MOUTH 2 (TWO) TIMES DAILY BEFORE A MEAL 180 tablet 1  . vitamin C (ASCORBIC ACID) 250 MG tablet Take 500  mg by mouth daily.     Marland Kitchen doxycycline (VIBRAMYCIN) 100 MG capsule Take 100 mg by mouth 2 (two) times daily.     No current facility-administered medications for this visit.   Allergies:  Other and Oxycodone   Social History: The patient  reports that she quit smoking about 16 months ago. Her smoking use included cigarettes. She has a 55.00 pack-year smoking history. She has never used smokeless tobacco. She reports current alcohol use. She reports that she does not use drugs.   Family History: The patient's family history was reviewed and is noncontributory at present.  ROS: History of lower leg swelling, reportedly improved on low-dose diuretic and with elevating the feet.  Physical Exam: VS:  BP (!) 142/60   Pulse 95   Ht 5\' 8"  (1.727 m)   Wt 101 lb 6.4 oz (46 kg)   SpO2 97%   BMI 15.42 kg/m , BMI Body mass index is 15.42 kg/m.  Wt Readings from Last 3 Encounters:  09/22/20 101 lb 6.4 oz (46 kg)  09/13/20 110 lb (49.9 kg)  08/31/20 110 lb 6.4 oz (50.1 kg)    General: Frail-appearing elderly woman in no distress wearing oxygen via nasal cannula. HEENT: Conjunctiva and lids normal, wearing a mask. Neck: Supple, no elevated JVP. Lungs: No crackles or wheezing evident. Cardiac: Regular rate and rhythm with ectopy, no S3, soft systolic murmur. Abdomen: Soft, nontender, bowel sounds present. Extremities: No pitting edema. Skin: Warm and dry. Musculoskeletal: Kyphosis. Neuropsychiatric: Alert and oriented x3, affect grossly appropriate.  ECG:  An ECG dated 09/13/2020 was personally reviewed today and demonstrated:  Sinus rhythm with low voltage, nonspecific T wave changes.  Recent Labwork: 08/31/2020: ALT 8; AST 34 09/13/2020: B Natriuretic Peptide 146.0; BUN 21; Creatinine, Ser 1.06; Hemoglobin 9.8; Platelets 265; Potassium 4.9; Sodium 136   Other Studies Reviewed Today:  Chest CTA 09/13/2020: IMPRESSION: 1. No CT evidence of pulmonary embolism. 2. Trace bilateral pleural  effusions with bibasilar subsegmental consolidative changes, left greater right may represent atelectasis or infiltrate. Clinical correlation is recommended. 3. Mild cardiomegaly with small pericardial effusion. 4. Large hiatal hernia. 5. Splenomegaly. 6. Age indeterminate T5 compression fracture. 7. Aortic Atherosclerosis (ICD10-I70.0) and Emphysema (ICD10-J43.9).  Assessment and Plan:  Patient referred with incidental finding of a small pericardial effusion by recent chest CT.  She has at baseline description of COPD with chronic hypoxic respiratory failure requiring supplemental oxygen at home, Pulmonary referral is pending per chart review.  She describes ambulatory hypoxemia.  No pulmonary embolus by chest CTA.  Trace pleural effusions also noted.  Last assessment of LVEF was in 2020.  We will obtain an updated echocardiogram to not only follow-up on the pericardial effusion, but also reassess LV and RV function as well as PASP.  Medication Adjustments/Labs and Tests Ordered: Current medicines are reviewed  at length with the patient today.  Concerns regarding medicines are outlined above.   Tests Ordered: Orders Placed This Encounter  Procedures  . ECHOCARDIOGRAM COMPLETE    Medication Changes: No orders of the defined types were placed in this encounter.   Disposition:  Follow up test results.  Signed, Satira Sark, MD, [facility] 09/22/2020 3:17 PM    [facility] [address], [place], [place] [address] Phone: [phone]; Fax: [phone]

## 2020-09-24 ENCOUNTER — Other Ambulatory Visit: Payer: Self-pay | Admitting: Family Medicine

## 2020-09-27 ENCOUNTER — Ambulatory Visit (HOSPITAL_COMMUNITY)
Admission: RE | Admit: 2020-09-27 | Discharge: 2020-09-27 | Disposition: A | Discharge status: Home / Self Care | Payer: Medicare HMO | Source: Ambulatory Visit | Attending: Cardiology | Admitting: Cardiology

## 2020-09-27 ENCOUNTER — Other Ambulatory Visit: Payer: Self-pay

## 2020-09-27 DIAGNOSIS — I3139 Other pericardial effusion (noninflammatory): Secondary | ICD-10-CM

## 2020-09-27 DIAGNOSIS — I313 Pericardial effusion (noninflammatory): Secondary | ICD-10-CM | POA: Insufficient documentation

## 2020-09-27 LAB — ECHOCARDIOGRAM COMPLETE
AR max vel: 1.99 cm2
AV Area VTI: 2.37 cm2
AV Area mean vel: 2.24 cm2
AV Mean grad: 2.2 mmHg
AV Peak grad: 5.4 mmHg
Ao pk vel: 1.16 m/s
Area-P 1/2: 3.12 cm2
S' Lateral: 2.1 cm

## 2020-09-27 NOTE — Progress Notes (Signed)
*  PRELIMINARY RESULTS* Echocardiogram 2D Echocardiogram has been performed.  Taylor Cannon 09/27/2020, 4:12 PM

## 2020-09-28 ENCOUNTER — Other Ambulatory Visit: Payer: Self-pay | Admitting: Family Medicine

## 2020-09-28 ENCOUNTER — Other Ambulatory Visit: Payer: Self-pay

## 2020-09-28 ENCOUNTER — Encounter: Payer: Self-pay | Admitting: Nurse Practitioner

## 2020-09-28 ENCOUNTER — Ambulatory Visit (INDEPENDENT_AMBULATORY_CARE_PROVIDER_SITE_OTHER): Payer: Medicare HMO | Admitting: Nurse Practitioner

## 2020-09-28 DIAGNOSIS — G4701 Insomnia due to medical condition: Secondary | ICD-10-CM

## 2020-09-28 DIAGNOSIS — E43 Unspecified severe protein-calorie malnutrition: Secondary | ICD-10-CM

## 2020-09-28 DIAGNOSIS — J439 Emphysema, unspecified: Secondary | ICD-10-CM | POA: Diagnosis not present

## 2020-09-28 MED ORDER — TRELEGY ELLIPTA 100-62.5-25 MCG/INH IN AEPB
1.0000 | INHALATION_SPRAY | Freq: Every day | RESPIRATORY_TRACT | 3 refills | Status: DC
Start: 2020-09-28 — End: 2020-10-03

## 2020-09-28 NOTE — Progress Notes (Signed)
Established Patient Office Visit  Subjective:  Patient ID: Taylor Cannon, female    DOB: April 21, 1934  Age: 87 y.o. MRN: [id number]  CC:  Chief Complaint  Patient presents with  . Follow-up    HPI Taylor Cannon presents for follow-up for  1. Moderate protein-calorie malnutrition ([place])   2. Pulmonary emphysema, unspecified emphysema type (HCC)   3. Leg swelling   4. Essential hypertension   5. Low oxygen saturation   6. Insomnia due to medical condition    She was seen in the ED on 09/13/20 for COPD exacerbation. She was discharged with a trial of steroids and antibiotics. She was referred to cardiology for small pericardial effusion.  Sometimes her HR is in the 100-110s when she is walking.  She states her breathing is about like usual. She states her O2 sats drop when she is walking around.   Past Medical History:  Diagnosis Date  . Anxiety   . Benign paroxysmal positional vertigo   . COPD (chronic obstructive pulmonary disease) ([place])   . Depression   . Esophageal stricture   . Fall   . GERD (gastroesophageal reflux disease)   . Gout   . Intertrochanteric fracture of left femur ([place]) 09/18/2015  . Malnutrition ([place])   . On home oxygen therapy   . S/P ORIF (open reduction internal fixation) fracture left hip 09/18/15 IM nail   . TIA (transient ischemic attack)     Past Surgical History:  Procedure Laterality Date  . APPENDECTOMY    . BALLOON DILATION N/A 07/24/2013   Procedure: BALLOON DILATION to 13.57mm;  Surgeon: Rogene Houston, MD;  Location: [facility];  Service: Endoscopy;  Laterality: N/A;  . BALLOON DILATION N/A 11/11/2014   Procedure: BALLOON DILATION;  Surgeon: Rogene Houston, MD;  Location: [facility];  Service: Endoscopy;  Laterality: N/A;  . CARDIAC ELECTROPHYSIOLOGY STUDY AND ABLATION    . CATARACT EXTRACTION Bilateral   . CERVICAL CONE BIOPSY    . ESOPHAGEAL DILATION N/A 11/30/2014   Procedure: ESOPHAGEAL DILATION;  Surgeon: Rogene Houston, MD;   Location: [facility];  Service: Endoscopy;  Laterality: N/A;  Balloon, 15, 16.5  . ESOPHAGEAL DILATION N/A 06/03/2015   Procedure: ESOPHAGEAL DILATION WITH 16.5 MM BALLOON ;  Surgeon: Rogene Houston, MD;  Location: [facility];  Service: Endoscopy;  Laterality: N/A;  . ESOPHAGEAL DILATION N/A 09/09/2015   Procedure: ESOPHAGEAL DILATION;  Surgeon: Rogene Houston, MD;  [facility];  Service: Endoscopy;  Laterality: N/A;  . ESOPHAGEAL DILATION N/A 05/04/2016   Procedure: ESOPHAGEAL DILATION;  Surgeon: Rogene Houston, MD;  [facility];  Service: Endoscopy;  Laterality: N/A;  . ESOPHAGEAL DILATION N/A 06/22/2016   Procedure: ESOPHAGEAL DILATION;  Surgeon: Rogene Houston, MD;  [facility];  Service: Endoscopy;  Laterality: N/A;  . ESOPHAGEAL DILATION N/A 05/21/2019   Procedure: ESOPHAGEAL DILATION;  Surgeon: Rogene Houston, MD;  [facility];  Service: Endoscopy;  Laterality: N/A;  . ESOPHAGEAL DILATION N/A 06/19/2019   Procedure: ESOPHAGEAL DILATION;  Surgeon: Rogene Houston, MD;  [facility];  Service: Endoscopy;  Laterality: N/A;  . ESOPHAGOGASTRODUODENOSCOPY N/A 11/11/2014   Procedure: ESOPHAGOGASTRODUODENOSCOPY (EGD);  Surgeon: Rogene Houston, MD;  [facility];  Service: Endoscopy;  Laterality: N/A;  125-rescheduled 11/11/14 @ 10:30am Ann notified pt  . ESOPHAGOGASTRODUODENOSCOPY (EGD) WITH ESOPHAGEAL DILATION N/A 08/20/2013   Procedure: ESOPHAGOGASTRODUODENOSCOPY (EGD) WITH ESOPHAGEAL DILATION;  Surgeon: Bernadene Person  Cline Crock, MD;  [facility];  Service: Endoscopy;  Laterality: N/A;  345-moved to 730 Ann notified pt  . ESOPHAGOGASTRODUODENOSCOPY (EGD) WITH PROPOFOL N/A 07/24/2013   Procedure: ESOPHAGOGASTRODUODENOSCOPY (EGD) WITH PROPOFOL UNDER FLUOROSCOPY;  Surgeon: Malissa Hippo, MD;  Location: [facility];  Service: Endoscopy;  Laterality: N/A;  . ESOPHAGOGASTRODUODENOSCOPY (EGD) WITH PROPOFOL N/A 11/30/2014   Procedure:  ESOPHAGOGASTRODUODENOSCOPY (EGD) WITH PROPOFOL;  Surgeon: Malissa Hippo, MD;  Location: [facility];  Service: Endoscopy;  Laterality: N/A;  fluoro not needed  . ESOPHAGOGASTRODUODENOSCOPY (EGD) WITH PROPOFOL N/A 06/03/2015   Procedure: ESOPHAGOGASTRODUODENOSCOPY (EGD) WITH PROPOFOL; HIATUS AT 35 CM AND GE JUNCTION 40 CM;  Surgeon: Malissa Hippo, MD;  Location: [facility];  Service: Endoscopy;  Laterality: N/A;  . ESOPHAGOGASTRODUODENOSCOPY (EGD) WITH PROPOFOL N/A 09/09/2015   Procedure: ESOPHAGOGASTRODUODENOSCOPY (EGD) WITH PROPOFOL;  Surgeon: Malissa Hippo, MD;  [facility];  Service: Endoscopy;  Laterality: N/A;  8:15  . ESOPHAGOGASTRODUODENOSCOPY (EGD) WITH PROPOFOL N/A 05/04/2016   Procedure: ESOPHAGOGASTRODUODENOSCOPY (EGD) WITH PROPOFOL;  Surgeon: Malissa Hippo, MD;  [facility];  Service: Endoscopy;  Laterality: N/A;  9:30  . ESOPHAGOGASTRODUODENOSCOPY (EGD) WITH PROPOFOL N/A 06/22/2016   Procedure: ESOPHAGOGASTRODUODENOSCOPY (EGD) WITH PROPOFOL;  Surgeon: Malissa Hippo, MD;  [facility];  Service: Endoscopy;  Laterality: N/A;  11:20  . ESOPHAGOGASTRODUODENOSCOPY (EGD) WITH PROPOFOL N/A 05/21/2019   Procedure: ESOPHAGOGASTRODUODENOSCOPY (EGD) WITH PROPOFOL;  Surgeon: Malissa Hippo, MD;  [facility];  Service: Endoscopy;  Laterality: N/A;  2:30pm  . ESOPHAGOGASTRODUODENOSCOPY (EGD) WITH PROPOFOL N/A 06/19/2019   Procedure: ESOPHAGOGASTRODUODENOSCOPY (EGD) WITH PROPOFOL;  Surgeon: Malissa Hippo, MD;  [facility];  Service: Endoscopy;  Laterality: N/A;  220pm  . INTRAMEDULLARY (IM) NAIL INTERTROCHANTERIC Left 09/18/2015   Procedure: INTRAMEDULLARY (IM) NAIL INTERTROCHANTRIC;  Surgeon: Vickki Hearing, MD;  Location: [facility];  Service: Orthopedics;  Laterality: Left;  . TUBAL LIGATION      History reviewed. No pertinent family history.  Social History   Socioeconomic History  . Marital status: Divorced    Spouse name: Not on  file  . Number of children: 3  . Years of education: Not on file  . Highest education level: 10th grade  Occupational History  . Not on file  Tobacco Use  . Smoking status: Former Smoker    Packs/day: 1.00    Years: 55.00    Pack years: 55.00    Types: Cigarettes    Quit date: 05/12/2019    Years since quitting: 1.3  . Smokeless tobacco: Never Used  . Tobacco comment: quit smoking since 1st of July.  Smoked all her life.   Vaping Use  . Vaping Use: Never used  Substance and Sexual Activity  . Alcohol use: Yes    Comment: 2 shots at night  . Drug use: No  . Sexual activity: Not on file  Other Topics Concern  . Not on file  Social History Narrative   Lives with daughter   Social Determinants of Health   Financial Resource Strain: Not on file  Food Insecurity: Not on file  Transportation Needs: Not on file  Physical Activity: Not on file  Stress: Not on file  Social Connections: Not on file  Intimate Partner Violence: Not on file    Outpatient Medications Prior to Visit  Medication Sig Dispense Refill  . albuterol (VENTOLIN HFA) 108 (90 Base) MCG/ACT inhaler Inhale 2 puffs into the lungs every 6 (six)  hours as needed for wheezing or shortness of breath. 6.7 g 3  . allopurinol (ZYLOPRIM) 100 MG tablet TAKE 1 TABLET BY MOUTH EVERY DAY 90 tablet 1  . doxycycline (VIBRAMYCIN) 100 MG capsule Take 100 mg by mouth 2 (two) times daily.    . hydrochlorothiazide (HYDRODIURIL) 12.5 MG tablet Take 1 tablet (12.5 mg total) by mouth every Monday, Wednesday, and Friday. 30 tablet 1  . LORazepam (ATIVAN) 0.5 MG tablet Take 1 tablet (0.5 mg total) by mouth 2 (two) times daily as needed. for anxiety 30 tablet 0  . metoprolol tartrate (LOPRESSOR) 25 MG tablet TAKE 1 TABLET BY MOUTH TWICE A DAY 60 tablet 1  . mirtazapine (REMERON) 45 MG tablet TAKE 1 TABLET BY MOUTH AT BEDTIME 90 tablet 1  . Multiple Vitamin (MULTIVITAMIN PO) Take 1 tablet by mouth daily.    Marland Kitchen nystatin (MYCOSTATIN) 100000  UNIT/ML suspension Take 5 mLs (500,000 Units total) by mouth in the morning, at noon, and at bedtime. 60 mL 0  . OXYGEN Inhale 2 L into the lungs continuous.     . pantoprazole (PROTONIX) 40 MG tablet TAKE 1 TABLET (40 MG TOTAL) BY MOUTH 2 (TWO) TIMES DAILY BEFORE A MEAL 180 tablet 1  . vitamin C (ASCORBIC ACID) 250 MG tablet Take 500 mg by mouth daily.     Marland Kitchen BREO ELLIPTA 200-25 MCG/INH AEPB Inhale 1 puff into the lungs 2 (two) times daily. 28 each 3   No facility-administered medications prior to visit.    Allergies  Allergen Reactions  . Other Nausea Only    Steroids  . Oxycodone Nausea And Vomiting    ROS Review of Systems    Objective:    Physical Exam  BP 128/72 (BP Location: Right Arm, Patient Position: Sitting, Cuff Size: Normal)   Pulse 72   Temp 98 F (36.7 C) (Temporal)   Ht $R'5\' 8"'sk$  (1.727 m)   Wt 105 lb (47.6 kg)   SpO2 98%   BMI 15.97 kg/m  Wt Readings from Last 3 Encounters:  09/28/20 105 lb (47.6 kg)  09/22/20 101 lb 6.4 oz (46 kg)  09/13/20 110 lb (49.9 kg)     Health Maintenance Due  Topic Date Due  . TETANUS/TDAP  Never done  . PNA vac Low Risk Adult (1 of 2 - PCV13) Never done  . COVID-19 Vaccine (3 - Booster for Moderna series) 05/06/2020    There are no preventive care reminders to display for this patient.  Lab Results  Component Value Date   TSH 2.482 05/12/2019   Lab Results  Component Value Date   WBC 5.5 09/13/2020   HGB 9.8 (L) 09/13/2020   HCT 32.2 (L) 09/13/2020   MCV 100.9 (H) 09/13/2020   PLT 265 09/13/2020   Lab Results  Component Value Date   NA 136 09/13/2020   K 4.9 09/13/2020   CO2 30 09/13/2020   GLUCOSE 103 (H) 09/13/2020   BUN 21 09/13/2020   CREATININE 1.06 (H) 09/13/2020   BILITOT 0.3 08/31/2020   ALKPHOS 72 08/31/2020   AST 34 08/31/2020   ALT 8 08/31/2020   PROT 5.9 (L) 08/31/2020   ALBUMIN 3.5 (L) 08/31/2020   CALCIUM 8.2 (L) 09/13/2020   ANIONGAP 7 09/13/2020   No results found for: CHOL No  results found for: HDL No results found for: LDLCALC No results found for: TRIG No results found for: CHOLHDL No results found for: HGBA1C    Assessment & Plan:   Problem List  Items Addressed This Visit      Respiratory   Chronic obstructive pulmonary disease ([place])    -significant emphysema -has upcoming pulmonology appointment -she is a oxygen dependent COPD patient -currently taking BREO BID -STOP Breo -Rx. Trelegy      Relevant Medications   Fluticasone-Umeclidin-Vilant (TRELEGY ELLIPTA) 100-62.5-25 MCG/INH AEPB   Other Relevant Orders   CBC with Differential/Platelet   CMP14+EGFR     Other   Severe protein-calorie malnutrition (HCC)    Wt Readings from Last 3 Encounters:  09/28/20 105 lb (47.6 kg)  09/22/20 101 lb 6.4 oz (46 kg)  09/13/20 110 lb (49.9 kg)   -Weight improved since 6 days ago -will recheck labs today      Relevant Orders   CBC with Differential/Platelet   CMP14+EGFR      Meds ordered this encounter  Medications  . Fluticasone-Umeclidin-Vilant (TRELEGY ELLIPTA) 100-62.5-25 MCG/INH AEPB    Sig: Inhale 1 puff into the lungs daily.    Dispense:  1 each    Refill:  3    Follow-up: Return in about 3 months (around 12/27/2020) for Lab and COPD follow-up.    Noreene Larsson, NP

## 2020-09-28 NOTE — Assessment & Plan Note (Signed)
Wt Readings from Last 3 Encounters:  09/28/20 105 lb (47.6 kg)  09/22/20 101 lb 6.4 oz (46 kg)  09/13/20 110 lb (49.9 kg)   -Weight improved since 6 days ago -will recheck labs today

## 2020-09-28 NOTE — Patient Instructions (Signed)
We started triple therapy for COPD with Trelegy. It has a steroid component in it like Breo, so please rinse your mouth after using this medication to prevent thrush.

## 2020-09-28 NOTE — Assessment & Plan Note (Signed)
-  significant emphysema -has upcoming pulmonology appointment -she is a oxygen dependent COPD patient -currently taking BREO BID -STOP Breo -Rx. Trelegy

## 2020-09-30 ENCOUNTER — Other Ambulatory Visit: Payer: Self-pay | Admitting: Family Medicine

## 2020-09-30 DIAGNOSIS — G4701 Insomnia due to medical condition: Secondary | ICD-10-CM

## 2020-10-03 ENCOUNTER — Other Ambulatory Visit: Payer: Self-pay

## 2020-10-03 ENCOUNTER — Ambulatory Visit: Payer: Medicare HMO | Admitting: Pulmonary Disease

## 2020-10-03 ENCOUNTER — Other Ambulatory Visit: Payer: Self-pay | Admitting: Nurse Practitioner

## 2020-10-03 ENCOUNTER — Encounter: Payer: Self-pay | Admitting: Pulmonary Disease

## 2020-10-03 ENCOUNTER — Telehealth: Payer: Self-pay | Admitting: Pulmonary Disease

## 2020-10-03 VITALS — BP 100/54 | HR 83 | Temp 97.1°F | Ht 68.0 in | Wt 105.8 lb

## 2020-10-03 DIAGNOSIS — Z7189 Other specified counseling: Secondary | ICD-10-CM

## 2020-10-03 DIAGNOSIS — R64 Cachexia: Secondary | ICD-10-CM | POA: Diagnosis not present

## 2020-10-03 DIAGNOSIS — J9611 Chronic respiratory failure with hypoxia: Secondary | ICD-10-CM

## 2020-10-03 DIAGNOSIS — G4701 Insomnia due to medical condition: Secondary | ICD-10-CM

## 2020-10-03 DIAGNOSIS — J449 Chronic obstructive pulmonary disease, unspecified: Secondary | ICD-10-CM | POA: Diagnosis not present

## 2020-10-03 MED ORDER — LORAZEPAM 0.5 MG PO TABS: 0.5000 mg | ORAL_TABLET | Freq: Two times a day (BID) | ORAL | 0 refills | Status: AC | PRN

## 2020-10-03 MED ORDER — IPRATROPIUM-ALBUTEROL 0.5-2.5 (3) MG/3ML IN SOLN: 3.0000 mL | RESPIRATORY_TRACT | 5 refills | Status: AC | PRN

## 2020-10-03 MED ORDER — IPRATROPIUM-ALBUTEROL 0.5-2.5 (3) MG/3ML IN SOLN: 3.0000 mL | Freq: Four times a day (QID) | RESPIRATORY_TRACT | Status: AC

## 2020-10-03 NOTE — Telephone Encounter (Signed)
I sent this in, but it was weird and didn't make me sign off on improvata. Can we check to make sure that the pharmacy got this?  It looks like it went through when I look at the chart.

## 2020-10-03 NOTE — Progress Notes (Signed)
[facility]  Chief Complaint  Patient presents with  . Consult    Shortness of breath with activity for past 6-8 months    Constitutional:  BP (!) 100/54 (BP Location: Left Arm, Cuff Size: Normal)   Pulse 83   Temp (!) 97.1 F (36.2 C) (Other (Comment)) Comment (Src): wrist  Ht 5\' 8"  (1.727 m)   Wt 105 lb 12.8 oz (48 kg)   SpO2 100% Comment: 2L O2  BMI 16.09 kg/m   Past Medical History:  Anxiety, Vertigo, Depression, Esophageal stricture, GERD, Gout, TIA  Past Surgical History:  She  has a past surgical history that includes Appendectomy; Cervical cone biopsy; Tubal ligation; Cardiac electrophysiology study and ablation; Cataract extraction (Bilateral); Esophagogastroduodenoscopy (egd) with propofol (N/A, 07/24/2013); Balloon dilation (N/A, 07/24/2013); Esophagogastroduodenoscopy (egd) with esophageal dilation (N/A, 08/20/2013); Esophagogastroduodenoscopy (N/A, 11/11/2014); Balloon dilation (N/A, 11/11/2014); Esophagogastroduodenoscopy (egd) with propofol (N/A, 11/30/2014); Esophageal dilation (N/A, 11/30/2014); Esophagogastroduodenoscopy (egd) with propofol (N/A, 06/03/2015); Esophageal dilation (N/A, 06/03/2015); Esophagogastroduodenoscopy (egd) with propofol (N/A, 09/09/2015); Esophageal dilation (N/A, 09/09/2015); Intramedullary (im) nail intertrochanteric (Left, 09/18/2015); Esophagogastroduodenoscopy (egd) with propofol (N/A, 05/04/2016); Esophageal dilation (N/A, 05/04/2016); Esophagogastroduodenoscopy (egd) with propofol (N/A, 06/22/2016); Esophageal dilation (N/A, 06/22/2016); Esophagogastroduodenoscopy (egd) with propofol (N/A, 05/21/2019); Esophageal dilation (N/A, 05/21/2019); Esophagogastroduodenoscopy (egd) with propofol (N/A, 06/19/2019); and Esophageal dilation (N/A, 06/19/2019).  Brief Summary:  Taylor Cannon is a 87 y.o. female former smoker with COPD and chronic respiratory failure with hypoxia.       Subjective:   She is here with her  daughter.  She quit smoking a couple of years ago.  She is not very active.  She gets winded quickly with minimal activity.  She uses 2 liters oxygen at rest and SpO2 stays above 90%.  When she walks short distances on 2 liters her oxygen can drop into the 70s.  She isn't have much cough, chest congestion, or sputum.  She was having leg swelling a few ago but this has improved.  Not history of thromboembolic disease.  She doesn't feel she can take a deep enough breath to get breo into her lungs.  She doesn't have a nebulizer machine at home.  She doesn't have much of an appetite and has trouble keeping her weight up.  Physical Exam:   Appearance - thin, wearing oxygen  ENMT - no sinus tenderness, no oral exudate, no LAN, Mallampati 2 airway, no stridor  Respiratory - decreased breath sounds bilaterally, no wheezing or rales  CV - s1s2 regular rate and rhythm, no murmurs  Ext - no clubbing, no edema, decreased muscle bulk  Skin - no rashes  Psych - normal mood and affect   Pulmonary testing:    Chest Imaging:   CT angio chest 09/13/20 >> moderate centrilobular emphysema, splenomegaly  Cardiac Tests:   Echo 09/27/20 >> EF 70 to 75%, grade 1 DD, mod LA dilation, mild AR  Social History:  She  reports that she quit smoking about 2 years ago. Her smoking use included cigarettes. She has a 55.00 pack-year smoking history. She has never used smokeless tobacco. She reports current alcohol use. She reports that she does not use drugs.  Family History:  Significant for emphysema.     Assessment/Plan:   Severe COPD with emphysema. - reviewed natural progression of disease process and explained she has very advanced disease - I don't think she can effectively actuate breo and trelegy, and have discontinued these - will arrange for home nebulizer and  have her using duoneb q4h to q6h prn - don't think she needs an ICS at this time since recent CBC with diff showed that her eosinophil  level wasn't elevated - don't think there is any utility at this point in obtaining pulmonary function testing and don't think she would be able to adequately perform test measures - discussed option of pulmonary rehab, but she would like to defer this  Chronic respiratory failure with hypoxia related to COPD with emphysema. - continue 2 liters oxygen at rest - advised they can increase oxygen with activity as needed to maintain SpO2 > 90% - don't think a POC or pulsed oxygen would be sufficient for her needs at this time - if her oxygen requirements increase, then could set up a pendant cannula as an oxygen reservoir  Cachexia from COPD. - explained the importance of maintaining her caloric and protein intake  Goals of care. - she is DNR/DNI - discussed option of palliative care referral, and explained different roles for palliative care versus hospice and this can be a spectrum that she transition  - they are open to learning more about palliative care; referral placed  Pericardial effusion. - followed by Dr. Simona Huh with [facility]  Time Spent Involved in Patient Care on Day of Examination:  67 minutes  Follow up:  Patient Instructions  Stop breo and do not start using trelegy  Will arrange for home nebulizer machine  Duoneb one vial nebulized every 4 to 6 hours to help with cough, wheezing, chest congestion, or shortness of breath  Will arrange for spacer device to use with albuterol  Goal oxygen level is above 90%; you can turn your oxygen level up higher before doing activity  Will arrange for referral to palliative care  Follow up in 3 months   Medication List:   Allergies as of 10/03/2020      Reactions   Other Nausea Only   Steroids   Oxycodone Nausea And Vomiting      Medication List       Accurate as of October 03, 2020 10:16 AM. If you have any questions, ask your nurse or doctor.        STOP taking these medications   BREO ELLIPTA  IN Stopped by: Coralyn Helling, MD   doxycycline 100 MG capsule Commonly known as: VIBRAMYCIN Stopped by: Coralyn Helling, MD   nystatin 100000 UNIT/ML suspension Commonly known as: MYCOSTATIN Stopped by: Coralyn Helling, MD   Trelegy Ellipta 100-62.5-25 MCG/INH Aepb Generic drug: Fluticasone-Umeclidin-Vilant Stopped by: Coralyn Helling, MD     TAKE these medications   albuterol 108 (90 Base) MCG/ACT inhaler Commonly known as: VENTOLIN HFA Inhale 2 puffs into the lungs every 6 (six) hours as needed for wheezing or shortness of breath.   allopurinol 100 MG tablet Commonly known as: ZYLOPRIM TAKE 1 TABLET BY MOUTH EVERY DAY   hydrochlorothiazide 12.5 MG tablet Commonly known as: HYDRODIURIL Take 1 tablet (12.5 mg total) by mouth every Monday, Wednesday, and Friday.   ipratropium-albuterol 0.5-2.5 (3) MG/3ML Soln Commonly known as: DUONEB Take 3 mLs by nebulization every 4 (four) hours as needed. Started by: Coralyn Helling, MD   LORazepam 0.5 MG tablet Commonly known as: ATIVAN TAKE 1 TABLET (0.5 MG TOTAL) BY MOUTH 2 (TWO) TIMES DAILY AS NEEDED FOR ANXIETY   metoprolol tartrate 25 MG tablet Commonly known as: LOPRESSOR TAKE 1 TABLET BY MOUTH TWICE A DAY What changed:   how much to take  how to  take this  additional instructions   mirtazapine 45 MG tablet Commonly known as: REMERON TAKE 1 TABLET BY MOUTH AT BEDTIME   MULTIVITAMIN PO Take 1 tablet by mouth daily.   OXYGEN Inhale 2 L into the lungs continuous.   pantoprazole 40 MG tablet Commonly known as: PROTONIX TAKE 1 TABLET (40 MG TOTAL) BY MOUTH 2 (TWO) TIMES DAILY BEFORE A MEAL   vitamin C 250 MG tablet Commonly known as: ASCORBIC ACID Take 500 mg by mouth daily.       Signature:  Chesley Mires, MD [place] Pager - [phone] 10/03/2020, 10:16 AM

## 2020-10-03 NOTE — Telephone Encounter (Signed)
Nebulizer machine order was placed by Dr. Halford Chessman 10/03/20. Judeen Hammans, [facility] has received and faxed order to [facility].

## 2020-10-03 NOTE — Patient Instructions (Signed)
Stop breo and do not start using trelegy  Will arrange for home nebulizer machine  Duoneb one vial nebulized every 4 to 6 hours to help with cough, wheezing, chest congestion, or shortness of breath  Will arrange for spacer device to use with albuterol  Goal oxygen level is above 90%; you can turn your oxygen level up higher before doing activity  Will arrange for referral to palliative care  Follow up in 3 months

## 2020-10-03 NOTE — Telephone Encounter (Signed)
I don't see where a nebulizer order has been placed for this patient.

## 2020-10-04 ENCOUNTER — Telehealth: Payer: Self-pay | Admitting: Pulmonary Disease

## 2020-10-04 ENCOUNTER — Telehealth: Payer: Self-pay | Admitting: Nurse Practitioner

## 2020-10-04 NOTE — Telephone Encounter (Signed)
Order faxed to [facility].  Called pt's dtr and made her aware.  Nothing further needed.

## 2020-10-04 NOTE — Telephone Encounter (Signed)
Spoke with patient's daughter, Helene Shoe, regarding the Palliative referral/services and all questions were answered and she was in agreement with scheduling visit.  I have scheduled an In-person Consult for 10/13/20 @ 1 PM

## 2020-10-05 DIAGNOSIS — J9611 Chronic respiratory failure with hypoxia: Secondary | ICD-10-CM | POA: Diagnosis not present

## 2020-10-05 DIAGNOSIS — J449 Chronic obstructive pulmonary disease, unspecified: Secondary | ICD-10-CM | POA: Diagnosis not present

## 2020-10-13 ENCOUNTER — Other Ambulatory Visit: Payer: Self-pay

## 2020-10-13 ENCOUNTER — Other Ambulatory Visit: Payer: Medicare HMO | Admitting: Nurse Practitioner

## 2020-10-13 DIAGNOSIS — Z515 Encounter for palliative care: Secondary | ICD-10-CM | POA: Diagnosis not present

## 2020-10-13 DIAGNOSIS — E43 Unspecified severe protein-calorie malnutrition: Secondary | ICD-10-CM

## 2020-10-13 NOTE — Progress Notes (Signed)
[facility] Palliative Care Consult Note Telephone: [phone]  Fax: [phone]  PATIENT NAME: Taylor Cannon DOB: 17-Aug-1934 MRN: [id number]  PRIMARY CARE PROVIDER:   Perlie Mayo, NP  REFERRING PROVIDER:  Perlie Mayo, NP [address] [place],  [place] [address]   I was asked to see Ms. Dwan for Palliative care consult by Ms. Jerelene Redden NP for complex medical decision making  RESPONSIBLE PARTY:   Helene Shoe (daughter)  [phone]  Due to the COVID-19 crisis, this visit was done via telemedicine from my office and it was initiated and consent by this patient and or family  1. Advance Care Planning; DNR, will complete and place in vynca 2. Goals of Care: Goals include to maximize quality of life and symptom management. Our advance care planning conversation included a discussion about:     The value and importance of advance care planning   Exploration of personal, cultural or spiritual beliefs that might influence medical decisions   Exploration of goals of care in the event of a sudden injury or illness   Identification and preparation of a healthcare agent   Review and updating or creation of an  advance directive document.   3. Palliative care encounter; Palliative care encounter; Palliative medicine team will continue to support patient, patient's family, and medical team. Visit consisted of counseling and education dealing with the complex and emotionally intense issues of symptom management and palliative care in the setting of serious and potentially life-threatening illness   4. f/u 1 week for ongoing monitoring chronic disease progression, ongoing discussions complex medical decision making  I spent 50 minutes providing this consultation,  from 1:00pmto 1:50pm. More than 50% of the time in this consultation was spent coordinating communication.   HISTORY OF PRESENT ILLNESS:  TOPEKA GIAMMONA is a 87 y.o. year old female with  multiple medical problems including Severe COPD, O2 dependence, chronic respiratory failure with hypoxia, h/o TIA, gerd, vertigo, esophageal stricture, anxiety, depression. I called Janelle, Ms. Bryner his daughter for Telemedicine telephonic as video not available for initial Palliative care visit. We talked about purpose of Palliative care. Jeanette in agreement. We reviewed past medical history about 5 years ago masseuse was residing independently. Since that time [name] has moved in with Ms. Quaranta as she has overall functionally declined. We talked about COPD, chronic respiratory failure with hypoxia, O2 dependence  progression with realistic expectations. We talked about Ms. Butson current functional level she is able to take a few steps with a walker but becomes very short of breath and her heart races. We talked about shortness of breath, nebulizer treatments. [name] endorses she is giving Ms. Giovanetti two treatments a day but pulmonology shared that she can give up to four times a day. Janelle endorses Ms. Villarruel has to sit down in order to catch her breath. Janelle endorses they have been turning her oxygen level up to 4L/[place] with saturations drop to 75%. Janelle endorses they come back up to the 90s, they are able to turn down the oxygen back to 2L/[place] when she is resting. We talked about symptoms of pain with Ms. Sitzmann not experiencing currently. We talked about her appetite which is very poor. Janelle endorses Ms. Wolaver does not eat much throughout the day. We talked about overall decline, progression. We talked about Life review. We talked about family dynamics. [name] endorses she had several family members to pass with Hospice says she is familiar with their services.  We talked about Hospice benefit under Medicare program. We talked about with clinical presentation concern for overall decline, debility and that this could possibly be eligible for Hospice Services. Asked if Case can be reviewed by  Hospice physician. Janelle in agreement. We talked about Ms. Ciszek feeling about Hospice. [name] endorses she is very concerned how Ms. Brockel is going to feel about Hospice. Janelle endorses she would not be able to talk to Ms. Desanctis about Hospice. Janelle endorses Ms. Cavey said that everyone thinks that she is dying. Ms. Moxey does not think that she is dying. Ms. Steinhilber is very hard of hearing and is not able to talk on the phone. We talked about scheduling in person Palliative follow-up visit for further discussion of Hospice Services. Janelle in agreement, appointment scheduled. We talked about role of Palliative care and plan of care. We talked about caregiver stress and fatigue, coping strategies. We talked about disease progression with realistic expectations. Therapeutic listening and emotional support provided. Contact information provided. Questions answered  to satisfaction.  Palliative Care was asked to help address goals of care.   CODE STATUS: DNR  PPS: 40% HOSPICE ELIGIBILITY/DIAGNOSIS: TBD  PAST MEDICAL HISTORY:  Past Medical History:  Diagnosis Date  . Anxiety   . Benign paroxysmal positional vertigo   . COPD (chronic obstructive pulmonary disease) ([place])   . Depression   . Esophageal stricture   . Fall   . GERD (gastroesophageal reflux disease)   . Gout   . Intertrochanteric fracture of left femur ([place]) 09/18/2015  . Malnutrition ([place])   . On home oxygen therapy   . S/P ORIF (open reduction internal fixation) fracture left hip 09/18/15 IM nail   . TIA (transient ischemic attack)     SOCIAL HX:  Social History   Tobacco Use  . Smoking status: Former Smoker    Packs/day: 1.00    Years: 55.00    Pack years: 55.00    Types: Cigarettes    Quit date: 05/2018    Years since quitting: 2.4  . Smokeless tobacco: Never Used  . Tobacco comment:    Substance Use Topics  . Alcohol use: Yes    Comment: 2 shots at night    ALLERGIES:  Allergies  Allergen Reactions   . Other Nausea Only    Steroids  . Oxycodone Nausea And Vomiting     PERTINENT MEDICATIONS:  Outpatient Encounter Medications as of 10/13/2020  Medication Sig  . albuterol (VENTOLIN HFA) 108 (90 Base) MCG/ACT inhaler Inhale 2 puffs into the lungs every 6 (six) hours as needed for wheezing or shortness of breath.  . allopurinol (ZYLOPRIM) 100 MG tablet TAKE 1 TABLET BY MOUTH EVERY DAY  . hydrochlorothiazide (HYDRODIURIL) 12.5 MG tablet Take 1 tablet (12.5 mg total) by mouth every Monday, Wednesday, and Friday.  Marland Kitchen ipratropium-albuterol (DUONEB) 0.5-2.5 (3) MG/3ML SOLN Take 3 mLs by nebulization every 4 (four) hours as needed.  Marland Kitchen LORazepam (ATIVAN) 0.5 MG tablet Take 1 tablet (0.5 mg total) by mouth 2 (two) times daily as needed. for anxiety  . metoprolol tartrate (LOPRESSOR) 25 MG tablet TAKE 1 TABLET BY MOUTH TWICE A DAY (Patient taking differently: 12.5 mg 2 (two) times daily. Takes 1/2 pill BID to equal 12.5mg )  . mirtazapine (REMERON) 45 MG tablet TAKE 1 TABLET BY MOUTH AT BEDTIME  . Multiple Vitamin (MULTIVITAMIN PO) Take 1 tablet by mouth daily.  . OXYGEN Inhale 2 L into the lungs continuous.   . pantoprazole (PROTONIX) 40 MG tablet  TAKE 1 TABLET (40 MG TOTAL) BY MOUTH 2 (TWO) TIMES DAILY BEFORE A MEAL  . vitamin C (ASCORBIC ACID) 250 MG tablet Take 500 mg by mouth daily.    Facility-Administered Encounter Medications as of 10/13/2020  Medication  . ipratropium-albuterol (DUONEB) 0.5-2.5 (3) MG/3ML nebulizer solution 3 mL    PHYSICAL EXAM:   Deferred  Brinson Tozzi Z Jayleah Garbers, NP

## 2020-10-14 ENCOUNTER — Telehealth: Payer: Self-pay | Admitting: Nurse Practitioner

## 2020-10-14 NOTE — Telephone Encounter (Signed)
Called patient's daughter, Leeanne Rio, to offer to schedule a sooner Palliative f/u visit - no answer, left message requesting a return call to schedule visit.  Left my name and call back number

## 2020-10-17 ENCOUNTER — Other Ambulatory Visit: Payer: Self-pay

## 2020-10-17 ENCOUNTER — Other Ambulatory Visit: Payer: Medicare HMO | Admitting: Nurse Practitioner

## 2020-10-17 DIAGNOSIS — J449 Chronic obstructive pulmonary disease, unspecified: Secondary | ICD-10-CM

## 2020-10-17 DIAGNOSIS — Z515 Encounter for palliative care: Secondary | ICD-10-CM | POA: Diagnosis not present

## 2020-10-17 DIAGNOSIS — E43 Unspecified severe protein-calorie malnutrition: Secondary | ICD-10-CM

## 2020-10-17 NOTE — Progress Notes (Signed)
[facility] Consult Note Telephone: [phone]  Fax: [phone]  PATIENT NAME: Taylor Cannon DOB: Mar 28, 1934 MRN: [id number]  PRIMARY CARE PROVIDER:   Perlie Mayo, NP  REFERRING PROVIDER:  Perlie Mayo, NP [place],  [place] [address]  RESPONSIBLE PARTY:   Taylor Cannon (daughter)  [phone]  1.Advance Care Planning;DNR, referral to Hospice  2. Goals of Care: Goals include to maximize quality of life and symptom management. Our advance care planning conversation included a discussion about:   The value and importance of advance care planning  Exploration of personal, cultural or spiritual beliefs that might influence medical decisions  Exploration of goals of care in the event of a sudden injury or illness  Identification and preparation of a healthcare agent  Review and updating or creation of anadvance directive document.  3.Palliative care encounter; Palliative care encounter; Palliative medicine team will continue to support patient, patient's family, and medical team. Visit consisted of counseling and education dealing with the complex and emotionally intense issues of symptom management and palliative care in the setting of serious and potentially life-threatening illness  I spent 60 minutes providing this consultation,  from 10:30am to 11:30am. More than 50% of the time in this consultation was spent coordinating communication.   HISTORY OF PRESENT ILLNESS:  Taylor Cannon is a 87 y.o. year old female with multiple medical problems including Severe COPD, O2 dependence, chronic respiratory failure with hypoxia, h/o TIA, gerd, vertigo, esophageal stricture, anxiety, depression. I called Taylor Cannon, Miss Taylor Cannon's daughter to confirm palliative care follow-up visit and pivot screen which was negative. I visited Ms. Taylor Cannon with Taylor Cannon, son,  Taylor Cannon caregiver. Ms. Taylor Cannon and I talked about purpose of  Palliative care visit and in agreement. We talked about how Ms. Taylor Cannon was feeling today. Ms. Taylor Cannon endorses that she does not feel bad she just gets very short of breath when she tries to walk in her O2 saturation is dropped into the 70s. We talked about current treatments in place and interventions including oxygen which they do turn up to forty when she is getting up and moving around and do a nebulizer for which she uses to add a typically. We talked about Life review. We talked about past medical history in the same chronic disease of COPD, O2 dependence and tobacco abuse. We talked about Ms. Taylor Cannon raising four children on her own and also working. We revisited chronic disease progression of COPD and Stage, what that means. We talked about symptoms of weakness, fatigue, insomnia not sleeping. We talked about sleep patterns, sleep hygiene. Ms. Taylor Cannon does take an Ativan at bedtime and has for many years but doesn't think that that is making a difference. Ms. Taylor Cannon endorses she does on occasion take a melatonin but not consistently. Discuss to take a melatonin every night for 5 days to see if it does help. Ms. Taylor Cannon endorses she does nap during the day though. We talked about shortness of breath with exertion related to COPD also  the pathophysiology that is happening within her body and have the medications help. We talked about progression with realistic expectations as disease process worsens moving towards and part of her life. We talked about medical goals of care aggressive versus conservative versus comfort care. We talked about wishes not to return to the hospital and focus on comfort at home. We talked about Hospice benefit under Medicare program what would be provided and how that program worked.  We talked about role of Palliative care and plan of care. Case previously reviewed by Hospice Physicians in found eligible for services. Taylor Cannon and Ms. Taylor Cannon wish to proceed with Hospice Services. We  talked about quality of life in the setting of disease progression being symptomatic. We talked about process of Hospice once orders received Hospice will call to set up a visit. Taylor Cannon in agreement. Therapeutic listening and emotional support provided. Contact information. Questions answered to satisfaction  Palliative Care was asked to help to continue to address goals of care.   CODE STATUS: DNR  PPS: 40% HOSPICE ELIGIBILITY/DIAGNOSIS: TBD  PAST MEDICAL HISTORY:  Past Medical History:  Diagnosis Date  . Anxiety   . Benign paroxysmal positional vertigo   . COPD (chronic obstructive pulmonary disease) ([place])   . Depression   . Esophageal stricture   . Fall   . GERD (gastroesophageal reflux disease)   . Gout   . Intertrochanteric fracture of left femur ([place]) 09/18/2015  . Malnutrition ([place])   . On home oxygen therapy   . S/P ORIF (open reduction internal fixation) fracture left hip 09/18/15 IM nail   . TIA (transient ischemic attack)     SOCIAL HX:  Social History   Tobacco Use  . Smoking status: Former Smoker    Packs/day: 1.00    Years: 55.00    Pack years: 55.00    Types: Cigarettes    Quit date: 05/2018    Years since quitting: 2.4  . Smokeless tobacco: Never Used  . Tobacco comment:    Substance Use Topics  . Alcohol use: Yes    Comment: 2 shots at night    ALLERGIES:  Allergies  Allergen Reactions  . Other Nausea Only    Steroids  . Oxycodone Nausea And Vomiting     PERTINENT MEDICATIONS:  Outpatient Encounter Medications as of 10/17/2020  Medication Sig  . albuterol (VENTOLIN HFA) 108 (90 Base) MCG/ACT inhaler Inhale 2 puffs into the lungs every 6 (six) hours as needed for wheezing or shortness of breath.  . allopurinol (ZYLOPRIM) 100 MG tablet TAKE 1 TABLET BY MOUTH EVERY DAY  . hydrochlorothiazide (HYDRODIURIL) 12.5 MG tablet Take 1 tablet (12.5 mg total) by mouth every Monday, Wednesday, and Friday.  Marland Kitchen ipratropium-albuterol (DUONEB) 0.5-2.5 (3) MG/3ML  SOLN Take 3 mLs by nebulization every 4 (four) hours as needed.  Marland Kitchen LORazepam (ATIVAN) 0.5 MG tablet Take 1 tablet (0.5 mg total) by mouth 2 (two) times daily as needed. for anxiety  . metoprolol tartrate (LOPRESSOR) 25 MG tablet TAKE 1 TABLET BY MOUTH TWICE A DAY (Patient taking differently: 12.5 mg 2 (two) times daily. Takes 1/2 pill BID to equal 12.5mg )  . mirtazapine (REMERON) 45 MG tablet TAKE 1 TABLET BY MOUTH AT BEDTIME  . Multiple Vitamin (MULTIVITAMIN PO) Take 1 tablet by mouth daily.  . OXYGEN Inhale 2 L into the lungs continuous.   . pantoprazole (PROTONIX) 40 MG tablet TAKE 1 TABLET (40 MG TOTAL) BY MOUTH 2 (TWO) TIMES DAILY BEFORE A MEAL  . vitamin C (ASCORBIC ACID) 250 MG tablet Take 500 mg by mouth daily.    Facility-Administered Encounter Medications as of 10/17/2020  Medication  . ipratropium-albuterol (DUONEB) 0.5-2.5 (3) MG/3ML nebulizer solution 3 mL    PHYSICAL EXAM:   General: Chronically ill, pleasant  frail appearing, thin female Cardiovascular: regular rate and rhythm Pulmonary: clear, poor air movement, decrease bases Extremities: no edema, no joint deformities; muscle wasting Neurological: walks with walker few steps  Johnita Palleschi  Ihor Gully, NP

## 2020-11-18 ENCOUNTER — Encounter (HOSPITAL_COMMUNITY): Payer: Self-pay | Admitting: Emergency Medicine

## 2020-11-18 ENCOUNTER — Emergency Department (HOSPITAL_COMMUNITY)
Admission: EM | Admit: 2020-11-18 | Discharge: 2020-11-18 | Disposition: A | Discharge status: Home / Self Care | Payer: Medicare Other | Attending: Emergency Medicine | Admitting: Emergency Medicine

## 2020-11-18 ENCOUNTER — Other Ambulatory Visit: Payer: Self-pay

## 2020-11-18 ENCOUNTER — Emergency Department (HOSPITAL_COMMUNITY): Payer: Medicare Other

## 2020-11-18 DIAGNOSIS — I10 Essential (primary) hypertension: Secondary | ICD-10-CM | POA: Diagnosis not present

## 2020-11-18 DIAGNOSIS — J449 Chronic obstructive pulmonary disease, unspecified: Secondary | ICD-10-CM | POA: Diagnosis not present

## 2020-11-18 DIAGNOSIS — W1830XA Fall on same level, unspecified, initial encounter: Secondary | ICD-10-CM | POA: Diagnosis not present

## 2020-11-18 DIAGNOSIS — M25511 Pain in right shoulder: Secondary | ICD-10-CM | POA: Insufficient documentation

## 2020-11-18 DIAGNOSIS — S0003XA Contusion of scalp, initial encounter: Secondary | ICD-10-CM | POA: Diagnosis not present

## 2020-11-18 DIAGNOSIS — S42034A Nondisplaced fracture of lateral end of right clavicle, initial encounter for closed fracture: Secondary | ICD-10-CM

## 2020-11-18 DIAGNOSIS — S0990XA Unspecified injury of head, initial encounter: Secondary | ICD-10-CM | POA: Diagnosis present

## 2020-11-18 DIAGNOSIS — Z79899 Other long term (current) drug therapy: Secondary | ICD-10-CM | POA: Diagnosis not present

## 2020-11-18 DIAGNOSIS — Y9301 Activity, walking, marching and hiking: Secondary | ICD-10-CM | POA: Diagnosis not present

## 2020-11-18 DIAGNOSIS — S7011XA Contusion of right thigh, initial encounter: Secondary | ICD-10-CM | POA: Diagnosis not present

## 2020-11-18 DIAGNOSIS — Z23 Encounter for immunization: Secondary | ICD-10-CM | POA: Diagnosis not present

## 2020-11-18 DIAGNOSIS — S60511A Abrasion of right hand, initial encounter: Secondary | ICD-10-CM | POA: Diagnosis not present

## 2020-11-18 DIAGNOSIS — T07XXXA Unspecified multiple injuries, initial encounter: Secondary | ICD-10-CM

## 2020-11-18 DIAGNOSIS — S60811A Abrasion of right wrist, initial encounter: Secondary | ICD-10-CM | POA: Insufficient documentation

## 2020-11-18 DIAGNOSIS — Z87891 Personal history of nicotine dependence: Secondary | ICD-10-CM | POA: Diagnosis not present

## 2020-11-18 DIAGNOSIS — I517 Cardiomegaly: Secondary | ICD-10-CM | POA: Diagnosis not present

## 2020-11-18 DIAGNOSIS — Y92009 Unspecified place in unspecified non-institutional (private) residence as the place of occurrence of the external cause: Secondary | ICD-10-CM | POA: Insufficient documentation

## 2020-11-18 DIAGNOSIS — S0083XA Contusion of other part of head, initial encounter: Secondary | ICD-10-CM | POA: Diagnosis not present

## 2020-11-18 DIAGNOSIS — W19XXXA Unspecified fall, initial encounter: Secondary | ICD-10-CM

## 2020-11-18 DIAGNOSIS — Z043 Encounter for examination and observation following other accident: Secondary | ICD-10-CM | POA: Diagnosis not present

## 2020-11-18 MED ORDER — TETANUS-DIPHTH-ACELL PERTUSSIS 5-2.5-18.5 LF-MCG/0.5 IM SUSY
0.5000 mL | PREFILLED_SYRINGE | Freq: Once | INTRAMUSCULAR | Status: AC
  Administered 2020-11-18: 0.5 mL via INTRAMUSCULAR
  Filled 2020-11-18: qty 0.5

## 2020-11-18 NOTE — ED Notes (Signed)
Pt placed on purewick. Called out for restroom

## 2020-11-18 NOTE — ED Notes (Signed)
Pt asleep at this time

## 2020-11-18 NOTE — ED Provider Notes (Signed)
[facility] EMERGENCY DEPARTMENT Provider Note   CSN: [id number] Arrival date & time: 11/18/20  8921     History Chief Complaint  Patient presents with  . Fall    [place] Droll Dooly is a 87 y.o. female.  Presents after a fall at home. Attempted to walk, leg gave out and fell. Multiple bruises and abrasions, shoulder pain, R thigh pain.        Past Medical History:  Diagnosis Date  . Anxiety   . Benign paroxysmal positional vertigo   . COPD (chronic obstructive pulmonary disease) ([place])   . Depression   . Esophageal stricture   . Fall   . GERD (gastroesophageal reflux disease)   . Gout   . Intertrochanteric fracture of left femur ([place]) 09/18/2015  . Malnutrition ([place])   . On home oxygen therapy   . S/P ORIF (open reduction internal fixation) fracture left hip 09/18/15 IM nail   . TIA (transient ischemic attack)     Patient Active Problem List   Diagnosis Date Noted  . Impaired left ventricular relaxation 09/08/2020  . Essential hypertension 08/31/2020  . Low oxygen saturation 08/31/2020  . Hoarseness of voice 12/02/2019  . Leg swelling 12/02/2019  . Dysphagia 06/01/2019  . At maximum risk for fall 05/26/2019  . Anxiety 05/26/2019  . Insomnia due to medical condition 05/26/2019  . Severe protein-calorie malnutrition ([place]) 09/17/2015  . Chronic obstructive pulmonary disease ([place]) 09/16/2015  . Former cigarette smoker 09/16/2015  . GERD (gastroesophageal reflux disease) 09/16/2015    Past Surgical History:  Procedure Laterality Date  . APPENDECTOMY    . BALLOON DILATION N/A 07/24/2013   Procedure: BALLOON DILATION to 13.28mm;  Surgeon: Rogene Houston, MD;  Location: [facility] OR[facility];  Service: Endoscopy;  Laterality: N/A;  . BALLOON DILATION N/A 11/11/2014   Procedure: BALLOON DILATION;  Surgeon: Rogene Houston, MD;  Location: [facility] ENDO SUITE;  Service: Endoscopy;  Laterality: N/A;  . CARDIAC ELECTROPHYSIOLOGY STUDY AND ABLATION    . CATARACT EXTRACTION Bilateral   .  CERVICAL CONE BIOPSY    . ESOPHAGEAL DILATION N/A 11/30/2014   Procedure: ESOPHAGEAL DILATION;  Surgeon: Rogene Houston, MD;  Location: [facility];  Service: Endoscopy;  Laterality: N/A;  Balloon, 15, 16.5  . ESOPHAGEAL DILATION N/A 06/03/2015   Procedure: ESOPHAGEAL DILATION WITH 16.5 MM BALLOON ;  Surgeon: Rogene Houston, MD;  Location: [facility];  Service: Endoscopy;  Laterality: N/A;  . ESOPHAGEAL DILATION N/A 09/09/2015   Procedure: ESOPHAGEAL DILATION;  Surgeon: Rogene Houston, MD;  Location: [facility];  Service: Endoscopy;  Laterality: N/A;  . ESOPHAGEAL DILATION N/A 05/04/2016   Procedure: ESOPHAGEAL DILATION;  Surgeon: Rogene Houston, MD;  Location: [facility];  Service: Endoscopy;  Laterality: N/A;  . ESOPHAGEAL DILATION N/A 06/22/2016   Procedure: ESOPHAGEAL DILATION;  Surgeon: Rogene Houston, MD;  Location: [facility];  Service: Endoscopy;  Laterality: N/A;  . ESOPHAGEAL DILATION N/A 05/21/2019   Procedure: ESOPHAGEAL DILATION;  Surgeon: Rogene Houston, MD;  [facility]: [facility];  Service: Endoscopy;  Laterality: N/A;  . ESOPHAGEAL DILATION N/A 06/19/2019   Procedure: ESOPHAGEAL DILATION;  Surgeon: Rogene Houston, MD;  [facility];  Service: Endoscopy;  Laterality: N/A;  . ESOPHAGOGASTRODUODENOSCOPY N/A 11/11/2014   Procedure: ESOPHAGOGASTRODUODENOSCOPY (EGD);  Surgeon: Rogene Houston, MD;  Location: [facility];  Service: Endoscopy;  Laterality: N/A;  125-rescheduled 11/11/14 @ 10:30am Ann notified pt  . ESOPHAGOGASTRODUODENOSCOPY (EGD) WITH ESOPHAGEAL DILATION N/A  08/20/2013   Procedure: ESOPHAGOGASTRODUODENOSCOPY (EGD) WITH ESOPHAGEAL DILATION;  Surgeon: Rogene Houston, MD;  [facility];  Service: Endoscopy;  Laterality: N/A;  345-moved to 730 Ann notified pt  . ESOPHAGOGASTRODUODENOSCOPY (EGD) WITH PROPOFOL N/A 07/24/2013   Procedure: ESOPHAGOGASTRODUODENOSCOPY (EGD) WITH PROPOFOL UNDER FLUOROSCOPY;  Surgeon: Rogene Houston, MD;  Location: [facility]  [facility];  Service: Endoscopy;  Laterality: N/A;  . ESOPHAGOGASTRODUODENOSCOPY (EGD) WITH PROPOFOL N/A 11/30/2014   Procedure: ESOPHAGOGASTRODUODENOSCOPY (EGD) WITH PROPOFOL;  Surgeon: Rogene Houston, MD;  Location: [facility];  Service: Endoscopy;  Laterality: N/A;  fluoro not needed  . ESOPHAGOGASTRODUODENOSCOPY (EGD) WITH PROPOFOL N/A 06/03/2015   Procedure: ESOPHAGOGASTRODUODENOSCOPY (EGD) WITH PROPOFOL; HIATUS AT 35 CM AND GE JUNCTION 40 CM;  Surgeon: Rogene Houston, MD;  Location: [facility];  Service: Endoscopy;  Laterality: N/A;  . ESOPHAGOGASTRODUODENOSCOPY (EGD) WITH PROPOFOL N/A 09/09/2015   Procedure: ESOPHAGOGASTRODUODENOSCOPY (EGD) WITH PROPOFOL;  Surgeon: Rogene Houston, MD;  Location: [facility];  Service: Endoscopy;  Laterality: N/A;  8:15  . ESOPHAGOGASTRODUODENOSCOPY (EGD) WITH PROPOFOL N/A 05/04/2016   Procedure: ESOPHAGOGASTRODUODENOSCOPY (EGD) WITH PROPOFOL;  Surgeon: Rogene Houston, MD;  [facility];  Service: Endoscopy;  Laterality: N/A;  9:30  . ESOPHAGOGASTRODUODENOSCOPY (EGD) WITH PROPOFOL N/A 06/22/2016   Procedure: ESOPHAGOGASTRODUODENOSCOPY (EGD) WITH PROPOFOL;  Surgeon: Rogene Houston, MD;  [facility];  Service: Endoscopy;  Laterality: N/A;  11:20  . ESOPHAGOGASTRODUODENOSCOPY (EGD) WITH PROPOFOL N/A 05/21/2019   Procedure: ESOPHAGOGASTRODUODENOSCOPY (EGD) WITH PROPOFOL;  Surgeon: Rogene Houston, MD;  [facility];  Service: Endoscopy;  Laterality: N/A;  2:30pm  . ESOPHAGOGASTRODUODENOSCOPY (EGD) WITH PROPOFOL N/A 06/19/2019   Procedure: ESOPHAGOGASTRODUODENOSCOPY (EGD) WITH PROPOFOL;  Surgeon: Rogene Houston, MD;  [facility];  Service: Endoscopy;  Laterality: N/A;  220pm  . INTRAMEDULLARY (IM) NAIL INTERTROCHANTERIC Left 09/18/2015   Procedure: INTRAMEDULLARY (IM) NAIL INTERTROCHANTRIC;  Surgeon: Carole Civil, MD;  Location: [facility];  Service: Orthopedics;  Laterality: Left;  . TUBAL LIGATION       OB History   No  obstetric history on file.     No family history on file.  Social History   Tobacco Use  . Smoking status: Former Smoker    Packs/day: 1.00    Years: 55.00    Pack years: 55.00    Types: Cigarettes    Quit date: 05/2018    Years since quitting: 2.5  . Smokeless tobacco: Never Used  . Tobacco comment:    Vaping Use  . Vaping Use: Never used  Substance Use Topics  . Alcohol use: Yes    Comment: 2 shots at night  . Drug use: No    Home Medications Prior to Admission medications   Medication Sig Start Date End Date Taking? Authorizing Provider  albuterol (VENTOLIN HFA) 108 (90 Base) MCG/ACT inhaler Inhale 2 puffs into the lungs every 6 (six) hours as needed for wheezing or shortness of breath. 08/31/20   Perlie Mayo, NP  allopurinol (ZYLOPRIM) 100 MG tablet TAKE 1 TABLET BY MOUTH EVERY DAY 09/28/20   Perlie Mayo, NP  hydrochlorothiazide (HYDRODIURIL) 12.5 MG tablet Take 1 tablet (12.5 mg total) by mouth every Monday, Wednesday, and Friday. 08/31/20   Perlie Mayo, NP  ipratropium-albuterol (DUONEB) 0.5-2.5 (3) MG/3ML SOLN Take 3 mLs by nebulization every 4 (four) hours as needed. 10/03/20   Chesley Mires, MD  LORazepam (ATIVAN) 0.5 MG tablet Take 1 tablet (0.5 mg total) by mouth  2 (two) times daily as needed. for anxiety 10/03/20   Noreene Larsson, NP  metoprolol tartrate (LOPRESSOR) 25 MG tablet TAKE 1 TABLET BY MOUTH TWICE A DAY Patient taking differently: 12.5 mg 2 (two) times daily. Takes 1/2 pill BID to equal 12.5mg  09/28/20   Perlie Mayo, NP  mirtazapine (REMERON) 45 MG tablet TAKE 1 TABLET BY MOUTH AT BEDTIME 09/28/20   Perlie Mayo, NP  Multiple Vitamin (MULTIVITAMIN PO) Take 1 tablet by mouth daily.    [provider]  OXYGEN Inhale 2 L into the lungs continuous.     [provider]  pantoprazole (PROTONIX) 40 MG tablet TAKE 1 TABLET (40 MG TOTAL) BY MOUTH 2 (TWO) TIMES DAILY BEFORE A MEAL 06/20/20   Laurine Blazer B, PA-C  vitamin C  (ASCORBIC ACID) 250 MG tablet Take 500 mg by mouth daily.     [provider]    Allergies    Other and Oxycodone  Review of Systems   Review of Systems  Musculoskeletal: Positive for arthralgias.  Skin: Positive for wound.  Neurological: Positive for headaches.  All other systems reviewed and are negative.   Physical Exam Updated Vital Signs BP 120/62   Pulse 91   Wt 48 kg   SpO2 100%   BMI 16.09 kg/m   Physical Exam Vitals and nursing note reviewed.  Constitutional:      General: She is not in acute distress.    Appearance: Normal appearance. She is well-developed.  HENT:     Head: Normocephalic. Contusion (right forehead, right chin) present.     Jaw: There is normal jaw occlusion.      Right Ear: Hearing normal.     Left Ear: Hearing normal.     Nose: Nose normal.  Eyes:     Conjunctiva/sclera: Conjunctivae normal.     Pupils: Pupils are equal, round, and reactive to light.  Cardiovascular:     Rate and Rhythm: Regular rhythm.     Heart sounds: S1 normal and S2 normal. No murmur heard. No friction rub. No gallop.   Pulmonary:     Effort: Pulmonary effort is normal. No respiratory distress.     Breath sounds: Normal breath sounds.  Chest:     Chest wall: No tenderness.  Abdominal:     General: Bowel sounds are normal.     Palpations: Abdomen is soft.     Tenderness: There is no abdominal tenderness. There is no guarding or rebound. Negative signs include Murphy's sign and McBurney's sign.     Hernia: No hernia is present.  Musculoskeletal:        General: Normal range of motion.     Right shoulder: Swelling and tenderness present.     Cervical back: Normal range of motion and neck supple.  Skin:    General: Skin is warm and dry.     Findings: Abrasion (R thigh, R hand, R wrist) present. No rash.  Neurological:     Mental Status: She is alert and oriented to person, place, and time.     GCS: GCS eye subscore is 4. GCS verbal subscore is 5. GCS  motor subscore is 6.     Cranial Nerves: No cranial nerve deficit.     Sensory: No sensory deficit.     Coordination: Coordination normal.  Psychiatric:        Speech: Speech normal.        Behavior: Behavior normal.        Thought  Content: Thought content normal.     ED Results / Procedures / Treatments   Labs (all labs ordered are listed, but only abnormal results are displayed) Labs Reviewed - No data to display  EKG None  Radiology DG Chest 1 View  Result Date: 11/18/2020 CLINICAL DATA:  Fall EXAM: CHEST  1 VIEW COMPARISON:  09/13/2020, 04/12/2019 FINDINGS: Cardiomegaly with aortic atherosclerosis.mild diffuse reticular opacity likely due to chronic disease. No consolidation, pleural effusion, or pneumothorax. IMPRESSION: No active cardiopulmonary disease. Cardiomegaly. Electronically Signed   By: Donavan Foil M.D.   On: 11/18/2020 01:58   DG Shoulder Right  Result Date: 11/18/2020 CLINICAL DATA:  Fall EXAM: RIGHT SHOULDER - 2+ VIEW COMPARISON:  None. FINDINGS: Possible acute fracture at the distal right clavicle. No fracture or malalignment at the glenohumeral interval. IMPRESSION: Possible acute fracture at the distal right clavicle. Electronically Signed   By: Donavan Foil M.D.   On: 11/18/2020 01:56   DG Wrist Complete Right  Result Date: 11/18/2020 CLINICAL DATA:  Fall EXAM: RIGHT WRIST - COMPLETE 3+ VIEW COMPARISON:  None. FINDINGS: Bones appear osteopenic. No fracture or dislocation is evident. Advanced arthritis at the first [facility] joint. Widening of the scapholunate interval with proximal migration of the capitate. Triangular fibrocartilage calcification. No definite acute displaced fracture IMPRESSION: 1. Osteopenia. No definite acute osseous abnormality. 2. Features suggesting [facility] wrist. 3. Chondrocalcinosis. Advanced arthritis at the first [facility] joint. Electronically Signed   By: Donavan Foil M.D.   On: 11/18/2020 01:54   CT HEAD WO CONTRAST  Result Date:  11/18/2020 CLINICAL DATA:  [place] fell, bruising of forehead EXAM: CT HEAD WITHOUT CONTRAST TECHNIQUE: Contiguous axial images were obtained from the base of the skull through the vertex without intravenous contrast. COMPARISON:  02/28/2017 FINDINGS: Brain: Scattered hypodensities within the periventricular white matter and left basal ganglia are consistent with chronic small vessel ischemic change. No signs of acute infarct or hemorrhage. Lateral ventricles and midline structures are otherwise unremarkable. No acute extra-axial fluid collections. No mass effect. Vascular: No hyperdense vessel or unexpected calcification. Skull: Right frontal scalp hematoma. No underlying fracture. The remainder of the calvarium is unremarkable. Sinuses/Orbits: No acute finding. Other: None. IMPRESSION: 1. No acute intracranial process. 2. Small right frontal scalp hematoma. Electronically Signed   By: Randa Ngo M.D.   On: 11/18/2020 01:44   CT CERVICAL SPINE WO CONTRAST  Result Date: 11/18/2020 CLINICAL DATA:  [place], shoulder pain, scalp hematoma EXAM: CT CERVICAL SPINE WITHOUT CONTRAST TECHNIQUE: Multidetector CT imaging of the cervical spine was performed without intravenous contrast. Multiplanar CT image reconstructions were also generated. COMPARISON:  None. FINDINGS: Alignment: There is minimal anterolisthesis of C3 relative to C4, due to extensive facet hypertrophy and spondylosis. Otherwise alignment is anatomic. Skull base and vertebrae: No acute fracture. No primary bone lesion or focal pathologic process. Soft tissues and spinal canal: No prevertebral fluid or swelling. No visible canal hematoma. Disc levels: There is multilevel spondylosis most pronounced at C3-4, C4-5, and C5-6. Diffuse facet hypertrophic changes most pronounced on the left at C3-4. Left predominant neural foraminal narrowing at C3-4, with symmetrical neural foraminal encroachment at C4-5 and C5-6. Upper chest: Airway is patent. Severe  emphysematous changes are seen at the lung apices. Other: Reconstructed images demonstrate no additional findings. IMPRESSION: 1. No acute cervical spine fracture. 2. Multilevel spondylosis and facet hypertrophy, most pronounced from C3-4 through C5-6. 3.  Emphysema (ICD10-J43.9). Electronically Signed   By: Randa Ngo M.D.   On: 11/18/2020 01:50  DG Hand Complete Right  Result Date: 11/18/2020 CLINICAL DATA:  Fall EXAM: RIGHT HAND - COMPLETE 3+ VIEW COMPARISON:  None. FINDINGS: Bones appear osteopenic. No acute displaced fracture or malalignment is seen. Degenerative changes at the first [facility] joint. Joint space narrowing at the second and third MCP joints. Ulnar deviation of the digits most evident at the second and third MCP joints. IMPRESSION: Osteopenia. No definite acute osseous abnormality. Electronically Signed   By: Donavan Foil M.D.   On: 11/18/2020 01:55   DG Hip Unilat W or Wo Pelvis 2-3 Views Right  Result Date: 11/18/2020 CLINICAL DATA:  Fall EXAM: DG HIP (WITH OR WITHOUT PELVIS) 2-3V RIGHT COMPARISON:  05/25/2018 FINDINGS: SI joints are non widened. Pubic symphysis and rami are intact. Prior intramedullary rodding of left femur. No acute displaced fracture or malalignment on the right. IMPRESSION: No definite acute osseous abnormality Electronically Signed   By: Donavan Foil M.D.   On: 11/18/2020 01:59   DG Femur Min 2 Views Right  Result Date: 11/18/2020 CLINICAL DATA:  Fall EXAM: RIGHT FEMUR 2 VIEWS COMPARISON:  None. FINDINGS: No fracture or malalignment.  Soft tissues are unremarkable. IMPRESSION: No acute osseous abnormality. Electronically Signed   By: Donavan Foil M.D.   On: 11/18/2020 02:00   CT MAXILLOFACIAL WO CONTRAST  Result Date: 11/18/2020 CLINICAL DATA:  [place], forehead bruising EXAM: CT MAXILLOFACIAL WITHOUT CONTRAST TECHNIQUE: Multidetector CT imaging of the maxillofacial structures was performed. Multiplanar CT image reconstructions were also generated.  COMPARISON:  None. FINDINGS: Osseous: No fracture or mandibular dislocation. No destructive process. Orbits: Negative. No traumatic or inflammatory finding. Sinuses: Clear. Soft tissues: Right supraorbital soft tissue swelling. Remaining soft tissues are unremarkable. Limited intracranial: No significant or unexpected finding. IMPRESSION: 1. Right supraorbital soft tissue swelling. No acute facial bone fracture. Electronically Signed   By: Randa Ngo M.D.   On: 11/18/2020 01:46    Procedures Procedures   Medications Ordered in ED Medications  Tdap (BOOSTRIX) injection 0.5 mL (0.5 mLs Intramuscular Given 11/18/20 0204)    ED Course  I have reviewed the triage vital signs and the nursing notes.  Pertinent labs & imaging results that were available during my care of the patient were reviewed by me and considered in my medical decision making (see chart for details).    MDM Rules/Calculators/A&P                          Patient presents after a ground-level fall.  Patient fell while walking.  She has multiple contusions of the right side of her forehead and face.  CT head, cervical spine, maxillofacial bones do not show any injury other than soft tissue.  Patient complaining of right shoulder pain.  X-ray shows possible nondisplaced distal clavicle fracture.  Patient with contusion of the right thigh area.  X-ray of hip and femur do not show any acute pathology.  Final Clinical Impression(s) / ED Diagnoses Final diagnoses:  Multiple contusions    Rx / DC Orders ED Discharge Orders    None       Mariusz Jubb, Gwenyth Allegra, MD 11/18/20 0231

## 2020-11-18 NOTE — ED Triage Notes (Signed)
Pt states her leg gave out and she fell. Pt denies any loc and she c/o head and shoulder pain.

## 2020-12-02 DEATH — deceased

## 2020-12-27 ENCOUNTER — Ambulatory Visit: Payer: Medicare HMO | Admitting: Nurse Practitioner
